# Patient Record
Sex: Female | Born: 1975
Health system: Southern US, Community
[De-identification: ages and names within clinical notes are randomized; demographics above are authoritative.]

## PROBLEM LIST (undated history)

## (undated) ENCOUNTER — Inpatient Hospital Stay (HOSPITAL_COMMUNITY): Payer: Self-pay

## (undated) DIAGNOSIS — I10 Essential (primary) hypertension: Secondary | ICD-10-CM

## (undated) DIAGNOSIS — R87619 Unspecified abnormal cytological findings in specimens from cervix uteri: Secondary | ICD-10-CM

## (undated) DIAGNOSIS — K589 Irritable bowel syndrome without diarrhea: Secondary | ICD-10-CM

## (undated) DIAGNOSIS — F419 Anxiety disorder, unspecified: Secondary | ICD-10-CM

## (undated) DIAGNOSIS — IMO0002 Reserved for concepts with insufficient information to code with codable children: Secondary | ICD-10-CM

## (undated) DIAGNOSIS — K5792 Diverticulitis of intestine, part unspecified, without perforation or abscess without bleeding: Secondary | ICD-10-CM

## (undated) DIAGNOSIS — Z8619 Personal history of other infectious and parasitic diseases: Secondary | ICD-10-CM

## (undated) DIAGNOSIS — E119 Type 2 diabetes mellitus without complications: Secondary | ICD-10-CM

## (undated) DIAGNOSIS — D219 Benign neoplasm of connective and other soft tissue, unspecified: Secondary | ICD-10-CM

## (undated) HISTORY — PX: COLONOSCOPY: SHX174

## (undated) HISTORY — PX: CYSTECTOMY: SUR359

## (undated) HISTORY — DX: Benign neoplasm of connective and other soft tissue, unspecified: D21.9

## (undated) HISTORY — PX: GYNECOLOGIC CRYOSURGERY: SHX857

## (undated) HISTORY — PX: CARPAL TUNNEL RELEASE: SHX101

## (undated) HISTORY — DX: Essential (primary) hypertension: I10

## (undated) HISTORY — DX: Reserved for concepts with insufficient information to code with codable children: IMO0002

## (undated) HISTORY — DX: Personal history of other infectious and parasitic diseases: Z86.19

## (undated) HISTORY — PX: WISDOM TOOTH EXTRACTION: SHX21

## (undated) HISTORY — PX: COLPOSCOPY: SHX161

## (undated) HISTORY — DX: Unspecified abnormal cytological findings in specimens from cervix uteri: R87.619

## (undated) HISTORY — DX: Irritable bowel syndrome, unspecified: K58.9

---

## 2000-04-28 ENCOUNTER — Other Ambulatory Visit: Admission: RE | Admit: 2000-04-28 | Discharge: 2000-04-28 | Payer: Self-pay | Admitting: Obstetrics and Gynecology

## 2003-08-13 ENCOUNTER — Encounter: Payer: Self-pay | Admitting: Emergency Medicine

## 2003-08-13 ENCOUNTER — Emergency Department (HOSPITAL_COMMUNITY): Admission: EM | Admit: 2003-08-13 | Discharge: 2003-08-13 | Payer: Self-pay | Admitting: Emergency Medicine

## 2003-11-24 DIAGNOSIS — Z8619 Personal history of other infectious and parasitic diseases: Secondary | ICD-10-CM

## 2003-11-24 HISTORY — DX: Personal history of other infectious and parasitic diseases: Z86.19

## 2004-02-06 ENCOUNTER — Other Ambulatory Visit: Admission: RE | Admit: 2004-02-06 | Discharge: 2004-02-06 | Payer: Self-pay | Admitting: Obstetrics and Gynecology

## 2004-08-29 ENCOUNTER — Ambulatory Visit (HOSPITAL_COMMUNITY): Admission: RE | Admit: 2004-08-29 | Discharge: 2004-08-29 | Payer: Self-pay | Admitting: Internal Medicine

## 2005-01-26 ENCOUNTER — Emergency Department (HOSPITAL_COMMUNITY): Admission: EM | Admit: 2005-01-26 | Discharge: 2005-01-26 | Payer: Self-pay | Admitting: *Deleted

## 2005-02-25 ENCOUNTER — Ambulatory Visit (HOSPITAL_COMMUNITY): Admission: RE | Admit: 2005-02-25 | Discharge: 2005-02-25 | Payer: Self-pay | Admitting: Pulmonary Disease

## 2005-03-09 ENCOUNTER — Other Ambulatory Visit: Admission: RE | Admit: 2005-03-09 | Discharge: 2005-03-09 | Payer: Self-pay | Admitting: Obstetrics and Gynecology

## 2005-06-19 ENCOUNTER — Emergency Department (HOSPITAL_COMMUNITY): Admission: EM | Admit: 2005-06-19 | Discharge: 2005-06-19 | Payer: Self-pay | Admitting: Emergency Medicine

## 2005-06-22 ENCOUNTER — Ambulatory Visit (HOSPITAL_COMMUNITY): Admission: RE | Admit: 2005-06-22 | Discharge: 2005-06-22 | Payer: Self-pay | Admitting: Orthopaedic Surgery

## 2005-06-25 ENCOUNTER — Encounter (HOSPITAL_COMMUNITY): Admission: RE | Admit: 2005-06-25 | Discharge: 2005-07-25 | Payer: Self-pay | Admitting: Orthopaedic Surgery

## 2005-08-24 ENCOUNTER — Ambulatory Visit (HOSPITAL_COMMUNITY): Admission: RE | Admit: 2005-08-24 | Discharge: 2005-08-24 | Payer: Self-pay | Admitting: Pulmonary Disease

## 2005-08-27 ENCOUNTER — Ambulatory Visit (HOSPITAL_COMMUNITY): Admission: RE | Admit: 2005-08-27 | Discharge: 2005-08-27 | Payer: Self-pay | Admitting: *Deleted

## 2005-09-04 ENCOUNTER — Ambulatory Visit (HOSPITAL_COMMUNITY): Admission: RE | Admit: 2005-09-04 | Discharge: 2005-09-04 | Payer: Self-pay | Admitting: Pulmonary Disease

## 2005-10-19 ENCOUNTER — Ambulatory Visit: Payer: Self-pay | Admitting: Family Medicine

## 2005-11-30 ENCOUNTER — Ambulatory Visit: Payer: Self-pay | Admitting: Family Medicine

## 2006-04-10 ENCOUNTER — Emergency Department (HOSPITAL_COMMUNITY): Admission: EM | Admit: 2006-04-10 | Discharge: 2006-04-10 | Payer: Self-pay | Admitting: Family Medicine

## 2006-05-17 ENCOUNTER — Ambulatory Visit: Payer: Self-pay | Admitting: Family Medicine

## 2006-05-19 ENCOUNTER — Ambulatory Visit (HOSPITAL_COMMUNITY): Admission: RE | Admit: 2006-05-19 | Discharge: 2006-05-19 | Payer: Self-pay | Admitting: Family Medicine

## 2006-09-28 ENCOUNTER — Ambulatory Visit: Payer: Self-pay | Admitting: Family Medicine

## 2006-10-26 ENCOUNTER — Ambulatory Visit: Payer: Self-pay | Admitting: Family Medicine

## 2007-04-06 ENCOUNTER — Ambulatory Visit (HOSPITAL_COMMUNITY): Admission: RE | Admit: 2007-04-06 | Discharge: 2007-04-06 | Payer: Self-pay | Admitting: Obstetrics and Gynecology

## 2007-04-06 ENCOUNTER — Encounter: Admission: RE | Admit: 2007-04-06 | Discharge: 2007-04-06 | Payer: Self-pay | Admitting: Obstetrics and Gynecology

## 2007-09-21 ENCOUNTER — Ambulatory Visit: Payer: Self-pay | Admitting: Family Medicine

## 2007-09-27 ENCOUNTER — Ambulatory Visit (HOSPITAL_COMMUNITY): Admission: RE | Admit: 2007-09-27 | Discharge: 2007-09-27 | Payer: Self-pay | Admitting: Family Medicine

## 2007-10-01 ENCOUNTER — Encounter: Payer: Self-pay | Admitting: Family Medicine

## 2007-10-01 LAB — CONVERTED CEMR LAB
BUN: 11 mg/dL (ref 6–23)
Basophils Absolute: 0 10*3/uL (ref 0.0–0.1)
Basophils Relative: 0 % (ref 0–1)
Blood Glucose, Fasting: 97 mg/dL
CO2: 23 meq/L (ref 19–32)
Calcium: 9.2 mg/dL (ref 8.4–10.5)
Chloride: 108 meq/L (ref 96–112)
Cholesterol: 140 mg/dL (ref 0–200)
Creatinine, Ser: 0.85 mg/dL (ref 0.40–1.20)
Eosinophils Absolute: 0.1 10*3/uL (ref 0.0–0.7)
Eosinophils Relative: 2 % (ref 0–5)
Glucose, Bld: 97 mg/dL (ref 70–99)
HCT: 43.5 % (ref 36.0–46.0)
HDL: 30 mg/dL — ABNORMAL LOW (ref >39)
Hemoglobin: 14.4 g/dL (ref 12.0–15.0)
LDL Cholesterol: 88 mg/dL (ref 0–99)
Lymphocytes Relative: 34 % (ref 12–46)
Lymphs Abs: 2.6 10*3/uL (ref 0.7–3.3)
MCHC: 33.1 g/dL (ref 30.0–36.0)
MCV: 87.2 fL (ref 78.0–100.0)
Monocytes Absolute: 0.6 10*3/uL (ref 0.2–0.7)
Monocytes Relative: 7 % (ref 3–11)
Neutro Abs: 4.2 10*3/uL (ref 1.7–7.7)
Neutrophils Relative %: 56 % (ref 43–77)
Platelets: 243 10*3/uL (ref 150–400)
Potassium: 4.4 meq/L (ref 3.5–5.3)
RBC count: 4.99 10*6/uL
RBC: 4.99 M/uL (ref 3.87–5.11)
RDW: 13.7 % (ref 11.5–14.0)
Sodium: 141 meq/L (ref 135–145)
TSH: 1.733 microintl units/mL
TSH: 1.733 microintl units/mL (ref 0.350–5.50)
Total CHOL/HDL Ratio: 4.7
Triglycerides: 108 mg/dL (ref <150)
VLDL: 22 mg/dL (ref 0–40)
WBC, blood: 7.5 10*3/uL
WBC: 7.5 10*3/uL (ref 4.0–10.5)

## 2007-11-03 ENCOUNTER — Ambulatory Visit: Payer: Self-pay | Admitting: Family Medicine

## 2007-12-19 ENCOUNTER — Encounter: Payer: Self-pay | Admitting: Family Medicine

## 2007-12-19 DIAGNOSIS — E669 Obesity, unspecified: Secondary | ICD-10-CM

## 2007-12-19 DIAGNOSIS — J019 Acute sinusitis, unspecified: Secondary | ICD-10-CM

## 2007-12-19 DIAGNOSIS — J209 Acute bronchitis, unspecified: Secondary | ICD-10-CM

## 2007-12-19 DIAGNOSIS — F411 Generalized anxiety disorder: Secondary | ICD-10-CM

## 2008-08-28 ENCOUNTER — Ambulatory Visit: Payer: Self-pay | Admitting: Gastroenterology

## 2008-08-28 ENCOUNTER — Encounter: Payer: Self-pay | Admitting: Family Medicine

## 2008-08-28 ENCOUNTER — Ambulatory Visit (HOSPITAL_COMMUNITY): Admission: RE | Admit: 2008-08-28 | Discharge: 2008-08-28 | Payer: Self-pay | Admitting: Gastroenterology

## 2008-08-30 ENCOUNTER — Ambulatory Visit (HOSPITAL_COMMUNITY): Admission: RE | Admit: 2008-08-30 | Discharge: 2008-08-30 | Payer: Self-pay | Admitting: Gastroenterology

## 2008-09-11 ENCOUNTER — Encounter: Payer: Self-pay | Admitting: Family Medicine

## 2009-02-20 ENCOUNTER — Ambulatory Visit: Payer: Self-pay | Admitting: Family Medicine

## 2009-02-20 DIAGNOSIS — H9209 Otalgia, unspecified ear: Secondary | ICD-10-CM | POA: Insufficient documentation

## 2009-02-21 ENCOUNTER — Encounter: Payer: Self-pay | Admitting: Family Medicine

## 2009-02-21 LAB — CONVERTED CEMR LAB
Basophils Absolute: 0 10*3/uL (ref 0.0–0.1)
Basophils Relative: 0 % (ref 0–1)
Eosinophils Absolute: 0.2 10*3/uL (ref 0.0–0.7)
Eosinophils Relative: 1 % (ref 0–5)
HCT: 43.3 % (ref 36.0–46.0)
Hemoglobin: 14.4 g/dL (ref 12.0–15.0)
Lymphocytes Relative: 28 % (ref 12–46)
Lymphs Abs: 3.3 10*3/uL (ref 0.7–4.0)
MCHC: 33.3 g/dL (ref 30.0–36.0)
MCV: 86.1 fL (ref 78.0–100.0)
Monocytes Absolute: 0.9 10*3/uL (ref 0.1–1.0)
Monocytes Relative: 7 % (ref 3–12)
Neutro Abs: 7.5 10*3/uL (ref 1.7–7.7)
Neutrophils Relative %: 63 % (ref 43–77)
Platelets: 267 10*3/uL (ref 150–400)
RBC: 5.03 M/uL (ref 3.87–5.11)
RDW: 13.5 % (ref 11.5–15.5)
TSH: 1.636 microintl units/mL (ref 0.350–4.500)
WBC: 11.8 10*3/uL — ABNORMAL HIGH (ref 4.0–10.5)

## 2009-03-20 ENCOUNTER — Ambulatory Visit (HOSPITAL_COMMUNITY): Admission: RE | Admit: 2009-03-20 | Discharge: 2009-03-20 | Payer: Self-pay | Admitting: General Surgery

## 2009-03-20 ENCOUNTER — Encounter (INDEPENDENT_AMBULATORY_CARE_PROVIDER_SITE_OTHER): Payer: Self-pay | Admitting: General Surgery

## 2009-12-04 ENCOUNTER — Ambulatory Visit: Payer: Self-pay | Admitting: Family Medicine

## 2009-12-04 DIAGNOSIS — R112 Nausea with vomiting, unspecified: Secondary | ICD-10-CM

## 2009-12-04 LAB — CONVERTED CEMR LAB
BUN: 8 mg/dL (ref 6–23)
Basophils Absolute: 0 10*3/uL (ref 0.0–0.1)
Basophils Relative: 1 % (ref 0–1)
CO2: 28 meq/L (ref 19–32)
Calcium: 8.9 mg/dL (ref 8.4–10.5)
Chloride: 105 meq/L (ref 96–112)
Creatinine, Ser: 0.78 mg/dL (ref 0.40–1.20)
Eosinophils Absolute: 0 10*3/uL (ref 0.0–0.7)
Eosinophils Relative: 0 % (ref 0–5)
Glucose, Bld: 105 mg/dL — ABNORMAL HIGH (ref 70–99)
HCT: 38.9 % (ref 36.0–46.0)
Hemoglobin: 13.2 g/dL (ref 12.0–15.0)
Lymphocytes Relative: 5 % — ABNORMAL LOW (ref 12–46)
Lymphs Abs: 0.4 10*3/uL — ABNORMAL LOW (ref 0.7–4.0)
MCHC: 34 g/dL (ref 30.0–36.0)
MCV: 87.3 fL (ref 78.0–100.0)
Monocytes Absolute: 0.4 10*3/uL (ref 0.1–1.0)
Monocytes Relative: 5 % (ref 3–12)
Neutro Abs: 6.2 10*3/uL (ref 1.7–7.7)
Neutrophils Relative %: 89 % — ABNORMAL HIGH (ref 43–77)
Platelets: 193 10*3/uL (ref 150–400)
Potassium: 4.2 meq/L (ref 3.5–5.3)
RBC: 4.46 M/uL (ref 3.87–5.11)
RDW: 13.3 % (ref 11.5–15.5)
Sodium: 140 meq/L (ref 135–145)
WBC: 7 10*3/uL (ref 4.0–10.5)

## 2010-03-03 ENCOUNTER — Ambulatory Visit: Payer: Self-pay | Admitting: Orthopedic Surgery

## 2010-03-03 DIAGNOSIS — M7580 Other shoulder lesions, unspecified shoulder: Secondary | ICD-10-CM

## 2010-03-03 DIAGNOSIS — M75120 Complete rotator cuff tear or rupture of unspecified shoulder, not specified as traumatic: Secondary | ICD-10-CM

## 2010-03-04 ENCOUNTER — Telehealth: Payer: Self-pay | Admitting: Orthopedic Surgery

## 2010-03-06 ENCOUNTER — Ambulatory Visit (HOSPITAL_COMMUNITY): Admission: RE | Admit: 2010-03-06 | Discharge: 2010-03-06 | Payer: Self-pay | Admitting: Orthopedic Surgery

## 2010-03-27 ENCOUNTER — Ambulatory Visit: Payer: Self-pay | Admitting: Orthopedic Surgery

## 2010-12-23 NOTE — Letter (Signed)
Summary: Out of Work  [facility]  [address]   [place], [place] [address]   Phone: [phone]  Fax: [phone]    December 04, 2009   Employee:  TACHE BOBST    To Whom It May Concern:   For Medical reasons, please excuse the above named employee from work for the following dates:  Start:   12/04/09  End:   12/09/09 to return with no restrictions  If you need additional information, please feel free to contact our office.         Sincerely,    Milus Mallick. Lodema Hong, MD

## 2010-12-23 NOTE — Assessment & Plan Note (Signed)
Summary: rt shoulder pain/NEEDS XRAY/UMR/frs   Vital Signs:  Patient profile:   35 year old female Menstrual status:  regular Height:      67 inches Weight:      180 pounds Pulse rate:   72 / minute Resp:     16 per minute  Vitals Entered By: Fuller Canada MD (March 03, 2010 9:33 AM)  Visit Type:  new patient Referring Provider:  self Primary Provider:  Kerri Perches, MD  CC:  right shoulder pain.  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  She states she can't lift overhead her pain is 5/10 comes and goes sharp at times dull at times.  Has progressed gradually over time.  She can't reach behind her she can't reach overhead.  She has catching before elevation and has numbness and tingling in the upper arm.  She also has some neck pain and pain in the trapezius area.  She can't sleep on her RIGHT arm.   Meds: Diethylpropion daily.      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 Cardiovascular:  Denies chest pain, palpitations, fainting, and murmurs. Respiratory:  Denies short of breath, wheezing, couch, tightness, pain on inspiration, and snoring . Gastrointestinal:  Denies heartburn, nausea, vomiting, diarrhea, constipation, and blood in your stools. Genitourinary:  Denies frequency, urgency, difficulty urinating, painful urination, flank  pain, and bleeding in urine. Neurologic:  Complains of numbness and tingling; denies unsteady gait, dizziness, tremors, and seizure; right hand. Musculoskeletal:  Complains of joint pain; denies swelling, instability, stiffness, redness, heat, and muscle pain. Endocrine:  Denies excessive thirst, exessive urination, and heat or cold intolerance. Psychiatric:  Denies nervousness, depression, anxiety, and hallucinations. Skin:  Denies changes in the skin, poor healing, rash, itching, and redness. HEENT:  Denies blurred or double vision, eye pain, redness, and watering. Immunology:  Denies seasonal allergies, sinus problems, and allergic to bee stings. Hemoatologic:  Denies easy bleeding and brusing.  Physical Exam  Additional Exam:  GEN: well developed, well nourished, normal grooming and hygiene, no deformity and normal body habitus.   CDV: pulses are normal, no edema, no erythema. no tenderness  Lymph: normal lymph nodes   Skin: no rashes, skin lesions or open sores   NEURO: normal coordination, reflexes, sensation.   Psyche: awake, alert and oriented. Mood normal   Gait: normal  LEFT shoulder inspection normal, range of motion full, strength normal, shoulder stable  RIGHT shoulder tenderness in the RIGHT trapezius muscle, full passive range of motion.  Active range of motion 150 of flexion.  Mild weakness in external rotation and in the supraspinatus tendon.the shoulder was stable  A positive Neer sign for impingement.  Hawkins sign was also positive.  cervical spine was nontender.  Full flexion extension no pain.  Had good external rotation to the LEFT and decreased to the RIGHT.  Compression test test was negative for radicular disease.  Spurling sign negative.   Impression &  Recommendations:  Problem # 1:  IMPINGEMENT SYNDROME (ICD-726.2) Assessment New  2 views of the glenohumeral joint   The glenohumeral joint space is normal. The bony anatomy is without bone  lesion. The acromion is a Type II  normal shoulder   Orders: New Patient Level IV (62952)  Problem # 2:  RUPTURE ROTATOR CUFF (ICD-727.61) Assessment: New  She either has a torn cuff or impingement syndrome  Would recommend some anti-inflammatories to start MRI to rule out cuff tear and then inject if no cuff tear along with physical therapy  Orders: New Patient Level IV (84132)  Patient Instructions: 1)  MRI right shoulder  2)  return in 2 weeks  3)  try some aleve or advil OTC

## 2010-12-23 NOTE — Assessment & Plan Note (Signed)
Summary: REVIEW MRI RESULTS/RT SHOULDER/[facility]/UMR/CAF   Visit Type:  MRI followup Referring Provider:  self Primary Provider:  Kerri Perches, MD  CC:  right shoulder pain.  History of Present Illness: The patient is here to repeat check her shoulder did an injection and followup on her MRI  Painful range of motion in the RIGHT shoulder MRI shows no tear  We did a subacromial injection RIGHT shoulder she is advised to anticipate an exercise program she would like to do at home  Allergies: No Known Drug Allergies   Impression & Recommendations:  Problem # 1:  IMPINGEMENT SYNDROME (ICD-726.2) Assessment Unchanged  Verbal consent obtained/The shoulder was injected with depomedrol 40mg /cc and sensorcaine .25% . There were no complications  Orders: Est. Patient Level III (43154) Joint Aspirate / Injection, Large (20610) Depo- Medrol 40mg  (J1030)  Patient Instructions: 1)  You have received an injection of cortisone today. You may experience increased pain at the injection site. Apply ice pack to the area for 20 minutes every 2 hours and take 2 xtra strength tylenol every 8 hours. This increased pain will usually resolve in 24 hours. The injection will take effect in 3-10 days.  2)  6 weeks  3)  re check

## 2010-12-23 NOTE — Miscellaneous (Signed)
Summary: PT order  PT order   Imported By: Cammie Sickle 03/27/2010 14:25:25  _____________________________________________________________________  External Attachment:    Type:   Image     Comment:   External Document

## 2010-12-23 NOTE — Assessment & Plan Note (Signed)
Summary: office visit   Vital Signs:  Patient profile:   35 year old female Menstrual status:  regular Height:      66.5 inches Weight:      201.2 pounds BMI:     32.10 Temp:     99.2 degrees F oral Pulse rate:   106 / minute Pulse rhythm:   regular Resp:     16 per minute BP sitting:   130 / 90  (right arm)  Vitals Entered By: Worthy Keeler LPN (December 04, 2009 2:09 PM)  Nutrition Counseling: Patient's BMI is greater than 25 and therefore counseled on weight management options.  Primary Care Provider:  Kerri Perches, MD   History of Present Illness: 3 day h/uo sinus pressure, headache sore throat and cough, sometimes producitive of yellow sputum, chills and low grade fever.Nasal drainagee is clear.  weak and achy.  front of chest hurts when she breathes  this morning at 3am pt std having nausea, she has vomitted 3 times, no loose stool,actually took dulcolax yesterday  because of constoipation. the pt reports a 50 pound weight loss since being on amed prescribed by her psyhiatrist, states it works as an excellent apetite suppressant, and she has been exercising regularly also. She no longer takes meds for anxiety.   Allergies: No Known Drug Allergies  Review of Systems      See HPI General:  Complains of chills, fatigue, fever, loss of appetite, malaise, weakness, and weight loss. Eyes:  Denies blurring and discharge. ENT:  Complains of postnasal drainage and sinus pressure. CV:  Denies chest pain or discomfort, palpitations, and swelling of feet. Resp:  Complains of cough and sputum productive. GI:  Complains of abdominal pain, nausea, and vomiting; denies constipation and diarrhea. GU:  Denies dysuria and urinary frequency. MS:  Denies joint pain and stiffness. Derm:  Denies itching and rash. Neuro:  Denies headaches and seizures. Psych:  Complains of anxiety; denies depression; chronic, on no meds. Endo:  Denies cold intolerance, excessive hunger, excessive  thirst, excessive urination, heat intolerance, polyuria, and weight change. Heme:  Denies abnormal bruising and bleeding. Allergy:  Denies hives or rash and itching eyes.  Physical Exam  General:  Well-developed,well-nourished,ill appearing, but in no cardiopulmonary distress. Alert, uncomfortable due to excessive nausea. HEENT: No facial asymmetry,  EOMI, frontal and maxillary sinus tenderness, TM's Clear, oropharynx  pink and moist.   Chest:decreased air entry , bilateral crackles , no wheezes CVS: S1, S2, No murmurs, No S3.   Abd: Soft, diffuse superficial tenderness, hyperactive bowel sounds  MS: Adequate ROM spine, hips, shoulders and knees.  Ext: No edema.   CNS: CN 2-12 intact, power tone and sensation normal throughout.   Skin: Intact, no visible lesions or rashes.  Psych: Good eye contact, normal affect.  Memory intact, anxious appearing..     Impression & Recommendations:  Problem # 1:  NAUSEA WITH VOMITING (ICD-787.01) Assessment Comment Only  Orders: Zofran 1mg . injection (Z6109) Admin of Therapeutic Inj  intramuscular or subcutaneous (60454) T-CBC w/Diff (09811-91478) Admin of Therapeutic Inj  intramuscular or subcutaneous (29562)  Problem # 2:  OBESITY, UNSPECIFIED (ICD-278.00) Assessment: Improved  Ht: 66.5 (12/04/2009)   Wt: 201.2 (12/04/2009)   BMI: 32.10 (12/04/2009)  Problem # 3:  BRONCHITIS, ACUTE (ICD-466.0) Assessment: Comment Only  Her updated medication list for this problem includes:    Zithromax Z-pak 250 Mg Tabs (Azithromycin) ..... Use as directed    Tessalon Perles 100 Mg Caps (Benzonatate) .Marland Kitchen... Take 1 capsule  by mouth three times a day  Problem # 4:  SINUSITIS, ACUTE (ICD-461.9) Assessment: Comment Only  Her updated medication list for this problem includes:    Zithromax Z-pak 250 Mg Tabs (Azithromycin) ..... Use as directed    Tessalon Perles 100 Mg Caps (Benzonatate) .Marland Kitchen... Take 1 capsule by mouth three times a day  Orders: T-Basic  Metabolic Panel [phone]) Rocephin  250mg  (J4782)  Complete Medication List: 1)  Citalopram Hydrobromide 10 Mg Tabs (Citalopram hydrobromide) .... Take 1 tablet by mouth once a day 2)  Diflucan 100 Mg Tabs (Fluconazole) .... Take 1 tablet by mouth once a day 3)  Zithromax Z-pak 250 Mg Tabs (Azithromycin) .... Use as directed 4)  Tessalon Perles 100 Mg Caps (Benzonatate) .... Take 1 capsule by mouth three times a day 5)  Fluconazole 150 Mg Tabs (Fluconazole) .... Take 1 tablet by mouth once a day as needed 6)  Promethazine Hcl 25 Mg Tabs (Promethazine hcl) .... Take 1 tablet by mouth three times a day as needed  Patient Instructions: 1)  F/u as needed. 2)  You are being treated for acute sinusitis and bronchitis. 3)  you also have symptoms of early stomach virus. 4)  You have received rocephin and Zofran. 5)  meds are being sent to your pharmacy. 6)     7)  the main problem with gastroenteritis is dehydration. Drink plenty of fluids and take solids as you feel better. If you are unable to keep anything down and/or you show signs of dehydration(dry/cracked lips, lack of tears, not urinating, very sleepy), call our office. Prescriptions: PROMETHAZINE HCL 25 MG TABS (PROMETHAZINE HCL) Take 1 tablet by mouth three times a day as needed  #30 x 0   Entered and Authorized by:   Syliva Overman MD   Signed by:   Syliva Overman MD on 12/04/2009   Method used:   Electronically to        [facility] (retail)       [address]       [place], [place]  [address]       Ph: [phone] or [phone]       Fax: [phone]   RxID:   [id number] FLUCONAZOLE 150 MG TABS (FLUCONAZOLE) Take 1 tablet by mouth once a day as needed  #3 x 0   Entered and Authorized by:   Syliva Overman MD   Signed by:   Syliva Overman MD on 12/04/2009   Method used:   Electronically to        [facility] (retail)       [address]       [place], [place]  [address]       Ph: [phone] or [phone]       Fax: [phone]   RxID:   [id number] TESSALON PERLES 100 MG CAPS (BENZONATATE) Take 1 capsule by mouth three times a day  #30 x 0   Entered and Authorized by:   Syliva Overman MD   Signed by:   Syliva Overman MD on 12/04/2009   Method used:   Electronically to        [facility] (retail)       [address]       [place], [place]  [address]       Ph: [phone] or [phone]  Fax: [phone]   RxID:   [id number] ZITHROMAX Z-PAK 250 MG TABS (AZITHROMYCIN) Use as directed  #6 x 0   Entered and Authorized by:   Syliva Overman MD   Signed by:   Syliva Overman MD on 12/04/2009   Method used:   Electronically to        [facility] (retail)       [address]       [place], [place]  [address]       Ph: [phone] or [phone]       Fax: [phone]   RxID:   [id number]    Medication Administration  Injection # 1:    Medication: Zofran 1mg . injection    Diagnosis: NAUSEA WITH VOMITING (ICD-787.01)    Route: IM    Site: RUOQ gluteus    Exp Date: 6/11    Lot #: [id number]    Mfr: [email or web address]    Comments: zofran 4mg  given    Patient tolerated injection without complications    Given by: Worthy Keeler LPN (December 04, 2009 1:24 PM)  Injection # 2:    Medication: Rocephin  250mg     Diagnosis: SINUSITIS, ACUTE (ICD-461.9)    Route: IM    Site: L deltoid    Exp Date: 9/12    Lot #: [id number]    Mfr: [email or web address]    Comments: rocephin 500mg  given    Patient tolerated injection without complications    Given by: Worthy Keeler LPN (December 04, 2009 2:08 PM)  Orders Added: 1)  Zofran 1mg . injection [J2405] 2)  Admin of Therapeutic Inj  intramuscular or subcutaneous [96372] 3)  Est. Patient Level IV [63016] 4)  T-Basic Metabolic Panel [[id number]] 5)  T-CBC w/Diff [[id number]] 6)  Rocephin  250mg  [J0696] 7)  Admin  of Therapeutic Inj  intramuscular or subcutaneous [32202]

## 2010-12-23 NOTE — Progress Notes (Signed)
Summary: MRI appointment.  Phone Note Outgoing Call   Call placed by: Waldon Reining,  March 04, 2010 11:39 AM Call placed to: Patient Action Taken: Appt scheduled Summary of Call: I called to give the patient her MRI appointment at [facility] on 03-06-10 at 3:30. Patient has [facility] UMR, no precert is needed. She will follow up back here to go over her results.

## 2010-12-23 NOTE — Letter (Signed)
Summary: History form  History form   Imported By: Jacklynn Ganong 03/04/2010 13:43:03  _____________________________________________________________________  External Attachment:    Type:   Image     Comment:   External Document

## 2010-12-27 ENCOUNTER — Inpatient Hospital Stay (INDEPENDENT_AMBULATORY_CARE_PROVIDER_SITE_OTHER)
Admission: RE | Admit: 2010-12-27 | Discharge: 2010-12-27 | Disposition: A | Discharge status: Home / Self Care | Payer: Commercial Managed Care - PPO | Source: Ambulatory Visit | Attending: Family Medicine | Admitting: Family Medicine

## 2010-12-27 DIAGNOSIS — N390 Urinary tract infection, site not specified: Secondary | ICD-10-CM

## 2010-12-27 LAB — POCT PREGNANCY, URINE: Preg Test, Ur: NEGATIVE

## 2010-12-30 LAB — URINE CULTURE
Colony Count: >=100000
Culture  Setup Time: 201202050109

## 2011-01-01 LAB — POCT URINALYSIS DIPSTICK
Hgb urine dipstick: NEGATIVE
Nitrite: POSITIVE — AB
Protein, ur: 30 mg/dL — AB
Specific Gravity, Urine: 1.015 (ref 1.005–1.030)
Urine Glucose, Fasting: 100 mg/dL — AB
Urobilinogen, UA: 4 mg/dL — ABNORMAL HIGH (ref 0.0–1.0)
pH: 7 (ref 5.0–8.0)

## 2011-03-04 LAB — BASIC METABOLIC PANEL
BUN: 9 mg/dL (ref 6–23)
CO2: 26 mEq/L (ref 19–32)
Calcium: 9.2 mg/dL (ref 8.4–10.5)
Chloride: 108 mEq/L (ref 96–112)
Creatinine, Ser: 0.69 mg/dL (ref 0.4–1.2)
GFR calc Af Amer: >60 mL/min (ref >60)
GFR calc non Af Amer: >60 mL/min (ref >60)
Glucose, Bld: 108 mg/dL — ABNORMAL HIGH (ref 70–99)
Potassium: 4.1 mEq/L (ref 3.5–5.1)
Sodium: 139 mEq/L (ref 135–145)

## 2011-03-04 LAB — CBC
HCT: 41.8 % (ref 36.0–46.0)
Hemoglobin: 14.7 g/dL (ref 12.0–15.0)
MCHC: 35.2 g/dL (ref 30.0–36.0)
MCV: 87.3 fL (ref 78.0–100.0)
Platelets: 264 10*3/uL (ref 150–400)
RBC: 4.79 MIL/uL (ref 3.87–5.11)
RDW: 13.2 % (ref 11.5–15.5)
WBC: 8.7 10*3/uL (ref 4.0–10.5)

## 2011-03-04 LAB — HCG, QUANTITATIVE, PREGNANCY: hCG, Beta Chain, Quant, S: <2 m[IU]/mL (ref <5)

## 2011-04-07 NOTE — H&P (Signed)
NAMESEMA, STANGLER            ACCOUNT NO.:  [id number]   MEDICAL RECORD NO.:  [id number]          PATIENT TYPE:  AMB   LOCATION:  DAY                           FACILITY:  [facility]   PHYSICIAN:  Kassie Mends, M.D.      DATE OF BIRTH:  29-Aug-1976   DATE OF ADMISSION:  08/28/2008  DATE OF DISCHARGE:  LH                              HISTORY & PHYSICAL   PRIMARY CARE PHYSICIAN:  Dr. Lodema Hong.   REASON FOR CONSULTATION:  Proctalgia and hematochezia.   HISTORY OF PRESENT ILLNESS:  Ms. [name] is a 35 year old Caucasian  female who has had a 2-month history of constipation and straining with  stool.  She notes hard stools and dry stools.  She has been noticing  intermittent small-volume hematochezia on the toilet paper with wiping.  She has noted problems with hygiene.  She denies any anorexia and has  intentionally lost about 60 pounds in the last 8 months however, she has  gained about 10 pounds back.  She denies any heartburn or indigestion.  Denies any nausea or vomiting.  Denies any dysphagia or odynophagia.  She has tried fiber bars for her constipation.  She has tried Imodium  previously when she would have diarrhea.  She has a past history of IBS  and has had very irregular bowel movements, especially when she goes out  to eat.  She has had a history of postprandial diarrhea with anywhere  from three to four loose stools per day at times.  She has tried senna  and MiraLax and stool softeners, but admits to not taking any of these  medications on a regular basis.  She has had some low abdominal cramps  which are usually made better with defecation.   PAST MEDICAL AND SURGICAL HISTORY:  1. 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  2. She has history of IBS.  3. She has history of a possible PCOS.  4. She had Salmonella in 2001 and Campylobacter in 2004.  5. She has had  wisdom teeth extraction.   CURRENT MEDICATIONS:  Hydrocortisone suppositories   ALLERGIES:  No known drug allergies.   FAMILY HISTORY:  The mother is being worked up for possible Crohn  disease by R. Roetta Sessions, M.D.  She had a colonoscopy last week and  had have ulcers at her ICV and has history of ischemic colitis.  Father  at age 65 has history of AFib.  She has one healthy sister.  One brother  with Prader-Willi syndrome and diabetes mellitus.   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.  She  lives alone.  She denies any tobacco, alcohol or drug use.   REVIEW OF SYSTEMS:  See HPI.  GYN:  Last menstrual period was August 04, 2008.  More recently she has been regular, but has history of  irregular cycles.  Otherwise negative review of systems.   PHYSICAL EXAMINATION:  VITAL SIGNS:  Weight 225 pounds,  height 67  inches, temperature 98 degrees, blood pressure 130/80 and pulse 88.  GENERAL:  She is an obese Caucasian female who is alert, normal,  oriented and quite anxious in no acute distress.  She is accompanied by  her mother today.  HEENT.  Sclerae clear, nonicteric.  Conjunctivae are pink.  Oropharynx  is pink and moist without any lesions.  NECK:  Supple without mass or thyromegaly.  CHEST:  Heart regular rate and rhythm.  Normal S1-S2 without murmurs,  clicks, rubs or gallops.  LUNGS:  Clear to auscultation bilaterally.  ABDOMEN:  Protuberant with positive bowel sounds x4.  No bruits  auscultated.  She does have several freely mobile 1-2 cm palpable  round/oval non-fixed lesions in the lower pelvis felt to be possible  lipomas.  RECTAL:  She does have a large external fissure.  Internal exam reveals  a palpable fullness approximately 3-5 cm from sphincter.  It is  nontender.  A small amount of stool was obtained from the vault which is  Hemoccult positive  EXTREMITIES:  Without clubbing or edema.   IMPRESSION:  Ms. [name] is a 35 year old  Caucasian female with history  of irritable bowel syndrome.  She has history of alternating  constipation and diarrhea.  More recently approximately 2 months ago she  developed significant constipation and the sensation of straining and  obstipation with defecation.  She has had significant intentional weight  loss.  She is also noticed intermittent hematochezia.  She does have  external fissure which could be the culprit of her hematochezia,  however, I do feel that she is going to require a flexible sigmoidoscopy  to further evaluate her hematochezia and possible rectal lesion.  I have  discussed this case with Kassie Mends, M.D. and our plan of care is  outlined below.   PLAN:  1. She will have a flexible sigmoidoscopy with Dr. Cira Servant.  I have      discussed the procedure including risks, benefits to include, but      are limited to bleeding, infection, perforation and drug reaction      if she decides to have sedation.  2. She is going to [facility] endoscopy now for procedure.  3. She should continue Anusol suppositories.  4. Further recommendations pending procedure.      Lorenza Burton, N.P.      Kassie Mends, M.D.  Electronically Signed    KJ/MEDQ  D:  08/28/2008  T:  08/28/2008  Job:  [id number]   cc:   Milus Mallick. Lodema Hong, M.D.  Fax: [phone]

## 2011-04-07 NOTE — Op Note (Signed)
NAMEHERMELA, Cynthia Ochoa            ACCOUNT NO.:  [id number]   MEDICAL RECORD NO.:  [id number]          PATIENT TYPE:  AMB   LOCATION:  DAY                           FACILITY:  [facility]   PHYSICIAN:  Kassie Mends, M.D.      DATE OF BIRTH:  04-10-1976   DATE OF PROCEDURE:  08/28/2008  DATE OF DISCHARGE:                               OPERATIVE REPORT   PROCEDURE:  Sigmoidoscopy.   INDICATION FOR EXAMINATION:  [name] is a 35 year old female who  presents with history of anal fissures, hemorrhoid, and irritable bowel  syndrome.  She presented with rectal bleeding.  On physical exam, bulge  in her rectum and anterior bulge was noted on palpation.   FINDINGS:  1. Two small anal fissures seen in the anal canal.  No internal or      external hemorrhoids appreciated.  2. Normal retroflexed view of the rectum.  Normal sigmoidoscopy      without evidence of polyps, mass, inflammatory changes, or      diverticular AVMs.   DIAGNOSES:  1. Rectal bleeding secondary to anal fissures.  2. Rectal bulge likely secondary to enlarged uterus.   RECOMMENDATIONS:  1. The patient should avoid aspirin for 14 days.  2. She should drink 6-8 cups of water daily.  3. Anusol-HC 1 per rectum twice daily for 10 days.  4. Milk of magnesia 2 tablets twice daily.  5. Fiber supplement once daily.  In 3-4 weeks, she should begin a high-      fiber diet and she will be given a handout on high-fiber diet.  6. Follow up appointment in 4 weeks with Dr. Cira Servant RE: anal fissure.      She may need to consider a fissurectomy.   MEDICATIONS:  1. Demerol 100 mg IV.  2. Versed 4 mg IV.   PROCEDURE TECHNIQUE:  Physical exam was performed.  Informed consent was  obtained from the patient after explaining the benefits, risks, and  alternatives to the procedure.  The patient was connected to the monitor  and placed in left lateral position.  Continuous oxygen was provided by  nasal cannula and IV medicine administered  through an indwelling  cannula.  After administration of sedation and rectal exam, the  patient's rectum was intubated  and the scope was advanced under direct visualization to 40-50 cm beyond  the anal verge.  The scope was removed slowly by carefully examining the  color, texture, anatomy, and integrity of the mucosa on the way out.  The patient recovered in endoscopy and discharged home in satisfactory  condition.      Kassie Mends, M.D.  Electronically Signed     SM/MEDQ  D:  08/28/2008  T:  08/29/2008  Job:  [id number]   cc:   Milus Mallick. Lodema Hong, M.D.  Fax: [phone]

## 2011-04-07 NOTE — Op Note (Signed)
NAMEGERMANI, Cynthia Ochoa            ACCOUNT NO.:  [id number]   MEDICAL RECORD NO.:  [id number]          PATIENT TYPE:  AMB   LOCATION:  DAY                           FACILITY:  [facility]   PHYSICIAN:  Dalia Heading, M.D.  DATE OF BIRTH:  Nov 28, 1975   DATE OF PROCEDURE:  03/20/2009  DATE OF DISCHARGE:                               OPERATIVE REPORT   PREOPERATIVE DIAGNOSIS:  Sebaceous cyst, abdominal wall.   POSTOPERATIVE DIAGNOSES:  Sebaceous cyst, abdominal wall; granuloma.   PROCEDURE:  Excision of cyst, abdominal wall, 2.5 cm.   SURGEON:  Dalia Heading, MD   ANESTHESIA:  General.   INDICATIONS:  The patient is a 35 year old white female, who presents  with a draining sebaceous cyst on the abdominal wall.  It is in the  suprapubic region.  It has been present for many months and continues to  drain serosanguineous fluid.  The risks and benefits of the procedure  were fully explained to the patient, gave informed consent.   PROCEDURE NOTE:  The patient was placed in supine position.  After  general anesthesia was administered, the suprapubic region was prepped  and draped using the usual sterile technique with Betadine.  Surgical  site confirmation was performed.   Just the left of the suprapubic region along a skin fold, an indurated  area was present.  Elliptical incision was made around this region and  skin along with subcutaneous tissue was excised.  Care was taken not to  get into the granuloma.  The specimen sent to Pathology for further  examination.  Bleeding was controlled using Bovie electrocautery.  The  skin was instilled with 0.5 mL Sensorcaine.  The subcutaneous layer was  reapproximated using 4-0 Vicryl interrupted sutures.  The skin was  closed using a 4-0 Vicryl subcuticular suture.  Dermabond was then  applied.   All tape and needle counts correct at the end of procedure.  The patient  was awakened and transferred to PACU in stable condition.   COMPLICATIONS:  None.   SPECIMEN:  Granuloma, abdominal wall.   ESTIMATED BLOOD LOSS:  Minimal.      Dalia Heading, M.D.  Electronically Signed     MAJ/MEDQ  D:  03/20/2009  T:  03/20/2009  Job:  [id number]

## 2011-04-07 NOTE — H&P (Signed)
NAME:  Cynthia Ochoa, Cynthia Ochoa            ACCOUNT NO.:  [id number]   MEDICAL RECORD NO.:  [id number]          PATIENT TYPE:  AMB   LOCATION:  DAY                           FACILITY:  [facility]   PHYSICIAN:  Dalia Heading, M.D.  DATE OF BIRTH:  Apr 28, 1976   DATE OF ADMISSION:  DATE OF DISCHARGE:  LH                              HISTORY & PHYSICAL   CHIEF COMPLAINT:  Recurrent sebaceous cyst, trunk.   HISTORY OF PRESENT ILLNESS:  The patient is a 35 year old white female  who presents with recurrent sebaceous cyst on the lower aspect of her  abdomen.  It has been present for many months and continues to drain  serosanguineous fluid.  The patient now comes to the operating room for  excision of the cyst.   PAST MEDICAL HISTORY:  Unremarkable.   PAST SURGICAL HISTORY:  Unremarkable.   CURRENT MEDICATIONS:  None.   ALLERGIES:  No known drug allergies.   REVIEW OF SYSTEMS:  Noncontributory.   PHYSICAL EXAMINATION:  GENERAL:  The patient is a well-developed, well-  nourished, white female in no acute distress.  LUNGS:  Clear to auscultation with equal breath sounds bilaterally.  HEART:  Reveals regular rate and rhythm without S3, S4, or murmurs.  ABDOMEN:  Soft, nontender, nondistended.  A draining sinus with  induration is noted in the left lower portion of the anterior abdominal  wall.   IMPRESSION:  Sebaceous cyst, recurrent, abdominal wall.   PLAN:  The patient is scheduled for excision of the sebaceous cyst,  abdominal wall, on March 20, 2009.  The risks and benefits of the  procedure including bleeding, infection, and recurrence of the cyst were  fully explained to the patient, who gave informed consent.      Dalia Heading, M.D.  Electronically Signed     MAJ/MEDQ  D:  03/12/2009  T:  03/12/2009  Job:  [id number]   cc:   [facility]  Fax: [phone]

## 2011-04-10 NOTE — Procedures (Signed)
NAMEFAYETTA, Cynthia Ochoa            ACCOUNT NO.:  [id number]   MEDICAL RECORD NO.:  [id number]          PATIENT TYPE:  OUT   LOCATION:  RESP                          FACILITY:  [facility]   PHYSICIAN:  Edward L. Juanetta Gosling, M.D.DATE OF BIRTH:  1976-03-05   DATE OF PROCEDURE:  08/27/2005  DATE OF DISCHARGE:                              PULMONARY FUNCTION TEST   RESULTS:  1.  Spirometry shows no ventilator defect.  2.  Lung volumes are normal.  3.  Diffusion capacity of carbon monoxide (DLCO) is normal.  4.  Arterial blood gasses are normal.      Edward L. Juanetta Gosling, M.D.  Electronically Signed     ELH/MEDQ  D:  08/27/2005  T:  08/28/2005  Job:  [id number]

## 2011-04-10 NOTE — Procedures (Signed)
NAMEANNORA, Cynthia Ochoa            ACCOUNT NO.:  [id number]   MEDICAL RECORD NO.:  [id number]          PATIENT TYPE:  OUT   LOCATION:  RAD                           FACILITY:  [facility]   PHYSICIAN:  Richard A. Alanda Amass, M.D.DATE OF BIRTH:  1975-12-12   DATE OF PROCEDURE:  09/04/2005  DATE OF DISCHARGE:                                  ECHOCARDIOGRAM   INDICATIONS:  A 35 year old woman who has a history of shortness of breath,  dyspnea on exertion and 2D echocardiogram was ordered to assess LV function.   The echocardiogram  was technically adequate.   M-MODE TRACINGS:  The aorta order is normal at 2.7 cm.  The aortic valve  opens normally.  Three leaflets were visualized.  Structure and function  appears normal.  There is no aortic regurgitation or aortic stenosis.  Tricuspid aortic valve.   Left atrium is slightly enlarged at 4.2 cm.  There are no clots seen.  The  patient was in sinus rhythm during the procedure.   Intraventricular septum is mildly thickened to 1.3 cm.  LVPW is normal and  1.1 cm compatible with very minimal asymmetric hypertrophy.  There is normal  septal and posterior wall contraction pattern.   Mitral valve opens normally.  There is no valve prolapse and leaflets appear  normal.  There is trace mitral regurgitation.   The tricuspid valve appears normal and there is trace tricuspid  regurgitation.   Left ventricular internal dimensions are within normal limits.  LVIDD equal  4.4 cm, LVISD equal 3.2 cm.  There is normal thickening of all visualized  myocardial segments.  There is no segmental wall motion abnormality and  normal estimated EF of greater than 55%. LV inflow signal was normal.   Right ventricle is normal.   There is no pericardial effusion.   Essentially normal 2D echocardiogram with normal systolic and diastolic LV  parameters.  No valvular abnormality.     Richard A. Alanda Amass, M.D.  Electronically Signed    RAW/MEDQ  D:  09/07/2005   T:  09/07/2005  Job:  [id number]

## 2011-04-10 NOTE — Op Note (Signed)
NAMELUCITA, Cynthia Ochoa            ACCOUNT NO.:  [id number]   MEDICAL RECORD NO.:  [id number]          PATIENT TYPE:  AMB   LOCATION:  DAY                           FACILITY:  [facility]   PHYSICIAN:  Lionel December, M.D.    DATE OF BIRTH:  08-May-1976   DATE OF PROCEDURE:  08/29/2004  DATE OF DISCHARGE:                                 OPERATIVE REPORT   PROCEDURE:  Total colonoscopy with terminal ileoscopy.   Cynthia Ochoa is a 35 year old Caucasian female R.N. who has had irregular bowel  movements for about two years.  She nearly has diarrhea, although at times  she has to strain in order to have a BM.  She has hematochezia usually a  couple of times a month.  She does give a history of having a Salmonella  infection back in 2001 and Campylobacter infection last year.  It is felt  that she has IBS.  She is undergoing colonoscopy to make sure she does not  have colitis or IBD.  The procedure and risks were reviewed with the  patient, and informed consent was obtained.   PREMEDICATION:  Demerol 50 mg IV, Versed 10 mg IV.   FINDINGS:  Procedure performed in endoscopy suite.  The patient's vital  signs and O2 saturation were monitored during the procedure and remained  stable.  The patient was placed in the left lateral recumbent position and  rectal examination performed.  No abnormality noted on external or digital  exam.  The Olympus video scope was placed in the rectum and advanced under  vision into sigmoid colon and beyond.  Her preparation was excellent.  She  had some liquid stool in the right colon, which was easily suctioned out.  Cecal landmarks, i.e., appendiceal orifice and ileocecal valve, were well-  seen and picture taken for the record.  The TI was also examined for at  least 15-20 cm and was normal.  A picture was taken for the record.  As the  scope was withdrawn, colonic mucosa was once again carefully examined and  mucosa was normal throughout.  Rectal mucosa similarly was  normal.  The  scope was retroflexed to examine the anorectal junction, and small  hemorrhoids were noted below the dentate line.  The endoscope was  straightened and withdrawn.  The patient tolerated the procedure well.   FINAL DIAGNOSES:  1.  Normal terminal ileum.  2.  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  3.  Suspect she has irritable bowel syndrome and the rectal bleeding is      secondary to hemorrhoids.   RECOMMENDATIONS:  1.  A high-fiber diet.  2.  Citrucel one tablespoonful daily.  3.  Levbid one tablet p.o. q.a.m., a prescription given for 30 with five      refills.  4.  Will review response to therapy in a few weeks and make changes as      appropriate.     Cynthia Ochoa   NR/MEDQ  D:  08/29/2004  T:  08/29/2004  Job:  [id number]   cc:  Edward L. Juanetta Gosling, M.D.  [address]  [place]  [place] [address]  Fax: [phone]

## 2011-06-10 ENCOUNTER — Inpatient Hospital Stay: Admit: 2011-06-10 | Payer: Self-pay | Admitting: Obstetrics and Gynecology

## 2011-09-17 ENCOUNTER — Other Ambulatory Visit: Payer: Self-pay | Admitting: Obstetrics and Gynecology

## 2012-01-19 ENCOUNTER — Telehealth: Payer: Self-pay | Admitting: Gastroenterology

## 2012-01-19 MED ORDER — NITROFURANTOIN MACROCRYSTAL 100 MG PO CAPS: ORAL_CAPSULE | ORAL | Status: DC

## 2012-01-19 NOTE — Telephone Encounter (Signed)
PT C/O DYSURIA. MACRODNATIN 100 MG BID FOR 5 DAYS.

## 2012-04-06 ENCOUNTER — Other Ambulatory Visit: Payer: Self-pay | Admitting: Obstetrics and Gynecology

## 2012-07-04 ENCOUNTER — Ambulatory Visit (INDEPENDENT_AMBULATORY_CARE_PROVIDER_SITE_OTHER): Payer: Commercial Managed Care - PPO | Admitting: Orthopedic Surgery

## 2012-07-04 ENCOUNTER — Ambulatory Visit (INDEPENDENT_AMBULATORY_CARE_PROVIDER_SITE_OTHER): Payer: Commercial Managed Care - PPO

## 2012-07-04 ENCOUNTER — Encounter: Payer: Self-pay | Admitting: Orthopedic Surgery

## 2012-07-04 VITALS — BP 126/78 | Ht 66.5 in | Wt 234.0 lb

## 2012-07-04 DIAGNOSIS — M25519 Pain in unspecified shoulder: Secondary | ICD-10-CM

## 2012-07-04 DIAGNOSIS — M75102 Unspecified rotator cuff tear or rupture of left shoulder, not specified as traumatic: Secondary | ICD-10-CM

## 2012-07-04 DIAGNOSIS — M67919 Unspecified disorder of synovium and tendon, unspecified shoulder: Secondary | ICD-10-CM

## 2012-07-04 DIAGNOSIS — M25512 Pain in left shoulder: Secondary | ICD-10-CM

## 2012-07-04 NOTE — Patient Instructions (Signed)
You have received a steroid shot. 15% of patients experience increased pain at the injection site with in the next 24 hours. This is best treated with ice and tylenol extra strength 2 tabs every 8 hours. If you are still having pain please call the office.   Home exercises  

## 2012-07-05 ENCOUNTER — Encounter: Payer: Self-pay | Admitting: Orthopedic Surgery

## 2012-07-05 DIAGNOSIS — M75102 Unspecified rotator cuff tear or rupture of left shoulder, not specified as traumatic: Secondary | ICD-10-CM | POA: Insufficient documentation

## 2012-07-05 NOTE — Progress Notes (Signed)
  Subjective:    Patient ID: Cynthia Ochoa, female    DOB: 05/31/1976, 36 y.o.   MRN: [id number]  Shoulder Injury  The incident occurred at home. The left shoulder is affected. The incident occurred more than 1 week ago. There was no injury mechanism. The quality of the pain is described as aching. The pain does not radiate. The pain is moderate. Pertinent negatives include no chest pain, muscle weakness, numbness or tingling. The symptoms are aggravated by movement and overhead lifting. She has tried nothing for the symptoms.      Review of Systems  Cardiovascular: Negative for chest pain.  Neurological: Negative for tingling and numbness.  All other systems reviewed and are negative.       Objective:   Physical Exam  Constitutional: She is oriented to person, place, and time. She appears well-developed and well-nourished.  Neck: Normal range of motion. No JVD present.  Cardiovascular: Intact distal pulses.   Lymphadenopathy:    She has no cervical adenopathy.  Neurological: She is alert and oriented to person, place, and time. She has normal reflexes. She displays normal reflexes. She exhibits normal muscle tone.  Skin: Skin is warm and dry.  Psychiatric: She has a normal mood and affect. Her behavior is normal. Judgment and thought content normal.  Right Shoulder Exam  Right shoulder exam is normal.  Tenderness  The patient is experiencing no tenderness.    Range of Motion  The patient has normal right shoulder ROM.  Muscle Strength  The patient has normal right shoulder strength.  Tests  Apprehension: negative Impingement: negative  Other  Erythema: absent Scars: absent Sensation: normal Pulse: present   Left Shoulder Exam   Tenderness  Left shoulder tenderness location: periacromial with crepitation   Range of Motion  Left shoulder active abduction: 90.  Passive Abduction: 110  Extension: 30  Left shoulder forward flexion: A=120 p=160.  External  Rotation: 40  Left shoulder internal rotation 0 degrees: T10 vs T7.   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 The shoulder was prepped withalcohol and the skin was anesthetized. A 20 gauge needle was advanced into the subacromial space through posterior approach without difficulty  The space was then injected with 3 ml 1% lidocaine and 1 ml of depomedrol. The injection site was cleansed with isopropyl alcohol and a dressing was applied.  Complications:  None; patient tolerated the procedure well.

## 2012-09-01 LAB — OB RESULTS CONSOLE GC/CHLAMYDIA
Chlamydia: NEGATIVE
Gonorrhea: NEGATIVE

## 2012-09-01 LAB — OB RESULTS CONSOLE RPR: RPR: NONREACTIVE

## 2012-09-01 LAB — OB RESULTS CONSOLE ANTIBODY SCREEN: Antibody Screen: NEGATIVE

## 2012-09-01 LAB — OB RESULTS CONSOLE RUBELLA ANTIBODY, IGM: Rubella: IMMUNE

## 2012-09-26 ENCOUNTER — Other Ambulatory Visit: Payer: Self-pay | Admitting: Obstetrics and Gynecology

## 2012-10-05 ENCOUNTER — Other Ambulatory Visit: Payer: Self-pay | Admitting: Obstetrics and Gynecology

## 2012-11-23 NOTE — L&D Delivery Note (Signed)
Operative Delivery Note At 7:50 PM a viable female was delivered via .  Presentation: vertex; Position: Occiput,, Posterior; Station: +2.  Verbal consent: obtained from patient.  Risks and benefits discussed in detail.  Risks include, but are not limited to the risks of anesthesia, bleeding, infection, damage to maternal tissues, fetal cephalhematoma.  There is also the risk of inability to effect vaginal delivery of the head, or shoulder dystocia that cannot be resolved by established maneuvers, leading to the need for emergency cesarean section.  No popoff  APGAR:9 9   Placenta status: delivered spontaneously with 3 vessel cord  Cord:  with the following complications: .  Cord pH: not done  Anesthesia: Epidural  Instruments: mushroom Episiotomy: midline Lacerations: none Suture Repair: 3.0 chromic Est. Blood Loss (mL): 300  Mom to postpartum.  Baby to nursery-stable.  Rocky Rishel L 04/07/2013, 8:05 PM

## 2012-12-27 ENCOUNTER — Other Ambulatory Visit: Payer: Self-pay | Admitting: Dermatology

## 2013-01-11 ENCOUNTER — Other Ambulatory Visit (HOSPITAL_COMMUNITY)
Admission: RE | Admit: 2013-01-11 | Discharge: 2013-01-11 | Disposition: A | Discharge status: Home / Self Care | Payer: 59 | Source: Ambulatory Visit | Attending: Gynecologic Oncology | Admitting: Gynecologic Oncology

## 2013-01-11 ENCOUNTER — Ambulatory Visit: Payer: 59 | Attending: Gynecologic Oncology | Admitting: Gynecologic Oncology

## 2013-01-11 ENCOUNTER — Encounter: Payer: Self-pay | Admitting: Gynecologic Oncology

## 2013-01-11 VITALS — BP 142/78 | HR 80 | Temp 98.1°F | Resp 16 | Ht 67.0 in | Wt 252.8 lb

## 2013-01-11 DIAGNOSIS — O99891 Other specified diseases and conditions complicating pregnancy: Secondary | ICD-10-CM | POA: Insufficient documentation

## 2013-01-11 DIAGNOSIS — Z01419 Encounter for gynecological examination (general) (routine) without abnormal findings: Secondary | ICD-10-CM | POA: Insufficient documentation

## 2013-01-11 DIAGNOSIS — R87613 High grade squamous intraepithelial lesion on cytologic smear of cervix (HGSIL): Secondary | ICD-10-CM | POA: Insufficient documentation

## 2013-01-11 NOTE — Progress Notes (Signed)
Consult Note: Gyn-Onc  Cynthia Ochoa 37 y.o. female  CC:  Chief Complaint  Patient presents with  . HGSIL    New patient    HPI: HISTORY OF PRESENT ILLNESS: Cynthia Ochoa is a 37 year old gravida 1, para 0, with an EDC of 04/11/13 who presents with an abnormal Pap smear during pregnancy. The patient's Pap smear history is as follows: She had her first abnormal Pap smear in 07/2011 that was a low-grade lesion. This was followed up by a normal colposcopy with a negative biopsy in 08/2011. She had a repeat Pap smear in 03/2012 that was low grade. She then underwent a cryotherapy in 04/2012. A followup Pap smear during her first trimester of pregnancy in 09/2012 showed a high-grade lesion. Followup colposcopy was negative per patient report. HPV testing on that pap smear was also negative for high risk HPV. Today, she denies any abnormal bleeding, denies any postcoital bleeding. She has only had 1 sexual partner and these are her first abnormal Pap smears. This is a desired pregnancy.   Interval History:  Colposcopy at [facility] was negative on November 18. The pregnancy is going well she has good fetal movement. She denies any contractions. She's had no bleeding. She also has skin biopsy the right posterior arm that showed atypical intra-abdominal melanotic neoplasm. 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 She is going to have reexcision. 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  Current Meds:  Outpatient Encounter Prescriptions as of 01/11/2013  Medication Sig Dispense Refill  . folic acid (FOLVITE) 800 MCG tablet Take 800 mcg by mouth daily.      . Prenatal Vit-Fe Sulfate-FA (PRENATAL VITAMIN PO) Take by mouth.       No facility-administered encounter medications on file as of 01/11/2013.    Allergy: No Known Allergies  Social Hx:   History   Social History  . Marital Status: Married    Spouse Name: N/A    Number of  Children: N/A  . Years of Education: 16   Occupational History  . nurse    Social History Main Topics  . Smoking status: Never Smoker   . Smokeless tobacco: Not on file  . Alcohol Use: No  . Drug Use: No  . Sexually Active: Not on file   Other Topics Concern  . Not on file   Social History Narrative  . No narrative on file    Past Surgical Hx:  Past Surgical History  Procedure Laterality Date  . Cystectomy      abdomen  . Wisdom tooth extraction      Past Medical Hx: History reviewed. No pertinent past medical history.  Family Hx:  Family History  Problem Relation Age of Onset  . Hypertension Mother   . Hypertension Father   . Melanoma Paternal Uncle     Vitals:  Blood pressure 142/78, pulse 80, temperature 98.1 F (36.7 C), resp. rate 16, height 5\' 7"  (1.702 m), weight 252 lb 12.8 oz (114.669 kg), last menstrual period 07/05/2012.  Physical Exam: Well-nourished well-developed female in no acute distress.  Pelvic: Normal external female genitalia. Vagina is well epithelialized. The cervix is nulliparous. Pap smear was performed without difficulty using the endocervix and ectocervical brush. Colposcopic evaluations after the application acetic acid was performed after obtaining the patient's verbal consent. Colposcopy was adequate with the use of Q-tips completely open the endocervical os. There are no acetowhite epithelial changes noted. There is  no the mosaicism. There is no cobblestone appearance. Bimanual examination the cervix is long and closed. The fetal head is vertex.  Assessment/Plan: 37 year old gravida 1 para 0 who is due on May 20 he had a Pap smear showing high-grade dysplasia with negative high risk HPV. Should negative colposcopy in November as well as a negative exam today. We will followup in results for Pap smear notify her of the results. Should reveal no more than dysplastic process she can deliver as indicated for her obstetrical indications. She'll  need to be seen in 6 weeks postpartum for Pap smear colposcopy and pending those results may need to undergo an excisional procedure at that time.  Cynthia Ochoa A., MD 01/11/2013, 4:04 PM

## 2013-01-11 NOTE — Patient Instructions (Signed)
Follow up with [place] 6 weeks post-partum.

## 2013-01-16 ENCOUNTER — Telehealth: Payer: Self-pay | Admitting: *Deleted

## 2013-01-16 NOTE — Telephone Encounter (Signed)
Patient informed of PAP results  

## 2013-01-23 ENCOUNTER — Other Ambulatory Visit: Payer: Self-pay | Admitting: Dermatology

## 2013-03-17 LAB — OB RESULTS CONSOLE GBS: GBS: NEGATIVE

## 2013-03-24 ENCOUNTER — Encounter (HOSPITAL_COMMUNITY): Payer: Self-pay | Admitting: *Deleted

## 2013-03-24 ENCOUNTER — Inpatient Hospital Stay (HOSPITAL_COMMUNITY)
Admission: AD | Admit: 2013-03-24 | Discharge: 2013-03-25 | Disposition: A | Discharge status: Home / Self Care | Payer: 59 | Source: Ambulatory Visit | Attending: Obstetrics and Gynecology | Admitting: Obstetrics and Gynecology

## 2013-03-24 DIAGNOSIS — M545 Low back pain, unspecified: Secondary | ICD-10-CM | POA: Insufficient documentation

## 2013-03-24 DIAGNOSIS — O4700 False labor before 37 completed weeks of gestation, unspecified trimester: Secondary | ICD-10-CM | POA: Insufficient documentation

## 2013-03-24 NOTE — MAU Note (Signed)
Regular contractions since 1845. Denies leaking or bleeding

## 2013-03-25 MED ORDER — HYDROCODONE-ACETAMINOPHEN 5-325 MG PO TABS
2.0000 | ORAL_TABLET | Freq: Once | ORAL | Status: AC
  Administered 2013-03-25: 2 via ORAL
  Filled 2013-03-25: qty 2

## 2013-03-25 NOTE — Progress Notes (Signed)
Dr  Rana Snare notified of patient, c/o lower back constant pain, intermittent contractions. Patient request for pain medication. Aware of tracing and ctx pattern. Order received for vicodin 2 tablets and discharge home.

## 2013-04-04 ENCOUNTER — Telehealth (HOSPITAL_COMMUNITY): Payer: Self-pay | Admitting: *Deleted

## 2013-04-04 ENCOUNTER — Encounter (HOSPITAL_COMMUNITY): Payer: Self-pay | Admitting: *Deleted

## 2013-04-04 NOTE — Telephone Encounter (Signed)
Preadmission screen  

## 2013-04-07 ENCOUNTER — Encounter (HOSPITAL_COMMUNITY): Payer: Self-pay | Admitting: Anesthesiology

## 2013-04-07 ENCOUNTER — Inpatient Hospital Stay (HOSPITAL_COMMUNITY): Payer: 59 | Admitting: Anesthesiology

## 2013-04-07 ENCOUNTER — Inpatient Hospital Stay (HOSPITAL_COMMUNITY)
Admission: RE | Admit: 2013-04-07 | Discharge: 2013-04-09 | DRG: 775 | Disposition: A | Discharge status: Home / Self Care | Payer: 59 | Source: Ambulatory Visit | Attending: Obstetrics and Gynecology | Admitting: Obstetrics and Gynecology

## 2013-04-07 ENCOUNTER — Encounter (HOSPITAL_COMMUNITY): Payer: Self-pay

## 2013-04-07 DIAGNOSIS — O139 Gestational [pregnancy-induced] hypertension without significant proteinuria, unspecified trimester: Principal | ICD-10-CM | POA: Diagnosis present

## 2013-04-07 DIAGNOSIS — F411 Generalized anxiety disorder: Secondary | ICD-10-CM

## 2013-04-07 DIAGNOSIS — E669 Obesity, unspecified: Secondary | ICD-10-CM

## 2013-04-07 DIAGNOSIS — M75102 Unspecified rotator cuff tear or rupture of left shoulder, not specified as traumatic: Secondary | ICD-10-CM

## 2013-04-07 DIAGNOSIS — H9209 Otalgia, unspecified ear: Secondary | ICD-10-CM

## 2013-04-07 DIAGNOSIS — J209 Acute bronchitis, unspecified: Secondary | ICD-10-CM

## 2013-04-07 DIAGNOSIS — R112 Nausea with vomiting, unspecified: Secondary | ICD-10-CM

## 2013-04-07 DIAGNOSIS — J019 Acute sinusitis, unspecified: Secondary | ICD-10-CM

## 2013-04-07 LAB — RPR: RPR Ser Ql: NONREACTIVE

## 2013-04-07 LAB — TYPE AND SCREEN
ABO/RH(D): A POS
Antibody Screen: NEGATIVE

## 2013-04-07 LAB — CBC
HCT: 37.7 % (ref 36.0–46.0)
Hemoglobin: 13.2 g/dL (ref 12.0–15.0)
RBC: 4.38 MIL/uL (ref 3.87–5.11)
RDW: 13.4 % (ref 11.5–15.5)
WBC: 12.3 10*3/uL — ABNORMAL HIGH (ref 4.0–10.5)

## 2013-04-07 MED ORDER — FLEET ENEMA 7-19 GM/118ML RE ENEM: 1.0000 | ENEMA | Freq: Every day | RECTAL | Status: DC | PRN

## 2013-04-07 MED ORDER — FENTANYL 2.5 MCG/ML BUPIVACAINE 1/10 % EPIDURAL INFUSION (WH - ANES)
INTRAMUSCULAR | Status: DC | PRN
  Administered 2013-04-07: 14 mL/h via EPIDURAL

## 2013-04-07 MED ORDER — LIDOCAINE HCL (PF) 1 % IJ SOLN
30.0000 mL | INTRAMUSCULAR | Status: DC | PRN
  Filled 2013-04-07 (×2): qty 30

## 2013-04-07 MED ORDER — TETANUS-DIPHTH-ACELL PERTUSSIS 5-2.5-18.5 LF-MCG/0.5 IM SUSP: 0.5000 mL | Freq: Once | INTRAMUSCULAR | Status: DC

## 2013-04-07 MED ORDER — OXYCODONE-ACETAMINOPHEN 5-325 MG PO TABS
1.0000 | ORAL_TABLET | ORAL | Status: DC | PRN
  Administered 2013-04-07 – 2013-04-09 (×5): 1 via ORAL
  Filled 2013-04-07 (×5): qty 1

## 2013-04-07 MED ORDER — SIMETHICONE 80 MG PO CHEW: 80.0000 mg | CHEWABLE_TABLET | ORAL | Status: DC | PRN

## 2013-04-07 MED ORDER — BISACODYL 10 MG RE SUPP: 10.0000 mg | Freq: Every day | RECTAL | Status: DC | PRN

## 2013-04-07 MED ORDER — MEDROXYPROGESTERONE ACETATE 150 MG/ML IM SUSP: 150.0000 mg | INTRAMUSCULAR | Status: DC | PRN

## 2013-04-07 MED ORDER — NALBUPHINE SYRINGE 5 MG/0.5 ML
5.0000 mg | INJECTION | Freq: Once | INTRAMUSCULAR | Status: DC
  Filled 2013-04-07: qty 0.5

## 2013-04-07 MED ORDER — EPHEDRINE 5 MG/ML INJ
10.0000 mg | INTRAVENOUS | Status: DC | PRN
  Filled 2013-04-07: qty 2

## 2013-04-07 MED ORDER — OXYCODONE-ACETAMINOPHEN 5-325 MG PO TABS: 1.0000 | ORAL_TABLET | ORAL | Status: DC | PRN

## 2013-04-07 MED ORDER — CITRIC ACID-SODIUM CITRATE 334-500 MG/5ML PO SOLN: 30.0000 mL | ORAL | Status: DC | PRN

## 2013-04-07 MED ORDER — OXYTOCIN 40 UNITS IN LACTATED RINGERS INFUSION - SIMPLE MED: 62.5000 mL/h | INTRAVENOUS | Status: DC

## 2013-04-07 MED ORDER — ONDANSETRON HCL 4 MG PO TABS: 4.0000 mg | ORAL_TABLET | ORAL | Status: DC | PRN

## 2013-04-07 MED ORDER — PHENYLEPHRINE 40 MCG/ML (10ML) SYRINGE FOR IV PUSH (FOR BLOOD PRESSURE SUPPORT)
80.0000 ug | PREFILLED_SYRINGE | INTRAVENOUS | Status: DC | PRN
  Filled 2013-04-07: qty 5
  Filled 2013-04-07: qty 2

## 2013-04-07 MED ORDER — ACETAMINOPHEN 325 MG PO TABS: 650.0000 mg | ORAL_TABLET | ORAL | Status: DC | PRN

## 2013-04-07 MED ORDER — DIBUCAINE 1 % RE OINT: 1.0000 "application " | TOPICAL_OINTMENT | RECTAL | Status: DC | PRN

## 2013-04-07 MED ORDER — IBUPROFEN 600 MG PO TABS
600.0000 mg | ORAL_TABLET | Freq: Four times a day (QID) | ORAL | Status: DC
  Administered 2013-04-08 – 2013-04-09 (×5): 600 mg via ORAL
  Filled 2013-04-07 (×5): qty 1

## 2013-04-07 MED ORDER — EPHEDRINE 5 MG/ML INJ
10.0000 mg | INTRAVENOUS | Status: DC | PRN
  Filled 2013-04-07: qty 4
  Filled 2013-04-07: qty 2

## 2013-04-07 MED ORDER — PRENATAL MULTIVITAMIN CH
1.0000 | ORAL_TABLET | Freq: Every day | ORAL | Status: DC
  Administered 2013-04-08: 1 via ORAL
  Filled 2013-04-07: qty 1

## 2013-04-07 MED ORDER — BENZOCAINE-MENTHOL 20-0.5 % EX AERO
1.0000 "application " | INHALATION_SPRAY | CUTANEOUS | Status: DC | PRN
  Administered 2013-04-08 – 2013-04-09 (×2): 1 via TOPICAL
  Filled 2013-04-07 (×2): qty 56

## 2013-04-07 MED ORDER — FENTANYL 2.5 MCG/ML BUPIVACAINE 1/10 % EPIDURAL INFUSION (WH - ANES)
14.0000 mL/h | INTRAMUSCULAR | Status: DC | PRN
  Filled 2013-04-07: qty 125

## 2013-04-07 MED ORDER — DIPHENHYDRAMINE HCL 50 MG/ML IJ SOLN
12.5000 mg | INTRAMUSCULAR | Status: DC | PRN
  Administered 2013-04-07 (×2): 12.5 mg via INTRAVENOUS
  Filled 2013-04-07 (×2): qty 1

## 2013-04-07 MED ORDER — LANOLIN HYDROUS EX OINT: TOPICAL_OINTMENT | CUTANEOUS | Status: DC | PRN

## 2013-04-07 MED ORDER — DIPHENHYDRAMINE HCL 25 MG PO CAPS: 25.0000 mg | ORAL_CAPSULE | Freq: Four times a day (QID) | ORAL | Status: DC | PRN

## 2013-04-07 MED ORDER — OXYTOCIN 40 UNITS IN LACTATED RINGERS INFUSION - SIMPLE MED
1.0000 m[IU]/min | INTRAVENOUS | Status: DC
  Administered 2013-04-07: 4 m[IU]/min via INTRAVENOUS
  Administered 2013-04-07: 2 m[IU]/min via INTRAVENOUS
  Filled 2013-04-07: qty 1000

## 2013-04-07 MED ORDER — ONDANSETRON HCL 4 MG/2ML IJ SOLN: 4.0000 mg | Freq: Four times a day (QID) | INTRAMUSCULAR | Status: DC | PRN

## 2013-04-07 MED ORDER — MEASLES, MUMPS & RUBELLA VAC ~~LOC~~ INJ
0.5000 mL | INJECTION | Freq: Once | SUBCUTANEOUS | Status: DC
  Filled 2013-04-07: qty 0.5

## 2013-04-07 MED ORDER — ONDANSETRON HCL 4 MG/2ML IJ SOLN: 4.0000 mg | INTRAMUSCULAR | Status: DC | PRN

## 2013-04-07 MED ORDER — SODIUM CHLORIDE 0.9 % IV SOLN
14.0000 mL/h | INTRAVENOUS | Status: DC
  Administered 2013-04-07: 14 mL/h via EPIDURAL
  Filled 2013-04-07 (×2): qty 17

## 2013-04-07 MED ORDER — SENNOSIDES-DOCUSATE SODIUM 8.6-50 MG PO TABS
2.0000 | ORAL_TABLET | Freq: Every day | ORAL | Status: DC
  Administered 2013-04-07 – 2013-04-08 (×2): 2 via ORAL

## 2013-04-07 MED ORDER — ZOLPIDEM TARTRATE 5 MG PO TABS: 5.0000 mg | ORAL_TABLET | Freq: Every evening | ORAL | Status: DC | PRN

## 2013-04-07 MED ORDER — LACTATED RINGERS IV SOLN: INTRAVENOUS | Status: DC

## 2013-04-07 MED ORDER — TERBUTALINE SULFATE 1 MG/ML IJ SOLN: 0.2500 mg | Freq: Once | INTRAMUSCULAR | Status: DC | PRN

## 2013-04-07 MED ORDER — WITCH HAZEL-GLYCERIN EX PADS: 1.0000 "application " | MEDICATED_PAD | CUTANEOUS | Status: DC | PRN

## 2013-04-07 MED ORDER — OXYTOCIN BOLUS FROM INFUSION
500.0000 mL | INTRAVENOUS | Status: DC
  Administered 2013-04-07: 500 mL via INTRAVENOUS

## 2013-04-07 MED ORDER — LACTATED RINGERS IV SOLN
500.0000 mL | INTRAVENOUS | Status: DC | PRN
Start: 2013-04-07 — End: 2013-04-07
  Administered 2013-04-07: 500 mL via INTRAVENOUS

## 2013-04-07 MED ORDER — PHENYLEPHRINE 40 MCG/ML (10ML) SYRINGE FOR IV PUSH (FOR BLOOD PRESSURE SUPPORT)
80.0000 ug | PREFILLED_SYRINGE | INTRAVENOUS | Status: DC | PRN
  Filled 2013-04-07: qty 2

## 2013-04-07 MED ORDER — LIDOCAINE HCL (PF) 1 % IJ SOLN
INTRAMUSCULAR | Status: DC | PRN
  Administered 2013-04-07 (×2): 4 mL

## 2013-04-07 MED ORDER — IBUPROFEN 600 MG PO TABS
600.0000 mg | ORAL_TABLET | Freq: Four times a day (QID) | ORAL | Status: DC | PRN
  Administered 2013-04-07: 600 mg via ORAL
  Filled 2013-04-07: qty 1

## 2013-04-07 MED ORDER — LACTATED RINGERS IV SOLN: 500.0000 mL | Freq: Once | INTRAVENOUS | Status: DC

## 2013-04-07 NOTE — H&P (Signed)
Cynthia Ochoa is a 37 y.o. female presenting for induction of labor secondary to [facility]. BPs at home and office significant and several occasions over 140/90.  Maternal Medical History:  Prenatal complications: PIH.   Prenatal Complications - Diabetes: none.    OB History   Grav Para Term Preterm Abortions TAB SAB Ect Mult Living   1              Past Medical History  Diagnosis Date  . Abnormal Pap smear   . Fibroid   . IBS (irritable bowel syndrome)   . Hx of Salmonella gastroenteritis   . Hx of campylobacteriosis 2005   Past Surgical History  Procedure Laterality Date  . Cystectomy      abdomen  . Wisdom tooth extraction    . Colposcopy    . Gynecologic cryosurgery     Family History: family history includes COPD in her maternal grandfather and paternal grandmother; Hypertension in her father and mother; Melanoma in her paternal uncle; and Prader-Willi syndrome in her brother. Social History:  reports that she has never smoked. She does not have any smokeless tobacco history on file. She reports that she does not drink alcohol or use illicit drugs.   Prenatal Transfer Tool  Maternal Diabetes: No Genetic Screening: Normal Maternal Ultrasounds/Referrals: Normal Fetal Ultrasounds or other Referrals:  None Maternal Substance Abuse:  No Significant Maternal Medications:  None Significant Maternal Lab Results:  None Other Comments:  None  Review of Systems  All other systems reviewed and are negative.    Dilation: 3.5 Effacement (%): 80 Station: -2 Exam by:: dr. Vincente Poli Blood pressure 129/77, pulse 85, temperature 97.9 F (36.6 C), temperature source Oral, resp. rate 18, height 5\' 7"  (1.702 m), weight 122.471 kg (270 lb), last menstrual period 07/05/2012. Exam Physical Exam  Constitutional: She appears well-developed.  HENT:  Head: Normocephalic.  Eyes: Pupils are equal, round, and reactive to light.  Neck: Normal range of motion.  Cardiovascular: Normal rate.    Respiratory: Effort normal.    Prenatal labs: ABO, Rh: A/Positive/-- (10/10 0000) Antibody: Negative (10/10 0000) Rubella: Immune (10/10 0000) RPR: Nonreactive (10/10 0000)  HBsAg: Negative (10/10 0000)  HIV: Non-reactive (10/10 0000)  GBS: Negative (04/25 0000)   Assessment/Plan: IUP at 39 w 3 days  PIH Epidural  Pitocin augmentation Follow BPS Closely   Jasiyah Poland L 04/07/2013, 7:33 AM

## 2013-04-07 NOTE — Anesthesia Preprocedure Evaluation (Signed)
Anesthesia Evaluation  Patient identified by MRN, date of birth, ID band Patient awake    Reviewed: Allergy & Precautions, H&P , Patient's Chart, lab work & pertinent test results  Airway Mallampati: III TM Distance: >3 FB Neck ROM: full    Dental no notable dental hx. (+) Teeth Intact   Pulmonary neg pulmonary ROS,  breath sounds clear to auscultation  Pulmonary exam normal       Cardiovascular negative cardio ROS  Rhythm:regular Rate:Normal     Neuro/Psych PSYCHIATRIC DISORDERS Anxiety  Neuromuscular disease    GI/Hepatic Neg liver ROS, IBS   Endo/Other  Morbid obesity  Renal/GU negative Renal ROS  negative genitourinary   Musculoskeletal   Abdominal Normal abdominal exam  (+)   Peds  Hematology negative hematology ROS (+)   Anesthesia Other Findings   Reproductive/Obstetrics (+) Pregnancy Fibroid uterus                           Anesthesia Physical Anesthesia Plan  ASA: II  Anesthesia Plan: Epidural   Post-op Pain Management:    Induction:   Airway Management Planned:   Additional Equipment:   Intra-op Plan:   Post-operative Plan:   Informed Consent: I have reviewed the patients History and Physical, chart, labs and discussed the procedure including the risks, benefits and alternatives for the proposed anesthesia with the patient or authorized representative who has indicated his/her understanding and acceptance.     Plan Discussed with: Anesthesiologist  Anesthesia Plan Comments:         Anesthesia Quick Evaluation

## 2013-04-07 NOTE — Anesthesia Procedure Notes (Signed)
Epidural Patient location during procedure: OB Start time: 04/07/2013 9:00 AM  Staffing Anesthesiologist: Jozlin Bently A. Performed by: anesthesiologist   Preanesthetic Checklist Completed: patient identified, site marked, surgical consent, pre-op evaluation, timeout performed, IV checked, risks and benefits discussed and monitors and equipment checked  Epidural Patient position: sitting Prep: site prepped and draped and DuraPrep Patient monitoring: continuous pulse ox and blood pressure Approach: midline Injection technique: LOR air  Needle:  Needle type: Tuohy  Needle gauge: 17 G Needle length: 9 cm and 9 Needle insertion depth: 7 cm Catheter type: closed end flexible Catheter size: 19 Gauge Catheter at skin depth: 12 cm Test dose: negative and Other  Assessment Events: blood not aspirated, injection not painful, no injection resistance, negative IV test and no paresthesia  Additional Notes Patient identified. Risks and benefits discussed including failed block, incomplete  Pain control, post dural puncture headache, nerve damage, paralysis, blood pressure Changes, nausea, vomiting, reactions to medications-both toxic and allergic and post Partum back pain. All questions were answered. Patient expressed understanding and wished to proceed. Sterile technique was used throughout procedure. Epidural site was Dressed with sterile barrier dressing. No paresthesias, signs of intravascular injection Or signs of intrathecal spread were encountered.  Patient was more comfortable after the epidural was dosed. Please see RN's note for documentation of vital signs and FHR which are stable.

## 2013-04-08 LAB — CBC
MCV: 87.3 fL (ref 78.0–100.0)
Platelets: 208 10*3/uL (ref 150–400)
RDW: 13.7 % (ref 11.5–15.5)
WBC: 18.6 10*3/uL — ABNORMAL HIGH (ref 4.0–10.5)

## 2013-04-08 NOTE — Anesthesia Postprocedure Evaluation (Signed)
  Anesthesia Post-op Note  Patient: Cynthia Ochoa  Procedure(s) Performed: * No procedures listed *  Patient Location: PACU and Mother/Baby  Anesthesia Type:Epidural  Level of Consciousness: awake, alert  and oriented  Airway and Oxygen Therapy: Patient Spontanous Breathing  Post-op Pain: none  Post-op Assessment: Post-op Vital signs reviewed, Patient's Cardiovascular Status Stable, No headache, No backache, No residual numbness and No residual motor weakness  Post-op Vital Signs: Reviewed and stable  Complications: No apparent anesthesia complications

## 2013-04-08 NOTE — Progress Notes (Signed)
Post Partum Day 1 Subjective: no complaints, up ad lib, voiding, tolerating PO and + flatus  Objective: Blood pressure 121/76, pulse 79, temperature 98.4 F (36.9 C), temperature source Oral, resp. rate 18, height 5\' 7"  (1.702 m), weight 122.471 kg (270 lb), last menstrual period 07/05/2012, SpO2 97.00%, unknown if currently breastfeeding.  Physical Exam:  General: alert, cooperative and appears stated age 37: appropriate Uterine Fundus: firm Incision: healing well, no significant drainage, no dehiscence, no significant erythema DVT Evaluation: No evidence of DVT seen on physical exam.   Recent Labs  04/07/13 0650  HGB 13.2  HCT 37.7    Assessment/Plan: Plan for discharge tomorrow, Breastfeeding and Circumcision prior to discharge   LOS: 1 day   Tiffany Talarico L 04/08/2013, 7:39 AM

## 2013-04-09 MED ORDER — OXYCODONE-ACETAMINOPHEN 5-325 MG PO TABS: 1.0000 | ORAL_TABLET | ORAL | Status: DC | PRN

## 2013-04-09 MED ORDER — IBUPROFEN 600 MG PO TABS: 600.0000 mg | ORAL_TABLET | Freq: Four times a day (QID) | ORAL | Status: DC

## 2013-04-09 NOTE — Discharge Summary (Signed)
Obstetric Discharge Summary Reason for Admission: induction of labor Prenatal Procedures: none Intrapartum Procedures: vacuum Postpartum Procedures: none Complications-Operative and Postpartum: 2nd degree perineal laceration Hemoglobin  Date Value Range Status  04/08/2013 11.3* 12.0 - 15.0 g/dL Final     HCT  Date Value Range Status  04/08/2013 33.0* 36.0 - 46.0 % Final    Physical Exam:  General: alert, cooperative and appears stated age 37: appropriate Uterine Fundus: firm Incision: healing well, no significant drainage, no dehiscence, no significant erythema DVT Evaluation: No evidence of DVT seen on physical exam.  Discharge Diagnoses: Term Pregnancy-delivered  Discharge Information: Date: 04/09/2013 Activity: pelvic rest Diet: routine Medications: Ibuprofen and Percocet Condition: stable Instructions: refer to practice specific booklet Discharge to: home   Newborn Data: Live born female  Birth Weight: 7 lb 4.1 oz (3290 g) APGAR: 8, 9  Home with mother.  Daziya Redmond L 04/09/2013, 8:01 AM

## 2013-04-09 NOTE — Progress Notes (Signed)
Post Partum Day 2 Subjective: no complaints, up ad lib, voiding, tolerating PO and + flatus  Objective: Blood pressure 124/77, pulse 82, temperature 97.9 F (36.6 C), temperature source Oral, resp. rate 18, height 5\' 7"  (1.702 m), weight 122.471 kg (270 lb), last menstrual period 07/05/2012, SpO2 99.00%, unknown if currently breastfeeding.  Physical Exam:  General: alert Lochia: appropriate Uterine Fundus: firm Incision: healing well, no significant drainage, no dehiscence, no significant erythema DVT Evaluation: No evidence of DVT seen on physical exam.   Recent Labs  04/07/13 0650 04/08/13 0646  HGB 13.2 11.3*  HCT 37.7 33.0*    Assessment/Plan: Discharge home and Breastfeeding   LOS: 2 days   Reghan Thul L 04/09/2013, 8:00 AM

## 2013-04-11 ENCOUNTER — Ambulatory Visit (HOSPITAL_COMMUNITY)
Admission: RE | Admit: 2013-04-11 | Discharge: 2013-04-11 | Disposition: A | Discharge status: Home / Self Care | Payer: 59 | Source: Ambulatory Visit | Attending: Obstetrics and Gynecology | Admitting: Obstetrics and Gynecology

## 2013-04-11 NOTE — Progress Notes (Signed)
Post discharge chart review completed.  

## 2013-04-11 NOTE — Lactation Note (Signed)
Adult Lactation Consultation Outpatient Visit Note  Patient Name: Cynthia Ochoa(MOTHER)    BABY:  Cynthia Ochoa Date of Birth: 08-Dec-1975                                        DOB:  [birth year] Gestational Age at Delivery: [redacted]w[redacted]d;P1                     BIRTH WEIGHT: 7-4 Type of Delivery: NVD                                            DISCHARGE WEIGHT: 6-15.1                                                                                 WEIGHT TODAY: 6-4 at pediatricians today Breastfeeding History: Frequency of Breastfeeding: every 1-3 hours  Length of Feeding: 10-40 minutes Voids: 4 Stools: 1  Supplementing / Method:NONE Pumping:  Type of Pump:MEDELA HANDS FREE   Frequency:TWICE  Volume: 15 MLS   Comments:    Consultation Evaluation:Mother and 4 day old son here for feeding assessment.  Mom developed sore, cracked nipples in the hospital and was started using a 20 mm nipple shield.  Mom was able to express colostrum easily during hospital stay.  Parent's just left the pediatrician and baby has lost more than 10 % of weight and bili level over 15.  Baby will begin home phototherapy.  Mom is teary eyed about weight loss and jaundice.  Parents report that Cynthia is needing to be awakened for feeds and sometimes lethargic.  Mom has been using nipple shield but nipples are increasingly more painful.  Discussed with parent's that due to high bili level and over 10 % weight loss pumping and bottle feeding the next few days will establish supply better and ensure baby receives adequate calories.  Pumping plan and feeding volumes given to parents.  Instructed to add formula as needed to obtain milk volume required.  Reassurance given and follow up phone call planned for 04/13/13 and OP appointment 04/19/13.  Initial Feeding Assessment:N/A Pre-feed Weight: Post-feed Weight: Amount Transferred: Comments:  Additional Feeding Assessment: Pre-feed Weight: Post-feed Weight: Amount  Transferred: Comments:  Additional Feeding Assessment: Pre-feed Weight: Post-feed Weight: Amount Transferred: Comments:  Total Breast milk Transferred this Visit:  Total Supplement Given:   Additional Interventions:   Follow-Up PHONE CALL 04/13/13 AND OP APPOINTMENT 04/19/13      Cynthia Ochoa 04/11/2013, 10:04 AM

## 2013-04-14 ENCOUNTER — Ambulatory Visit (HOSPITAL_COMMUNITY): Payer: 59

## 2013-04-19 ENCOUNTER — Ambulatory Visit (HOSPITAL_COMMUNITY)
Admission: RE | Admit: 2013-04-19 | Discharge: 2013-04-19 | Disposition: A | Discharge status: Home / Self Care | Payer: 59 | Source: Ambulatory Visit | Attending: Obstetrics and Gynecology | Admitting: Obstetrics and Gynecology

## 2013-04-19 NOTE — Lactation Note (Signed)
Adult Lactation Consultation Outpatient Visit Note  Patient Name: Cynthia Ochoa(mother)   BABY: Cynthia Ochoa Date of Birth: Jun 14, 1976                                   DOB: [birth year] Gestational Age at Delivery: [redacted]w[redacted]d                 BIRTH WEIGHT: 7-4 Type of Delivery: NVD                                      DISCHARGE WEIGHT: 6-15.1                                                                           WEIGHT AT PEDI. ON 04/11/12: 6-4   WEIGHT TODAY: 7-2.7 Breastfeeding History: Frequency of Breastfeeding:none  Length of Feeding:  Voids: qs Stools: qs  Supplementing / Method:EBM/FORMULA 2-3 OZ EVERY 2-3 HOURS(4 OZ PER DAY EBM) Pumping:  Type of Pump:PUMP IN STYLE   Frequency:EVERY 2 HOURS  Volume:  7 MLS RIGHT/5 MLS LEFT  Comments:    Consultation Evaluation: Mom and 9 day old infant here for evaluation of milk supply.  Baby had initially a greater than 10% weight loss and home phototherapy.  Mom and baby were seen in the office here on day four after the pediatrician check and the plan was to pump and bottle feed until milk supply increases and jaundice resolved.  Baby has been gaining adequately although mother discouraged her milk supply is low.  She has been taking fenugreek and reglan x 4 days but has not seen any improvement.  Assisted with latching Cynthia to breast and he was able to latch well after a few attempts and nursed actively with breast massage.  Mom will continue attempting putting baby to breast more and when not successful pump in place of feeding.  She will continue to supplement feedings with formula.  Encouraged to call office for further follow up or concerns prn.  Initial Feeding Assessment:15 MINUTES RIGHT/5 MINUTES LEFT Pre-feed AVWUJW:1191 Post-feed Weight:3260 Amount Transferred:8 MLS Comments:  Additional Feeding Assessment: Pre-feed Weight: Post-feed Weight: Amount Transferred: Comments:  Additional Feeding Assessment: Pre-feed  Weight: Post-feed Weight: Amount Transferred: Comments:  Total Breast milk Transferred this Visit: 8 MLS Total Supplement Given: 60 MLS FORMULA  Additional Interventions:   Follow-Up  WILL CALL LC OFFICE PRN      Hansel Feinstein 04/19/2013, 9:45 AM

## 2013-05-15 ENCOUNTER — Other Ambulatory Visit: Payer: Self-pay | Admitting: Obstetrics and Gynecology

## 2013-08-16 ENCOUNTER — Other Ambulatory Visit: Payer: Self-pay | Admitting: Obstetrics and Gynecology

## 2013-08-31 ENCOUNTER — Encounter (HOSPITAL_COMMUNITY): Payer: Self-pay | Admitting: Pharmacist

## 2013-09-07 ENCOUNTER — Encounter (HOSPITAL_COMMUNITY): Payer: 59 | Admitting: Anesthesiology

## 2013-09-07 ENCOUNTER — Ambulatory Visit (HOSPITAL_COMMUNITY)
Admission: RE | Admit: 2013-09-07 | Discharge: 2013-09-07 | Disposition: A | Discharge status: Home / Self Care | Payer: 59 | Source: Ambulatory Visit | Attending: Obstetrics and Gynecology | Admitting: Obstetrics and Gynecology

## 2013-09-07 ENCOUNTER — Encounter (HOSPITAL_COMMUNITY)
Admission: RE | Disposition: A | Discharge status: Home / Self Care | Payer: Self-pay | Source: Ambulatory Visit | Attending: Obstetrics and Gynecology

## 2013-09-07 ENCOUNTER — Encounter (HOSPITAL_COMMUNITY): Payer: Self-pay | Admitting: *Deleted

## 2013-09-07 ENCOUNTER — Ambulatory Visit (HOSPITAL_COMMUNITY): Payer: 59 | Admitting: Anesthesiology

## 2013-09-07 DIAGNOSIS — H9209 Otalgia, unspecified ear: Secondary | ICD-10-CM

## 2013-09-07 DIAGNOSIS — M75102 Unspecified rotator cuff tear or rupture of left shoulder, not specified as traumatic: Secondary | ICD-10-CM

## 2013-09-07 DIAGNOSIS — J209 Acute bronchitis, unspecified: Secondary | ICD-10-CM

## 2013-09-07 DIAGNOSIS — N870 Mild cervical dysplasia: Secondary | ICD-10-CM | POA: Insufficient documentation

## 2013-09-07 DIAGNOSIS — Z30433 Encounter for removal and reinsertion of intrauterine contraceptive device: Secondary | ICD-10-CM | POA: Insufficient documentation

## 2013-09-07 DIAGNOSIS — R112 Nausea with vomiting, unspecified: Secondary | ICD-10-CM

## 2013-09-07 DIAGNOSIS — E669 Obesity, unspecified: Secondary | ICD-10-CM

## 2013-09-07 DIAGNOSIS — J019 Acute sinusitis, unspecified: Secondary | ICD-10-CM

## 2013-09-07 DIAGNOSIS — F411 Generalized anxiety disorder: Secondary | ICD-10-CM

## 2013-09-07 HISTORY — PX: CERVICAL CONIZATION W/BX: SHX1330

## 2013-09-07 LAB — CBC
Hemoglobin: 15.1 g/dL — ABNORMAL HIGH (ref 12.0–15.0)
MCHC: 35 g/dL (ref 30.0–36.0)
MCV: 82.9 fL (ref 78.0–100.0)

## 2013-09-07 LAB — PREGNANCY, URINE: Preg Test, Ur: NEGATIVE

## 2013-09-07 SURGERY — CONE BIOPSY, CERVIX
Anesthesia: General

## 2013-09-07 MED ORDER — FENTANYL CITRATE 0.05 MG/ML IJ SOLN
INTRAMUSCULAR | Status: AC
  Filled 2013-09-07: qty 2

## 2013-09-07 MED ORDER — METOCLOPRAMIDE HCL 5 MG/ML IJ SOLN: 10.0000 mg | Freq: Once | INTRAMUSCULAR | Status: DC | PRN

## 2013-09-07 MED ORDER — FENTANYL CITRATE 0.05 MG/ML IJ SOLN
INTRAMUSCULAR | Status: AC
  Administered 2013-09-07: 50 ug via INTRAVENOUS
  Filled 2013-09-07: qty 2

## 2013-09-07 MED ORDER — LACTATED RINGERS IV SOLN
INTRAVENOUS | Status: DC
  Administered 2013-09-07: 15:00:00 via INTRAVENOUS

## 2013-09-07 MED ORDER — LIDOCAINE HCL (CARDIAC) 20 MG/ML IV SOLN
INTRAVENOUS | Status: DC | PRN
  Administered 2013-09-07: 70 mg via INTRAVENOUS
  Administered 2013-09-07: 30 mg via INTRAVENOUS

## 2013-09-07 MED ORDER — KETOROLAC TROMETHAMINE 30 MG/ML IJ SOLN
INTRAMUSCULAR | Status: AC
  Filled 2013-09-07: qty 1

## 2013-09-07 MED ORDER — ACETIC ACID 4% SOLUTION
Status: DC | PRN
  Administered 2013-09-07: 1 via TOPICAL

## 2013-09-07 MED ORDER — FERRIC SUBSULFATE 259 MG/GM EX SOLN
CUTANEOUS | Status: AC
  Filled 2013-09-07: qty 8

## 2013-09-07 MED ORDER — ONDANSETRON HCL 4 MG/2ML IJ SOLN
INTRAMUSCULAR | Status: DC | PRN
  Administered 2013-09-07: 4 mg via INTRAMUSCULAR

## 2013-09-07 MED ORDER — PROPOFOL 10 MG/ML IV BOLUS
INTRAVENOUS | Status: DC | PRN
  Administered 2013-09-07: 170 mg via INTRAVENOUS

## 2013-09-07 MED ORDER — ONDANSETRON HCL 4 MG/2ML IJ SOLN
INTRAMUSCULAR | Status: AC
  Filled 2013-09-07: qty 2

## 2013-09-07 MED ORDER — CEFAZOLIN SODIUM-DEXTROSE 2-3 GM-% IV SOLR
INTRAVENOUS | Status: AC
  Filled 2013-09-07: qty 50

## 2013-09-07 MED ORDER — MIDAZOLAM HCL 2 MG/2ML IJ SOLN
INTRAMUSCULAR | Status: DC | PRN
  Administered 2013-09-07: 2 mg via INTRAVENOUS

## 2013-09-07 MED ORDER — FLUMAZENIL 0.5 MG/5ML IV SOLN
INTRAVENOUS | Status: DC | PRN
  Administered 2013-09-07: .15 mg via INTRAVENOUS

## 2013-09-07 MED ORDER — ACETIC ACID 5 % SOLN
Status: AC
  Filled 2013-09-07: qty 500

## 2013-09-07 MED ORDER — FENTANYL CITRATE 0.05 MG/ML IJ SOLN
INTRAMUSCULAR | Status: DC | PRN
  Administered 2013-09-07 (×4): 50 ug via INTRAVENOUS

## 2013-09-07 MED ORDER — FLUMAZENIL 1 MG/10ML IV SOLN
INTRAVENOUS | Status: AC
  Filled 2013-09-07: qty 10

## 2013-09-07 MED ORDER — CEFAZOLIN SODIUM-DEXTROSE 2-3 GM-% IV SOLR
2.0000 g | INTRAVENOUS | Status: AC
  Administered 2013-09-07: 2 g via INTRAVENOUS

## 2013-09-07 MED ORDER — LIDOCAINE-EPINEPHRINE 1 %-1:100000 IJ SOLN
INTRAMUSCULAR | Status: AC
  Filled 2013-09-07: qty 1

## 2013-09-07 MED ORDER — FERRIC SUBSULFATE SOLN
Status: DC | PRN
  Administered 2013-09-07: 1

## 2013-09-07 MED ORDER — MEPERIDINE HCL 25 MG/ML IJ SOLN: 6.2500 mg | INTRAMUSCULAR | Status: DC | PRN

## 2013-09-07 MED ORDER — KETOROLAC TROMETHAMINE 30 MG/ML IJ SOLN
INTRAMUSCULAR | Status: DC | PRN
  Administered 2013-09-07: 30 mg via INTRAVENOUS

## 2013-09-07 MED ORDER — LACTATED RINGERS IV SOLN
INTRAVENOUS | Status: DC
  Administered 2013-09-07: 13:00:00 via INTRAVENOUS

## 2013-09-07 MED ORDER — PROPOFOL 10 MG/ML IV EMUL
INTRAVENOUS | Status: AC
  Filled 2013-09-07: qty 20

## 2013-09-07 MED ORDER — IODINE STRONG (LUGOLS) 5 % PO SOLN
ORAL | Status: AC
  Filled 2013-09-07: qty 1

## 2013-09-07 MED ORDER — MIDAZOLAM HCL 2 MG/2ML IJ SOLN
INTRAMUSCULAR | Status: AC
  Filled 2013-09-07: qty 2

## 2013-09-07 MED ORDER — LIDOCAINE-EPINEPHRINE 1 %-1:100000 IJ SOLN
INTRAMUSCULAR | Status: DC | PRN
  Administered 2013-09-07: 10 mL

## 2013-09-07 MED ORDER — FENTANYL CITRATE 0.05 MG/ML IJ SOLN
25.0000 ug | INTRAMUSCULAR | Status: DC | PRN
  Administered 2013-09-07 (×4): 50 ug via INTRAVENOUS

## 2013-09-07 MED ORDER — KETOROLAC TROMETHAMINE 30 MG/ML IJ SOLN: 15.0000 mg | Freq: Once | INTRAMUSCULAR | Status: DC | PRN

## 2013-09-07 MED ORDER — LIDOCAINE HCL (CARDIAC) 20 MG/ML IV SOLN
INTRAVENOUS | Status: AC
  Filled 2013-09-07: qty 5

## 2013-09-07 MED ORDER — IODINE STRONG (LUGOLS) 5 % PO SOLN
ORAL | Status: DC | PRN
  Administered 2013-09-07: 0.1 mL via ORAL

## 2013-09-07 SURGICAL SUPPLY — 31 items
APPLICATOR COTTON TIP 6IN STRL (MISCELLANEOUS) IMPLANT
BLADE SURG 11 STRL SS (BLADE) ×2 IMPLANT
CANISTER SUCT 3000ML (MISCELLANEOUS) ×2 IMPLANT
CATH ROBINSON RED A/P 16FR (CATHETERS) ×1 IMPLANT
CLOTH BEACON ORANGE TIMEOUT ST (SAFETY) ×2 IMPLANT
CONTAINER PREFILL 10% NBF 60ML (FORM) ×2 IMPLANT
COUNTER NEEDLE 1200 MAGNETIC (NEEDLE) IMPLANT
DRESSING TELFA 8X3 (GAUZE/BANDAGES/DRESSINGS) ×2 IMPLANT
ELECT LLETZ BALL 5MM DISP (ELECTRODE) ×1 IMPLANT
ELECT REM PT RETURN 9FT ADLT (ELECTROSURGICAL) ×2
ELECTRODE REM PT RTRN 9FT ADLT (ELECTROSURGICAL) ×1 IMPLANT
GAUZE SPONGE 4X4 16PLY XRAY LF (GAUZE/BANDAGES/DRESSINGS) IMPLANT
GLOVE BIO SURGEON STRL SZ 6.5 (GLOVE) ×2 IMPLANT
GOWN STRL REIN XL XLG (GOWN DISPOSABLE) ×4 IMPLANT
NDL SPNL 22GX3.5 QUINCKE BK (NEEDLE) ×1 IMPLANT
NEEDLE SPNL 22GX3.5 QUINCKE BK (NEEDLE) ×2 IMPLANT
NS IRRIG 1000ML POUR BTL (IV SOLUTION) ×2 IMPLANT
PACK VAGINAL MINOR WOMEN LF (CUSTOM PROCEDURE TRAY) ×2 IMPLANT
PAD OB MATERNITY 4.3X12.25 (PERSONAL CARE ITEMS) ×2 IMPLANT
PENCIL BUTTON HOLSTER BLD 10FT (ELECTRODE) IMPLANT
SCOPETTES 8  STERILE (MISCELLANEOUS) ×2
SCOPETTES 8 STERILE (MISCELLANEOUS) ×2 IMPLANT
SPONGE SURGIFOAM ABS GEL 12-7 (HEMOSTASIS) ×1 IMPLANT
SUT VIC AB 0 CT1 27 (SUTURE) ×4
SUT VIC AB 0 CT1 27XBRD ANBCTR (SUTURE) ×3 IMPLANT
SUT VIC AB 2-0 CT1 (SUTURE) IMPLANT
SYR CONTROL 10ML LL (SYRINGE) ×2 IMPLANT
TOWEL OR 17X24 6PK STRL BLUE (TOWEL DISPOSABLE) ×4 IMPLANT
TUBING NON-CON 1/4 X 20 CONN (TUBING) IMPLANT
WATER STERILE IRR 1000ML POUR (IV SOLUTION) ×2 IMPLANT
YANKAUER SUCT BULB TIP NO VENT (SUCTIONS) IMPLANT

## 2013-09-07 NOTE — Progress Notes (Signed)
H and P on the chart No significant changes Consent signed 

## 2013-09-07 NOTE — Anesthesia Postprocedure Evaluation (Signed)
  Anesthesia Post-op Note  Patient: Cynthia Ochoa  Procedure(s) Performed: Procedure(s): CONIZATION CERVIX WITH BIOPSY/REMOVAL OF IUD AND INSERTION (N/A) Patient is awake and responsive. Pain and nausea are reasonably well controlled. Vital signs are stable and clinically acceptable. Oxygen saturation is clinically acceptable. There are no apparent anesthetic complications at this time. Patient is ready for discharge.

## 2013-09-07 NOTE — Anesthesia Preprocedure Evaluation (Signed)
Anesthesia Evaluation  Patient identified by MRN, date of birth, ID band Patient awake    Reviewed: Allergy & Precautions, H&P , NPO status , Patient's Chart, lab work & pertinent test results, reviewed documented beta blocker date and time   History of Anesthesia Complications Negative for: history of anesthetic complications  Airway Mallampati: II TM Distance: >3 FB Neck ROM: full    Dental  (+) Teeth Intact   Pulmonary neg pulmonary ROS,  breath sounds clear to auscultation  Pulmonary exam normal       Cardiovascular Exercise Tolerance: Good negative cardio ROS  Rhythm:regular Rate:Normal     Neuro/Psych negative neurological ROS  negative psych ROS   GI/Hepatic negative GI ROS, Neg liver ROS,   Endo/Other  Obese BMI 38.7  Renal/GU negative Renal ROS  Female GU complaint     Musculoskeletal   Abdominal   Peds  Hematology negative hematology ROS (+)   Anesthesia Other Findings   Reproductive/Obstetrics negative OB ROS                           Anesthesia Physical Anesthesia Plan  ASA: II  Anesthesia Plan: General LMA   Post-op Pain Management:    Induction:   Airway Management Planned:   Additional Equipment:   Intra-op Plan:   Post-operative Plan:   Informed Consent: I have reviewed the patients History and Physical, chart, labs and discussed the procedure including the risks, benefits and alternatives for the proposed anesthesia with the patient or authorized representative who has indicated his/her understanding and acceptance.   Dental Advisory Given  Plan Discussed with: CRNA and Surgeon  Anesthesia Plan Comments:         Anesthesia Quick Evaluation

## 2013-09-07 NOTE — Transfer of Care (Signed)
Immediate Anesthesia Transfer of Care Note  Patient: Cynthia Ochoa  Procedure(s) Performed: Procedure(s): CONIZATION CERVIX WITH BIOPSY/REMOVAL OF IUD AND INSERTION (N/A)  Patient Location: PACU  Anesthesia Type:General  Level of Consciousness: awake, oriented and patient cooperative  Airway & Oxygen Therapy: Patient Spontanous Breathing and Patient connected to nasal cannula oxygen  Post-op Assessment: Report given to PACU RN and Post -op Vital signs reviewed and stable  Post vital signs: Reviewed and stable  Complications: No apparent anesthesia complications

## 2013-09-07 NOTE — H&P (Signed)
Cynthia Ochoa is an 37 y.o. female presenting for Cold Knife Conization of Cervix.  She has the Mirena IUD  Pertinent Gynecological History: Menses: flow is light Bleeding: none Contraception: IUD DES exposure: denies Blood transfusions: none Sexually transmitted diseases: no past history Previous GYN Procedures: cryo  Last mammogram: not applicable Date: none Last pap: normal Date: 07/2013 OB History: G1, 1   Menstrual History: Menarche age: unknown No LMP recorded. LMP 08/30/2013    Past Medical History  Diagnosis Date  . Abnormal Pap smear   . Fibroid   . IBS (irritable bowel syndrome)   . Hx of Salmonella gastroenteritis   . Hx of campylobacteriosis 2005    Past Surgical History  Procedure Laterality Date  . Cystectomy      abdomen  . Wisdom tooth extraction    . Colposcopy    . Gynecologic cryosurgery      Family History  Problem Relation Age of Onset  . Hypertension Mother   . Hypertension Father   . Melanoma Paternal Uncle   . Prader-Willi syndrome Brother   . COPD Maternal Grandfather   . COPD Paternal Grandmother     Social History:  reports that she has never smoked. She does not have any smokeless tobacco history on file. She reports that she does not drink alcohol or use illicit drugs.  Allergies:  Allergies  Allergen Reactions  . Adhesive [Tape]     rash    No prescriptions prior to admission    Review of Systems  All other systems reviewed and are negative.    Height 5' 7.5" (1.715 m), weight 113.399 kg (250 lb), unknown if currently breastfeeding. Physical Exam  Nursing note and vitals reviewed. Constitutional: She appears well-developed.  HENT:  Head: Normocephalic.  Cardiovascular: Normal rate and normal heart sounds.   Respiratory: Effort normal.  GI: Soft.  Genitourinary: Vagina normal and uterus normal.  Musculoskeletal: Normal range of motion.    No results found for this or any previous visit (from the past 24  hour(s)).  No results found.  Assessment/Plan: Cervical dysplasia Cold Knife Conization Remove IUD and Reinsert after [facility] Risks reviewed  Consent signed  Tashina Credit L 09/07/2013, 11:35 AM

## 2013-09-07 NOTE — Brief Op Note (Signed)
09/07/2013  2:03 PM  PATIENT:  Cynthia Ochoa  37 y.o. female  PRE-OPERATIVE DIAGNOSIS:  CIN I  POST-OPERATIVE DIAGNOSIS:  CIN I  PROCEDURE:  Procedure(s): CONIZATION CERVIX WITH BIOPSY/REMOVAL OF IUD AND INSERTION (N/A)  SURGEON:  Surgeon(s) and Role:    * Jeani Hawking, MD - Primary  PHYSICIAN ASSISTANT:   ASSISTANTS: none   ANESTHESIA:   paracervical block and MAC  EBL:  Total I/O In: -  Out: 150 [Urine:150]  BLOOD ADMINISTERED:none  DRAINS: none   LOCAL MEDICATIONS USED:  MARCAINE     SPECIMEN:  Source of Specimen:  cone bed  DISPOSITION OF SPECIMEN:  PATHOLOGY  COUNTS:  YES  TOURNIQUET:  * No tourniquets in log *  DICTATION: .Other Dictation: Dictation Number [id number]  PLAN OF CARE: Discharge to home after PACU  PATIENT DISPOSITION:  PACU - hemodynamically stable.   Delay start of Pharmacological VTE agent (>24hrs) due to surgical blood loss or risk of bleeding: not applicable

## 2013-09-08 ENCOUNTER — Encounter (HOSPITAL_COMMUNITY): Payer: Self-pay | Admitting: Obstetrics and Gynecology

## 2013-09-08 NOTE — Op Note (Signed)
NAMESHAMYA, MACFADDEN             ACCOUNT NO.:  [id number]  MEDICAL RECORD NO.:  [id number]  LOCATION:  [facility]                          FACILITY:  [facility]  PHYSICIAN:  Ragnar Waas L. 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  PROCEDURE:  Cold-knife conization of cervix, removal of IUD and re- insertion of same IUD.  SURGEON:  Journe Hallmark L. Vincente Poli, M.D.  ANESTHESIA:  Paracervical with MAC.  EBL:  50 mL.  COMPLICATIONS:  None.  PATHOLOGY:  Cone bed, sent to Pathology.  DESCRIPTION OF PROCEDURE:  The patient was taken to the operating room. Her anesthesia was administered.  She was then prepped and draped in usual sterile fashion.  In-and-out catheter was used to empty the bladder.  Paracervical block was performed.  The IUD was then removed and set aside.  At 3 and 9 o'clock on the cervix, I then placed a stitch and tied that down for the count.  Then using the scalpel, a cold-knife cone of the cervix was performed with good length and good depth. Rollerball was used for hemostasis.  The cervical internal os was identified, dilated slightly and the IUD was reinserted without any difficulty.  I could tell that it had reached the fundus and appropriate length of strings was noted from the IUD extruding from the external os. Monsel's was applied.  Hemostasis was very good.  Two small pieces of Gelfoam were placed in the cone bed.  This was tied down with the suture.  There was no bleeding noted.  All sponge, lap, and instrument counts were correct x2.  The patient went to recovery room in  stable condition.     Marik Sedore L. Vincente Poli, M.D.    Florestine Avers  D:  09/07/2013  T:  09/08/2013  Job:  [id number]

## 2013-09-28 ENCOUNTER — Other Ambulatory Visit: Payer: Self-pay

## 2013-10-31 ENCOUNTER — Encounter: Payer: Self-pay | Admitting: Gastroenterology

## 2013-11-06 ENCOUNTER — Encounter: Payer: 59 | Admitting: Gastroenterology

## 2013-11-27 MED ORDER — AZITHROMYCIN 250 MG PO TABS: ORAL_TABLET | ORAL | Status: DC

## 2013-11-27 NOTE — Telephone Encounter (Signed)
PT C/O PERSISTENT GREEN NASAL DRAINAGE, AND TEETH AND FACE PAIN.SX FOR 2 WEEKS. RX FOR Z PAK SENT.

## 2013-12-20 ENCOUNTER — Other Ambulatory Visit: Payer: Self-pay | Admitting: Obstetrics and Gynecology

## 2014-04-16 ENCOUNTER — Ambulatory Visit (INDEPENDENT_AMBULATORY_CARE_PROVIDER_SITE_OTHER): Payer: 59 | Admitting: Physician Assistant

## 2014-04-16 VITALS — BP 132/84 | HR 91 | Temp 97.5°F | Resp 18 | Ht 67.0 in | Wt 261.0 lb

## 2014-04-16 DIAGNOSIS — R062 Wheezing: Secondary | ICD-10-CM

## 2014-04-16 DIAGNOSIS — R059 Cough, unspecified: Secondary | ICD-10-CM

## 2014-04-16 DIAGNOSIS — R05 Cough: Secondary | ICD-10-CM

## 2014-04-16 DIAGNOSIS — I319 Disease of pericardium, unspecified: Secondary | ICD-10-CM

## 2014-04-16 DIAGNOSIS — J329 Chronic sinusitis, unspecified: Secondary | ICD-10-CM

## 2014-04-16 DIAGNOSIS — H6690 Otitis media, unspecified, unspecified ear: Secondary | ICD-10-CM

## 2014-04-16 DIAGNOSIS — H6692 Otitis media, unspecified, left ear: Secondary | ICD-10-CM

## 2014-04-16 MED ORDER — METHYLPREDNISOLONE ACETATE 80 MG/ML IJ SUSP
80.0000 mg | Freq: Once | INTRAMUSCULAR | Status: AC
  Administered 2014-04-16: 80 mg via INTRAMUSCULAR

## 2014-04-16 MED ORDER — AMOXICILLIN 875 MG PO TABS
875.0000 mg | ORAL_TABLET | Freq: Two times a day (BID) | ORAL | Status: DC
Start: 2014-04-16 — End: 2015-10-15

## 2014-04-16 MED ORDER — ALBUTEROL SULFATE (2.5 MG/3ML) 0.083% IN NEBU
2.5000 mg | INHALATION_SOLUTION | Freq: Once | RESPIRATORY_TRACT | Status: AC
  Administered 2014-04-16: 2.5 mg via RESPIRATORY_TRACT

## 2014-04-16 MED ORDER — HYDROCODONE-HOMATROPINE 5-1.5 MG/5ML PO SYRP: 5.0000 mL | ORAL_SOLUTION | Freq: Three times a day (TID) | ORAL | Status: DC | PRN

## 2014-04-16 NOTE — Patient Instructions (Signed)
Restart the Flonase and mucinex and take them for the same time as the abx.

## 2014-04-16 NOTE — Progress Notes (Signed)
   Subjective:    Patient ID: Cynthia Ochoa, female    DOB: November 01, 1976, 38 y.o.   MRN: [id number]  HPI Pt presents to clinic with left ear pain that started about 3am that woke her from her sleep.  She has had congestion with green rhinorrhea and PND with a cough with green sputum for about 10 days.  5 days ago she was put on a Zpack but she has gotten worse while on the medication. She is having some slight SOB and wheezing she noticed this am.  She has no h/o asthma.  She did move a family member yesterday and cut the grass but she has no h/o seasonal allergies.  She has taken a few doses of Mucinex and Flonase but did not feel like they are helping so she stopped them.  OTC meds - mucinex and flonase  Review of Systems  HENT: Positive for congestion, ear pain (left side), hearing loss, postnasal drip and rhinorrhea (green). Negative for ear discharge.   Respiratory: Positive for cough (green).   Allergic/Immunologic: Negative for environmental allergies.  Psychiatric/Behavioral: Positive for sleep disturbance (2nd to cough).       Objective:   Physical Exam  Vitals reviewed. Constitutional: She is oriented to person, place, and time. She appears well-developed and well-nourished.  HENT:  Head: Normocephalic and atraumatic.  Right Ear: Hearing, tympanic membrane, external ear and ear canal normal.  Left Ear: Hearing, external ear and ear canal normal. Tympanic membrane is erythematous and bulging.  Nose: Mucosal edema (turbinates touching the septum,R>L, pale in color) present.  Mouth/Throat: Uvula is midline, oropharynx is clear and moist and mucous membranes are normal.  Eyes: Conjunctivae are normal.  Neck: Normal range of motion.  Cardiovascular: Normal rate, regular rhythm and normal heart sounds.   Pulmonary/Chest: Effort normal. She has wheezes (expiratory).  Lymphadenopathy:    She has no cervical adenopathy.  Neurological: She is alert and oriented to person, place, and  time.  Skin: Skin is warm and dry.  Psychiatric: She has a normal mood and affect. Her behavior is normal. Judgment and thought content normal.      Assessment & Plan:  Wheezing - Plan: albuterol (PROVENTIL) (2.5 MG/3ML) 0.083% nebulizer solution 2.5 mg, methylPREDNISolone acetate (DEPO-MEDROL) injection 80 mg  Sinus infection - Plan: amoxicillin (AMOXIL) 875 MG tablet  Otitis media of left ear - Plan: amoxicillin (AMOXIL) 875 MG tablet  Cough - Plan: HYDROcodone-homatropine (HYCODAN) 5-1.5 MG/5ML syrup  Pt should continue her mucinex and Flonase because that will help decrease her ear pain the fastest.  She should stop the Zpack and we will start her on Amoxil to cover a sinus infection and AOM.  Sh can add motrin for the pain.  Windell Hummingbird PA-C  [facility] 04/16/2014 11:07 AM

## 2014-05-16 ENCOUNTER — Other Ambulatory Visit: Payer: Self-pay | Admitting: Obstetrics and Gynecology

## 2014-05-17 LAB — CYTOLOGY - PAP

## 2014-09-07 ENCOUNTER — Other Ambulatory Visit: Payer: Self-pay

## 2014-11-05 ENCOUNTER — Telehealth: Payer: Self-pay | Admitting: Gastroenterology

## 2014-11-05 NOTE — Telephone Encounter (Signed)
PATIENT SCHEDULED AND AWARE OF APPOINTMENT

## 2014-11-05 NOTE — Telephone Encounter (Signed)
SCHEDULE PT FOR [facility] BANDING THUR DEC 17 AT 12N. E30. PLACE HER IN TEH 1130 SLOT PT WILL BE IN OFC BY 12N.

## 2014-11-08 ENCOUNTER — Ambulatory Visit (INDEPENDENT_AMBULATORY_CARE_PROVIDER_SITE_OTHER): Payer: 59 | Admitting: Gastroenterology

## 2014-11-08 ENCOUNTER — Encounter: Payer: Self-pay | Admitting: Gastroenterology

## 2014-11-08 VITALS — BP 133/91 | HR 75 | Temp 98.1°F | Ht 67.0 in | Wt 242.6 lb

## 2014-11-08 DIAGNOSIS — K642 Third degree hemorrhoids: Secondary | ICD-10-CM | POA: Insufficient documentation

## 2014-11-08 DIAGNOSIS — K648 Other hemorrhoids: Secondary | ICD-10-CM

## 2014-11-08 NOTE — Progress Notes (Signed)
SYMPTOMS: RECTAL PAIN, ITCHING, BURNING, OR SOILING.  CONSTIPATION: YES DIARRHEA: YES  STRAINS WITH BMs: YES  TIME SPENT ON TOILET: 5 MINS TISSUE POKES OUT OF RECTUM: YES- PUSHES BACK IN FIBER SUPPLEMENTS: NO  GLASSES OF WATER/DAY: 6-8-NO  ADDITIONAL QUESTIONS:  LATEX ALLERGY: NO PREGNANT: NO ERECTILE DYSFUNCTION MEDS OR NITRATES: NO ANTICOAGULATION/ANTIPLATELET MEDS: NO DIAGNOSED WITH CROHN'S DISEASE, PROCTITIS, PORTAL HTN, OR ANAL/RECTAL CA: NO TAKING IMMUNOSUPPRESSANTS/XRT: NO   PLAN: 1. [facility] BANDING TODAY   PROCEDURE TECHNIQUE: BENEFITS RISK EXPLAINED TO PT. ANOSCOPY PERFORMED. BULGING INTERNAL HEMORRHOID COLUMN IN THE R POSTERIOR, LEFT LATERAL, AND R ANTERIOR COLUMNS. ONE [facility] BAND PLACED IN RIGHT POSTERIOR  AND ANTERIOR POSITION. POST-BANDING RECTAL EXAM REVEALED GOOD PLACEMENT. EXAM NON-TENDER.

## 2014-11-08 NOTE — Patient Instructions (Addendum)
FOLLOW A HIGH FIBER DIET OR USE FIBER GUMMIES/TABLETS.  DRINK WATER TO KEEP YOUR URINE LIGHT YELLOW.  USE COLACE MON/WED/FRI.Marland Kitchen  FOLLOW UP IN JAN 6 AT 330P for repeat banding. PLAN FOR 6 BANDS TO BE PLACED

## 2014-11-08 NOTE — Progress Notes (Signed)
   Subjective:    Patient ID: Cynthia Ochoa, female    DOB: Feb 15, 1976, 38 y.o.   MRN: [id number] Delphina Cahill, MD  HPI C/OINTERMITTENT  CONSTIPATION & DIFFICULTY PUSHING STOOL OUT. MAY HAVE INTERMITTENT DIARRHEA. HAD A THROMBOSED HEMORRHOID BUT IT GOT BETTER WITH SITZ BATHES. SAW JENKINS BUT DECLINED TO LANCE HEMORRHOID. RECOMMENDED SURGERY. SHE WANTS LESS INVASIVE MANAGEMENT OF HEMORRHOIDS.  Past Medical History  Diagnosis Date  . Abnormal Pap smear   . Fibroid   . IBS (irritable bowel syndrome)   . Hx of Salmonella gastroenteritis   . Hx of campylobacteriosis 2005   Past Surgical History  Procedure Laterality Date  . Cystectomy      abdomen  . Wisdom tooth extraction    . Colposcopy    . Gynecologic cryosurgery    . Vaginal delivery  04/07/13  . Cervical conization w/bx N/A 09/07/2013    Procedure: CONIZATION CERVIX WITH BIOPSY/REMOVAL OF IUD AND INSERTION;  Surgeon: Cyril Mourning, MD;  Location: [facility];  Service: Gynecology;  Laterality: N/A;   Allergies  Allergen Reactions  . Adhesive [Tape]     rash   Current Outpatient Prescriptions  Medication Sig Dispense Refill  . buPROPion (WELLBUTRIN SR) 150 MG 12 hr tablet Take 150 mg by mouth daily.    . Diethylpropion HCl (TENUATE PO) Take 75 mg by mouth daily.    .      .         Review of Systems     Objective:   Physical Exam  Constitutional: She is oriented to person, place, and time. She appears well-developed and well-nourished. No distress.  HENT:  Head: Normocephalic and atraumatic.  Mouth/Throat: No oropharyngeal exudate.  Eyes: Pupils are equal, round, and reactive to light. No scleral icterus.  Cardiovascular: Normal rate, regular rhythm and normal heart sounds.   Pulmonary/Chest: Effort normal and breath sounds normal. No respiratory distress.  Abdominal: Soft. Bowel sounds are normal. She exhibits no distension. There is no tenderness.  Musculoskeletal: She exhibits no edema.  Lymphadenopathy:   She has no cervical adenopathy.  Neurological: She is alert and oriented to person, place, and time.  Psychiatric: She has a normal mood and affect.  Vitals reviewed.         Assessment & Plan:

## 2014-11-08 NOTE — Assessment & Plan Note (Signed)
S/p thrombosis, now pain free. MODERATE INTERNAL HEMORRHOIDS ON ANOSCOPY.  Eat fiber Drink water COLACE daily FOLLOW UP IN 1-2 weeks for repeat banding. PLAN FOR 6 BANDS TO PLACED

## 2014-11-12 ENCOUNTER — Other Ambulatory Visit: Payer: Self-pay | Admitting: Obstetrics and Gynecology

## 2014-11-13 LAB — CYTOLOGY - PAP

## 2014-11-20 ENCOUNTER — Encounter: Payer: Self-pay | Admitting: Gastroenterology

## 2014-11-22 NOTE — Progress Notes (Signed)
cc'ed to pcp °

## 2014-11-28 ENCOUNTER — Ambulatory Visit (INDEPENDENT_AMBULATORY_CARE_PROVIDER_SITE_OTHER): Payer: 59 | Admitting: Gastroenterology

## 2014-11-28 ENCOUNTER — Encounter: Payer: Self-pay | Admitting: Gastroenterology

## 2014-11-28 VITALS — BP 145/94 | HR 94 | Temp 98.1°F | Ht 67.0 in | Wt 242.6 lb

## 2014-11-28 DIAGNOSIS — K648 Other hemorrhoids: Secondary | ICD-10-CM

## 2014-11-28 DIAGNOSIS — K642 Third degree hemorrhoids: Secondary | ICD-10-CM

## 2014-11-28 NOTE — Progress Notes (Signed)
Patient ID: Cynthia Ochoa, female   DOB: 1976-05-05, 39 y.o.   MRN: [id number]  SYMPTOMS: SX BETTER. FIBER BULKS UP STOOL TOO MUCH. NOW TAKING QOD. LESS SOILING. ONLY BLED AFTER LARGER STOOL. BURNING IS BETTER. A LITTLE ITCHING. NO RECTAL BLEEDING,PRESSURE, OR, BURNING.  CONSTIPATION: YES DIARRHEA: YES  STRAINS WITH BMs: YES  TIME SPENT ON TOILET: 5 MINS TISSUE POKES OUT OF RECTUM: NO FIBER SUPPLEMENTS: YES GLASSES OF WATER/DAY: 6-8-YES   ADDITIONAL QUESTIONS:  LATEX ALLERGY: NO PREGNANT: NO ERECTILE DYSFUNCTION MEDS OR NITRATES: NO ANTICOAGULATION/ANTIPLATELET MEDS: NO DIAGNOSED WITH CROHN'S DISEASE, PROCTITIS, PORTAL HTN, OR ANAL/RECTAL CA: NO TAKING IMMUNOSUPPRESSANTS/XRT: NO  PLAN: 1. [facility] BANDING TODAY  PROCEDURE TECHNIQUE: BENEFITS RISK EXPLAINED TO PT. ONE [facility] BAND PLACED IN    R POSTERIOR AND ANTERIOR POSITION. POST-BANDING RECTAL EXAM REVEALED GOOD PLACEMENT. EXAM NON-TENDER.

## 2014-11-28 NOTE — Patient Instructions (Signed)
DRINK WATER.  EAT FIBER.  FOLLOW UP IN 3 WEEKS.

## 2014-11-28 NOTE — Assessment & Plan Note (Signed)
HIGH FIBER DIET FOLLOW UP IN 3 WEEKS.

## 2014-12-03 ENCOUNTER — Telehealth: Payer: 59 | Admitting: Family

## 2014-12-03 DIAGNOSIS — J0120 Acute ethmoidal sinusitis, unspecified: Secondary | ICD-10-CM

## 2014-12-03 MED ORDER — AMOXICILLIN-POT CLAVULANATE 875-125 MG PO TABS: 1.0000 | ORAL_TABLET | Freq: Two times a day (BID) | ORAL | Status: DC

## 2014-12-03 NOTE — Progress Notes (Signed)

## 2014-12-19 ENCOUNTER — Encounter: Payer: Self-pay | Admitting: Gastroenterology

## 2014-12-19 ENCOUNTER — Ambulatory Visit (INDEPENDENT_AMBULATORY_CARE_PROVIDER_SITE_OTHER): Payer: 59 | Admitting: Gastroenterology

## 2014-12-19 VITALS — BP 147/93 | HR 98 | Temp 98.4°F | Ht 66.0 in | Wt 246.4 lb

## 2014-12-19 DIAGNOSIS — K642 Third degree hemorrhoids: Secondary | ICD-10-CM

## 2014-12-19 DIAGNOSIS — K648 Other hemorrhoids: Secondary | ICD-10-CM

## 2014-12-19 NOTE — Progress Notes (Signed)
   Subjective:    Patient ID: Cynthia Ochoa, female    DOB: 07-27-1976, 39 y.o.   MRN: [id number]  HPI SEE NOTE ABOVE.  Past Medical History  Diagnosis Date  . Abnormal Pap smear   . Fibroid   . IBS (irritable bowel syndrome)   . Hx of Salmonella gastroenteritis   . Hx of campylobacteriosis 2005   Past Surgical History  Procedure Laterality Date  . Cystectomy      abdomen  . Wisdom tooth extraction    . Colposcopy    . Gynecologic cryosurgery    . Vaginal delivery  04/07/13  . Cervical conization w/bx N/A 09/07/2013    Procedure: CONIZATION CERVIX WITH BIOPSY/REMOVAL OF IUD AND INSERTION;  Surgeon: Cyril Mourning, MD;  Location: [facility];  Service: Gynecology;  Laterality: N/A;  . Colonoscopy  2005 NUR    I/E HEMORRHOIDS, TIC AT HF, NL TI    Allergies  Allergen Reactions  . Adhesive [Tape]     rash   Current Outpatient Prescriptions  Medication Sig Dispense Refill  . buPROPion (WELLBUTRIN SR) 150 MG 12 hr tablet Take 150 mg by mouth daily.    . [facility] FIBER GUMMIES 2 G CHEW Chew 1 tablet by mouth.    . Diethylpropion HCl (TENUATE PO) Take 75 mg by mouth daily.    .      .      .       Review of Systems    Objective:   Physical Exam  Constitutional: She is oriented to person, place, and time. She appears well-developed and well-nourished. No distress.  HENT:  Head: Normocephalic and atraumatic.  Mouth/Throat: No oropharyngeal exudate.  Eyes: No scleral icterus.  Cardiovascular: Normal rate, regular rhythm and normal heart sounds.   Pulmonary/Chest: Effort normal and breath sounds normal. No respiratory distress.  Abdominal: Soft. Bowel sounds are normal. She exhibits no distension. There is no tenderness.  Musculoskeletal: She exhibits no edema.  Lymphadenopathy:    She has no cervical adenopathy.  Neurological: She is alert and oriented to person, place, and time.  Psychiatric: She has a normal mood and affect.  Vitals reviewed.         Assessment &  Plan:

## 2014-12-19 NOTE — Assessment & Plan Note (Signed)
HAD RECTAL BLEEDING AFTER PLACING 2 BANDS.  DRINK WATER EAT FIBER REPEAT [facility] BANDING FOR LEFT LATERAL BAND IN 2-3 WEEKS.

## 2014-12-19 NOTE — Progress Notes (Signed)
SYMPTOMS: RECTAL BLEEDING: WORSE SINCE WE STARTED, PAIN: BETTER, ITCHING: BETTER, SOILING: BETTER.  CONSTIPATION: YES DIARRHEA: YES  STRAINS WITH BMs: YES  TIME SPENT ON TOILET: 5 MINS TISSUE POKES OUT OF RECTUM: YES- GOES BACK BY ITSELF FIBER SUPPLEMENTS: SOMETIMES-FIBER GUMMIES GLASSES OF WATER/DAY: 6-8-YES   ADDITIONAL QUESTIONS:  LATEX ALLERGY: NO PREGNANT: NO ERECTILE DYSFUNCTION MEDS OR NITRATES: NO ANTICOAGULATION/ANTIPLATELET MEDS: NO DIAGNOSED WITH CROHN'S DISEASE, PROCTITIS, PORTAL HTN, OR ANAL/RECTAL CA: NO TAKING IMMUNOSUPPRESSANTS/XRT: NO  PLAN: 1. [facility] BANDING TODAY  PROCEDURE TECHNIQUE: BENEFITS RISK EXPLAINED TO PT.  ANOSCOPE PLACED. REDUNDANT TISSUE IN R ANTERIOR WITH SML CHERRY RED SPOT, R POSTERIO, AND LEFT LATERAL HEMORRHOID BUNDLE. ONE [facility] BAND PLACED IN  RIGHT ANTERIOR  POSITION. POST-BANDING RECTAL EXAM REVEALED GOOD PLACEMENT. EXAM NON-TENDER.

## 2014-12-19 NOTE — Patient Instructions (Signed)
DRINK WATER TO KEEP YOUR URINE LIGHT YELLOW.  EAT FIBER.  FOLLOW UP IN 3 WEEKS.

## 2014-12-21 MED ORDER — CYCLOBENZAPRINE HCL 5 MG PO TABS: ORAL_TABLET | ORAL | Status: DC

## 2014-12-21 NOTE — Addendum Note (Signed)
Addended by: Danie Binder on: 12/21/2014 12:22 PM   Modules accepted: Orders

## 2014-12-21 NOTE — Progress Notes (Signed)
PT FELL ON BLACK ICE AT WORK AND HURT HER BACK. RX FLEXERIL SENT.

## 2014-12-27 ENCOUNTER — Ambulatory Visit: Payer: 59 | Admitting: Gastroenterology

## 2015-05-27 ENCOUNTER — Telehealth: Payer: Self-pay | Admitting: Gastroenterology

## 2015-05-27 MED ORDER — PROMETHAZINE HCL 12.5 MG RE SUPP: RECTAL | Status: DC

## 2015-05-27 NOTE — Telephone Encounter (Signed)
Pt called. Needs Phenergan. Rx sent.

## 2015-05-30 ENCOUNTER — Other Ambulatory Visit: Payer: Self-pay | Admitting: Obstetrics and Gynecology

## 2015-05-31 LAB — CYTOLOGY - PAP

## 2015-10-12 ENCOUNTER — Emergency Department (HOSPITAL_COMMUNITY)
Admission: EM | Admit: 2015-10-12 | Discharge: 2015-10-13 | Disposition: A | Discharge status: Home / Self Care | Payer: 59 | Attending: Emergency Medicine | Admitting: Emergency Medicine

## 2015-10-12 ENCOUNTER — Encounter (HOSPITAL_COMMUNITY): Payer: Self-pay

## 2015-10-12 ENCOUNTER — Emergency Department (HOSPITAL_COMMUNITY): Payer: 59

## 2015-10-12 DIAGNOSIS — Z86018 Personal history of other benign neoplasm: Secondary | ICD-10-CM | POA: Diagnosis not present

## 2015-10-12 DIAGNOSIS — Z8619 Personal history of other infectious and parasitic diseases: Secondary | ICD-10-CM | POA: Insufficient documentation

## 2015-10-12 DIAGNOSIS — K5792 Diverticulitis of intestine, part unspecified, without perforation or abscess without bleeding: Secondary | ICD-10-CM | POA: Insufficient documentation

## 2015-10-12 DIAGNOSIS — R1032 Left lower quadrant pain: Secondary | ICD-10-CM | POA: Diagnosis present

## 2015-10-12 DIAGNOSIS — Z3202 Encounter for pregnancy test, result negative: Secondary | ICD-10-CM | POA: Insufficient documentation

## 2015-10-12 LAB — CBC WITH DIFFERENTIAL/PLATELET
BASOS ABS: 0 10*3/uL (ref 0.0–0.1)
Basophils Relative: 0 %
EOS PCT: 1 %
Eosinophils Absolute: 0.1 10*3/uL (ref 0.0–0.7)
HEMATOCRIT: 43.2 % (ref 36.0–46.0)
Hemoglobin: 14.9 g/dL (ref 12.0–15.0)
LYMPHS ABS: 2.8 10*3/uL (ref 0.7–4.0)
Lymphocytes Relative: 20 %
MCH: 29.4 pg (ref 26.0–34.0)
MCHC: 34.5 g/dL (ref 30.0–36.0)
MCV: 85.4 fL (ref 78.0–100.0)
Monocytes Absolute: 0.9 10*3/uL (ref 0.1–1.0)
Monocytes Relative: 6 %
Neutro Abs: 10.4 10*3/uL — ABNORMAL HIGH (ref 1.7–7.7)
Neutrophils Relative %: 73 %
Platelets: 241 10*3/uL (ref 150–400)
RBC: 5.06 MIL/uL (ref 3.87–5.11)
RDW: 12.9 % (ref 11.5–15.5)
WBC: 14.3 10*3/uL — AB (ref 4.0–10.5)

## 2015-10-12 LAB — URINE MICROSCOPIC-ADD ON

## 2015-10-12 LAB — COMPREHENSIVE METABOLIC PANEL
ALK PHOS: 62 U/L (ref 38–126)
ALT: 17 U/L (ref 14–54)
AST: 19 U/L (ref 15–41)
Albumin: 4.4 g/dL (ref 3.5–5.0)
Anion gap: 7 (ref 5–15)
BILIRUBIN TOTAL: 0.5 mg/dL (ref 0.3–1.2)
BUN: 12 mg/dL (ref 6–20)
CALCIUM: 9.3 mg/dL (ref 8.9–10.3)
CO2: 27 mmol/L (ref 22–32)
CREATININE: 0.88 mg/dL (ref 0.44–1.00)
Chloride: 106 mmol/L (ref 101–111)
Glucose, Bld: 99 mg/dL (ref 65–99)
Potassium: 3.5 mmol/L (ref 3.5–5.1)
Sodium: 140 mmol/L (ref 135–145)
TOTAL PROTEIN: 7.6 g/dL (ref 6.5–8.1)

## 2015-10-12 LAB — PREGNANCY, URINE: Preg Test, Ur: NEGATIVE

## 2015-10-12 LAB — LIPASE, BLOOD: LIPASE: 26 U/L (ref 11–51)

## 2015-10-12 LAB — URINALYSIS, ROUTINE W REFLEX MICROSCOPIC
BILIRUBIN URINE: NEGATIVE
Glucose, UA: NEGATIVE mg/dL
KETONES UR: NEGATIVE mg/dL
Leukocytes, UA: NEGATIVE
NITRITE: NEGATIVE
Protein, ur: NEGATIVE mg/dL
SPECIFIC GRAVITY, URINE: 1.02 (ref 1.005–1.030)
pH: 6 (ref 5.0–8.0)

## 2015-10-12 MED ORDER — METRONIDAZOLE 500 MG PO TABS: 500.0000 mg | ORAL_TABLET | Freq: Four times a day (QID) | ORAL | Status: DC

## 2015-10-12 MED ORDER — IOHEXOL 300 MG/ML  SOLN
100.0000 mL | Freq: Once | INTRAMUSCULAR | Status: AC | PRN
  Administered 2015-10-12: 100 mL via INTRAVENOUS

## 2015-10-12 MED ORDER — CIPROFLOXACIN IN D5W 400 MG/200ML IV SOLN
400.0000 mg | Freq: Once | INTRAVENOUS | Status: AC
  Administered 2015-10-12: 400 mg via INTRAVENOUS
  Filled 2015-10-12: qty 200

## 2015-10-12 MED ORDER — HYDROCODONE-ACETAMINOPHEN 5-325 MG PO TABS: 1.0000 | ORAL_TABLET | Freq: Four times a day (QID) | ORAL | Status: DC | PRN

## 2015-10-12 MED ORDER — HYDROMORPHONE HCL 1 MG/ML IJ SOLN
1.0000 mg | Freq: Once | INTRAMUSCULAR | Status: AC
  Administered 2015-10-12: 1 mg via INTRAVENOUS
  Filled 2015-10-12: qty 1

## 2015-10-12 MED ORDER — METRONIDAZOLE IN NACL 5-0.79 MG/ML-% IV SOLN
500.0000 mg | Freq: Once | INTRAVENOUS | Status: AC
Start: 2015-10-12 — End: 2015-10-13
  Administered 2015-10-12: 500 mg via INTRAVENOUS
  Filled 2015-10-12: qty 100

## 2015-10-12 MED ORDER — BARIUM SULFATE 2 % PO SUSP
450.0000 mL | Freq: Once | ORAL | Status: DC
  Filled 2015-10-12: qty 450

## 2015-10-12 MED ORDER — ONDANSETRON 4 MG PO TBDP: ORAL_TABLET | ORAL | Status: DC

## 2015-10-12 MED ORDER — CIPROFLOXACIN HCL 500 MG PO TABS: 500.0000 mg | ORAL_TABLET | Freq: Two times a day (BID) | ORAL | Status: DC

## 2015-10-12 NOTE — ED Provider Notes (Signed)
CSN: [id number]     Arrival date & time 10/12/15  1903 History   First MD Initiated Contact with Patient 10/12/15 1929     Chief Complaint  Patient presents with  . Abdominal Pain     (Consider location/radiation/quality/duration/timing/severity/associated sxs/prior Treatment) Patient is a 39 y.o. female presenting with abdominal pain. The history is provided by the patient (The patient complains of lower abdominal pain worse on left lower quadrant).  Abdominal Pain Pain location:  LLQ Pain quality: aching   Pain radiates to:  Does not radiate Pain severity:  Moderate Onset quality:  Sudden Timing:  Constant Progression:  Waxing and waning Chronicity:  New Associated symptoms: no chest pain, no cough, no diarrhea, no fatigue and no hematuria     Past Medical History  Diagnosis Date  . Abnormal Pap smear   . Fibroid   . IBS (irritable bowel syndrome)   . Hx of Salmonella gastroenteritis   . Hx of campylobacteriosis 2005   Past Surgical History  Procedure Laterality Date  . Cystectomy      abdomen  . Wisdom tooth extraction    . Colposcopy    . Gynecologic cryosurgery    . Vaginal delivery  04/07/13  . Cervical conization w/bx N/A 09/07/2013    Procedure: CONIZATION CERVIX WITH BIOPSY/REMOVAL OF IUD AND INSERTION;  Surgeon: Cyril Mourning, MD;  Location: [facility];  Service: Gynecology;  Laterality: N/A;  . Colonoscopy  2005 NUR    I/E HEMORRHOIDS, TIC AT HF, NL TI   Family History  Problem Relation Age of Onset  . Hypertension Mother   . Hypertension Father   . Melanoma Paternal Uncle   . Prader-Willi syndrome Brother   . Diabetes Brother   . Hypertension Brother   . COPD Maternal Grandfather   . COPD Paternal Grandmother    Social History  Substance Use Topics  . Smoking status: Never Smoker   . Smokeless tobacco: None  . Alcohol Use: No   OB History    Gravida Para Term Preterm AB TAB SAB Ectopic Multiple Living   1 1 1       1      Review of Systems   Constitutional: Negative for appetite change and fatigue.  HENT: Negative for congestion, ear discharge and sinus pressure.   Eyes: Negative for discharge.  Respiratory: Negative for cough.   Cardiovascular: Negative for chest pain.  Gastrointestinal: Positive for abdominal pain. Negative for diarrhea.  Genitourinary: Negative for frequency and hematuria.  Musculoskeletal: Negative for back pain.  Skin: Negative for rash.  Neurological: Negative for seizures and headaches.  Psychiatric/Behavioral: Negative for hallucinations.      Allergies  Adhesive  Home Medications   Prior to Admission medications   Medication Sig Start Date End Date Taking? Authorizing Provider  phentermine 37.5 MG capsule Take 37.5 mg by mouth every morning.   Yes Historical Provider, MD  amoxicillin (AMOXIL) 875 MG tablet Take 1 tablet (875 mg total) by mouth 2 (two) times daily. Patient not taking: Reported on 11/08/2014 04/16/14   Mancel Bale, PA-C  amoxicillin-clavulanate (AUGMENTIN) 875-125 MG per tablet Take 1 tablet by mouth 2 (two) times daily. Patient not taking: Reported on 12/19/2014 12/03/14   Benjamine Mola, FNP  ciprofloxacin (CIPRO) 500 MG tablet Take 1 tablet (500 mg total) by mouth 2 (two) times daily. 10/12/15   Milton Ferguson, MD  [facility] FIBER GUMMIES 2 G CHEW Chew 1 tablet by mouth.    Historical Provider, MD  cyclobenzaprine (FLEXERIL) 5 MG tablet 1-2 PO Q6H PRN BACK SPASM 12/21/14   Danie Binder, MD  HYDROcodone-acetaminophen (NORCO/VICODIN) 5-325 MG tablet Take 1 tablet by mouth every 6 (six) hours as needed for moderate pain. 10/12/15   Milton Ferguson, MD  HYDROcodone-homatropine [facility]) 5-1.5 MG/5ML syrup Take 5 mLs by mouth every 8 (eight) hours as needed for cough. Patient not taking: Reported on 12/19/2014 04/16/14   Mancel Bale, PA-C  metroNIDAZOLE (FLAGYL) 500 MG tablet Take 1 tablet (500 mg total) by mouth 4 (four) times daily. One po bid x 7 days 10/12/15   Milton Ferguson, MD   ondansetron [facility] ODT) 4 MG disintegrating tablet 4mg  ODT q4 hours prn nausea/vomit 10/12/15   Milton Ferguson, MD  promethazine (PHENERGAN) 12.5 MG suppository 1-2 pr q4-6h prn nausea or vomiting 05/27/15   Danie Binder, MD   BP 148/106 mmHg  Pulse 106  Temp(Src) 98.1 F (36.7 C)  Resp 20  Ht 5\' 7"  (1.702 m)  Wt 246 lb (111.585 kg)  BMI 38.52 kg/m2  SpO2 100% Physical Exam  Constitutional: She is oriented to person, place, and time. She appears well-developed.  HENT:  Head: Normocephalic.  Eyes: Conjunctivae and EOM are normal. No scleral icterus.  Neck: Neck supple. No thyromegaly present.  Cardiovascular: Normal rate and regular rhythm.  Exam reveals no gallop and no friction rub.   No murmur heard. Pulmonary/Chest: No stridor. She has no wheezes. She has no rales. She exhibits no tenderness.  Abdominal: She exhibits no distension. There is tenderness. There is no rebound.  Tender left lower quadrant moderate  Musculoskeletal: Normal range of motion. She exhibits no edema.  Lymphadenopathy:    She has no cervical adenopathy.  Neurological: She is oriented to person, place, and time. She exhibits normal muscle tone. Coordination normal.  Skin: No rash noted. No erythema.  Psychiatric: She has a normal mood and affect. Her behavior is normal.    ED Course  Procedures (including critical care time) Labs Review Labs Reviewed  CBC WITH DIFFERENTIAL/PLATELET - Abnormal; Notable for the following:    WBC 14.3 (*)    Neutro Abs 10.4 (*)    All other components within normal limits  URINALYSIS, ROUTINE W REFLEX MICROSCOPIC (NOT AT [facility]) - Abnormal; Notable for the following:    Hgb urine dipstick TRACE (*)    All other components within normal limits  URINE MICROSCOPIC-ADD ON - Abnormal; Notable for the following:    Squamous Epithelial / LPF 0-5 (*)    Bacteria, UA RARE (*)    All other components within normal limits  COMPREHENSIVE METABOLIC PANEL  LIPASE, BLOOD   PREGNANCY, URINE    Imaging Review Ct Abdomen Pelvis W Contrast  10/12/2015  CLINICAL DATA:  Left lower abdominal pain, onset 3 weeks prior but progressed today. EXAM: CT ABDOMEN AND PELVIS WITH CONTRAST TECHNIQUE: Multidetector CT imaging of the abdomen and pelvis was performed using the standard protocol following bolus administration of intravenous contrast. CONTRAST:  142mL OMNIPAQUE IOHEXOL 300 MG/ML  SOLN COMPARISON:  CT pelvis 08/30/2008 FINDINGS: Lower chest:  The included lung bases are clear. Liver: No focal lesion.  Suspect mild steatosis. Hepatobiliary: Gallbladder physiologically distended. No calcified stone. No biliary dilatation. Pancreas: No ductal dilatation or inflammation. Spleen: Normal. Adrenal glands: No nodule. Kidneys: Symmetric renal enhancement. No hydronephrosis. No perinephric stranding or focal renal abnormality. Stomach/Bowel: Acute diverticulitis in the mid transverse colon with an inflamed diverticulum, mild associated colonic wall thickening and adjacent fat stranding.  No perforation. Multiple noninflamed diverticula throughout the entire colon. Stomach physiologically distended. There are no dilated or thickened small bowel loops. The appendix is normal. Vascular/Lymphatic: No retroperitoneal adenopathy. Abdominal aorta is normal in caliber. Reproductive: Intrauterine device appropriately positioned in the uterus. There is an exophytic calcified fibroid posteriorly. Smaller fundal fibroid also seen. Ovaries are symmetric in size. Small amount of free fluid in the pelvis. Bladder: Minimally distended without stone or wall thickening. Other: No free air, ascites, or intra-abdominal fluid collection. Musculoskeletal: There are no acute or suspicious osseous abnormalities. IMPRESSION: 1. Acute diverticulitis in the mid transverse colon. No perforation or abscess. Multiple additional noninflamed diverticula throughout the remainder the colon. 2. Uterine fibroid. Electronically  Signed   By: Jeb Levering M.D.   On: 10/12/2015 22:45   I have personally reviewed and evaluated these images and lab results as part of my medical decision-making.   EKG Interpretation None      MDM   Final diagnoses:  Acute diverticulitis    CT scan shows diverticulitis patient states that she rather be treated as an outpatient. She is nontoxic not thrown up patient given prescription for Cipro and Flagyl Vicodin and Zofran and will be seen in 2-3 days with her doctor    Milton Ferguson, MD 10/12/15 [phone]

## 2015-10-12 NOTE — ED Notes (Signed)
I am having lower left back pain and lower left abdominal pain that started around 3 weeks ago and increased around 3 pm today. I went to the doctor this week to get my lab results and I was not having any problems. Nothing makes it better or worse. No changes in urination. Have been to the bathroom for bowel movements about 5 times today.

## 2015-10-12 NOTE — Discharge Instructions (Signed)
Follow up with your md Monday of Tuesday.  Return sooner if problems

## 2015-10-13 ENCOUNTER — Telehealth: Payer: Self-pay | Admitting: Internal Medicine

## 2015-10-13 MED ORDER — OXYCODONE-ACETAMINOPHEN 5-325 MG PO TABS: 2.0000 | ORAL_TABLET | ORAL | Status: DC | PRN

## 2015-10-13 MED ORDER — ONDANSETRON 8 MG PO TBDP
8.0000 mg | ORAL_TABLET | Freq: Once | ORAL | Status: AC
  Administered 2015-10-13: 8 mg via ORAL

## 2015-10-13 MED ORDER — ONDANSETRON 8 MG PO TBDP
ORAL_TABLET | ORAL | Status: AC
  Filled 2015-10-13: qty 1

## 2015-10-13 NOTE — Telephone Encounter (Signed)
Patient called back. Some confusion on the Flagyl prescription given at the ED. Patient given 14 - 500 mg Flagyl tablets. I reviewed the CT. All things considered, I recommended that she take the Cipro 500 mg twice a day for a full 14 days and take Flagyl at a dose of 500 mg 3 times a day 10 days. I have written a prescription to make up the difference in Flagyl tablets. Otherwise, previous telephone recommendations apply.

## 2015-10-13 NOTE — Telephone Encounter (Signed)
Patient called me stating she was in the ED last night. Diagnosed with diverticulitis. She asked me to review the CT scan - I reviewed- uncomplicated transverse colon diverticulitis. She is concerned about confusing instructions on her prescription. She was given 56 Flagyl 500 mg tablets and was told to take one twice daily. She was also given 28  - 500 mg Cipro tablets.  I recommended she take the Cipro twice a day for 2 weeks and Flagyl 4 times a day for 2 weeks. Drop back to a clear liquid diet for the next couple of days. Follow-up to be arranged next week depending on clinical course.

## 2015-10-15 ENCOUNTER — Emergency Department (HOSPITAL_COMMUNITY): Payer: 59

## 2015-10-15 ENCOUNTER — Encounter (HOSPITAL_COMMUNITY): Payer: Self-pay | Admitting: Emergency Medicine

## 2015-10-15 ENCOUNTER — Emergency Department (HOSPITAL_COMMUNITY)
Admission: EM | Admit: 2015-10-15 | Discharge: 2015-10-15 | Disposition: A | Discharge status: Home / Self Care | Payer: 59 | Attending: Emergency Medicine | Admitting: Emergency Medicine

## 2015-10-15 DIAGNOSIS — R1030 Lower abdominal pain, unspecified: Secondary | ICD-10-CM | POA: Diagnosis not present

## 2015-10-15 DIAGNOSIS — Z86018 Personal history of other benign neoplasm: Secondary | ICD-10-CM | POA: Insufficient documentation

## 2015-10-15 DIAGNOSIS — M79605 Pain in left leg: Secondary | ICD-10-CM

## 2015-10-15 DIAGNOSIS — M79662 Pain in left lower leg: Secondary | ICD-10-CM | POA: Insufficient documentation

## 2015-10-15 DIAGNOSIS — M79652 Pain in left thigh: Secondary | ICD-10-CM | POA: Diagnosis not present

## 2015-10-15 DIAGNOSIS — Z792 Long term (current) use of antibiotics: Secondary | ICD-10-CM | POA: Diagnosis not present

## 2015-10-15 DIAGNOSIS — Z8619 Personal history of other infectious and parasitic diseases: Secondary | ICD-10-CM | POA: Diagnosis not present

## 2015-10-15 DIAGNOSIS — Z8719 Personal history of other diseases of the digestive system: Secondary | ICD-10-CM | POA: Insufficient documentation

## 2015-10-15 DIAGNOSIS — M62838 Other muscle spasm: Secondary | ICD-10-CM | POA: Diagnosis not present

## 2015-10-15 HISTORY — DX: Diverticulitis of intestine, part unspecified, without perforation or abscess without bleeding: K57.92

## 2015-10-15 LAB — URINALYSIS, ROUTINE W REFLEX MICROSCOPIC
BILIRUBIN URINE: NEGATIVE
Glucose, UA: NEGATIVE mg/dL
Nitrite: NEGATIVE
Specific Gravity, Urine: 1.01 (ref 1.005–1.030)
pH: 5.5 (ref 5.0–8.0)

## 2015-10-15 LAB — CBC WITH DIFFERENTIAL/PLATELET
Basophils Absolute: 0 10*3/uL (ref 0.0–0.1)
Basophils Relative: 0 %
EOS PCT: 1 %
Eosinophils Absolute: 0.1 10*3/uL (ref 0.0–0.7)
HCT: 44.9 % (ref 36.0–46.0)
Hemoglobin: 16.1 g/dL — ABNORMAL HIGH (ref 12.0–15.0)
LYMPHS ABS: 2 10*3/uL (ref 0.7–4.0)
LYMPHS PCT: 20 %
MCH: 30.2 pg (ref 26.0–34.0)
MCHC: 35.9 g/dL (ref 30.0–36.0)
MCV: 84.2 fL (ref 78.0–100.0)
MONO ABS: 0.6 10*3/uL (ref 0.1–1.0)
Monocytes Relative: 6 %
Neutro Abs: 7.1 10*3/uL (ref 1.7–7.7)
Neutrophils Relative %: 73 %
PLATELETS: 272 10*3/uL (ref 150–400)
RBC: 5.33 MIL/uL — AB (ref 3.87–5.11)
RDW: 12.5 % (ref 11.5–15.5)
WBC: 9.8 10*3/uL (ref 4.0–10.5)

## 2015-10-15 LAB — COMPREHENSIVE METABOLIC PANEL
ALT: 25 U/L (ref 14–54)
AST: 23 U/L (ref 15–41)
Albumin: 4.3 g/dL (ref 3.5–5.0)
Alkaline Phosphatase: 70 U/L (ref 38–126)
Anion gap: 8 (ref 5–15)
BUN: 7 mg/dL (ref 6–20)
CALCIUM: 9.1 mg/dL (ref 8.9–10.3)
CHLORIDE: 103 mmol/L (ref 101–111)
CO2: 26 mmol/L (ref 22–32)
CREATININE: 1.04 mg/dL — AB (ref 0.44–1.00)
Glucose, Bld: 104 mg/dL — ABNORMAL HIGH (ref 65–99)
Potassium: 3.4 mmol/L — ABNORMAL LOW (ref 3.5–5.1)
Sodium: 137 mmol/L (ref 135–145)
TOTAL PROTEIN: 7.5 g/dL (ref 6.5–8.1)
Total Bilirubin: 0.4 mg/dL (ref 0.3–1.2)

## 2015-10-15 LAB — URINE MICROSCOPIC-ADD ON
Bacteria, UA: NONE SEEN
Squamous Epithelial / LPF: NONE SEEN

## 2015-10-15 LAB — CK: CK TOTAL: 71 U/L (ref 38–234)

## 2015-10-15 LAB — LIPASE, BLOOD: LIPASE: 24 U/L (ref 11–51)

## 2015-10-15 MED ORDER — POTASSIUM CHLORIDE CRYS ER 20 MEQ PO TBCR
40.0000 meq | EXTENDED_RELEASE_TABLET | Freq: Once | ORAL | Status: AC
  Administered 2015-10-15: 40 meq via ORAL
  Filled 2015-10-15: qty 2

## 2015-10-15 MED ORDER — AMOXICILLIN-POT CLAVULANATE 875-125 MG PO TABS: 1.0000 | ORAL_TABLET | Freq: Two times a day (BID) | ORAL | Status: DC

## 2015-10-15 MED ORDER — SODIUM CHLORIDE 0.9 % IV BOLUS (SEPSIS)
1000.0000 mL | Freq: Once | INTRAVENOUS | Status: AC
  Administered 2015-10-15: 1000 mL via INTRAVENOUS

## 2015-10-15 MED ORDER — DIAZEPAM 2 MG PO TABS: 2.0000 mg | ORAL_TABLET | Freq: Three times a day (TID) | ORAL | Status: DC | PRN

## 2015-10-15 NOTE — Discharge Instructions (Signed)
Muscle Cramps and Spasms Stop taking Cipro and Flagyl. Take Augmentin instead. Keep yourself hydrated. Follow up with your doctor. Return to the ED if you develop new or worsening symptoms. Muscle cramps and spasms occur when a muscle or muscles tighten and you have no control over this tightening (involuntary muscle contraction). They are a common problem and can develop in any muscle. The most common place is in the calf muscles of the leg. Both muscle cramps and muscle spasms are involuntary muscle contractions, but they also have differences:   Muscle cramps are sporadic and painful. They may last a few seconds to a quarter of an hour. Muscle cramps are often more forceful and last longer than muscle spasms.  Muscle spasms may or may not be painful. They may also last just a few seconds or much longer. CAUSES  It is uncommon for cramps or spasms to be due to a serious underlying problem. In many cases, the cause of cramps or spasms is unknown. Some common causes are:   Overexertion.   Overuse from repetitive motions (doing the same thing over and over).   Remaining in a certain position for a long period of time.   Improper preparation, form, or technique while performing a sport or activity.   Dehydration.   Injury.   Side effects of some medicines.   Abnormally low levels of the salts and ions in your blood (electrolytes), especially potassium and calcium. This could happen if you are taking water pills (diuretics) or you are pregnant.  Some underlying medical problems can make it more likely to develop cramps or spasms. These include, but are not limited to:   Diabetes.   Parkinson disease.   Hormone disorders, such as thyroid problems.   Alcohol abuse.   Diseases specific to muscles, joints, and bones.   Blood vessel disease where not enough blood is getting to the muscles.  HOME CARE INSTRUCTIONS   Stay well hydrated. Drink enough water and fluids to  keep your urine clear or pale yellow.  It may be helpful to massage, stretch, and relax the affected muscle.  For tight or tense muscles, use a warm towel, heating pad, or hot shower water directed to the affected area.  If you are sore or have pain after a cramp or spasm, applying ice to the affected area may relieve discomfort.  Put ice in a plastic bag.  Place a towel between your skin and the bag.  Leave the ice on for 15-20 minutes, 03-04 times a day.  Medicines used to treat a known cause of cramps or spasms may help reduce their frequency or severity. Only take over-the-counter or prescription medicines as directed by your caregiver. SEEK MEDICAL CARE IF:  Your cramps or spasms get more severe, more frequent, or do not improve over time.  MAKE SURE YOU:   Understand these instructions.  Will watch your condition.  Will get help right away if you are not doing well or get worse.   This information is not intended to replace advice given to you by your health care provider. Make sure you discuss any questions you have with your health care provider.   Document Released: 05/01/2002 Document Revised: 03/06/2013 Document Reviewed: 10/26/2012 Elsevier Interactive Patient Education [employer].

## 2015-10-15 NOTE — ED Notes (Addendum)
Pt reports left thigh pain for last several days. Pt denies any known injury. Pt reports was seen on Saturday and diagnosed with diverticulitis. Pt reports has not been able to eat or drink anything since Saturday. nad noted. Pt reports last known fever x2 days ago.

## 2015-10-15 NOTE — ED Provider Notes (Signed)
CSN: [id number]     Arrival date & time 10/15/15  1019 History  By signing my name below, I, Terressa Koyanagi, attest that this documentation has been prepared under the direction and in the presence of No att. providers found. Electronically Signed: Terressa Koyanagi, ED Scribe. 10/15/2015. 11:35 AM.  Chief Complaint  Patient presents with  . Leg Pain   The history is provided by the patient. No language interpreter was used.   PCP: Wende Neighbors, MD HPI Comments: Cynthia Ochoa is a 39 y.o. female, with PMHx noted below including diverticulitis, who presents to the Emergency Department complaining of atraumatic, severe, left thigh pain radiating to the knee only onset 2 days ago. Associated Sx include mild back pain. Pt also complains of loss of appetite for the past three days. Pt denies diarrhea, vaginal bleeding, vaginal discharge, abd pain, dysuria, chest pain, SOB, radiating pain down the legs, numbness/tingling of BLE, bladder or urinary continence, fever. Pt denies any hx of blood clots.  Past Medical History  Diagnosis Date  . Abnormal Pap smear   . Fibroid   . IBS (irritable bowel syndrome)   . Hx of Salmonella gastroenteritis   . Hx of campylobacteriosis 2005  . Diverticulitis    Past Surgical History  Procedure Laterality Date  . Cystectomy      abdomen  . Wisdom tooth extraction    . Colposcopy    . Gynecologic cryosurgery    . Vaginal delivery  04/07/13  . Cervical conization w/bx N/A 09/07/2013    Procedure: CONIZATION CERVIX WITH BIOPSY/REMOVAL OF IUD AND INSERTION;  Surgeon: Cyril Mourning, MD;  Location: [facility];  Service: Gynecology;  Laterality: N/A;  . Colonoscopy  2005 NUR    I/E HEMORRHOIDS, TIC AT HF, NL TI   Family History  Problem Relation Age of Onset  . Hypertension Mother   . Hypertension Father   . Melanoma Paternal Uncle   . Prader-Willi syndrome Brother   . Diabetes Brother   . Hypertension Brother   . COPD Maternal Grandfather   . COPD Paternal  Grandmother    Social History  Substance Use Topics  . Smoking status: Never Smoker   . Smokeless tobacco: None  . Alcohol Use: No   OB History    Gravida Para Term Preterm AB TAB SAB Ectopic Multiple Living   1 1 1       1      Review of Systems 10 Systems reviewed and all are negative for acute change except as noted in the HPI. Allergies  Adhesive  Home Medications   Prior to Admission medications   Medication Sig Start Date End Date Taking? Authorizing Provider  ciprofloxacin (CIPRO) 500 MG tablet Take 1 tablet (500 mg total) by mouth 2 (two) times daily. 10/12/15  Yes Milton Ferguson, MD  HYDROcodone-acetaminophen (NORCO/VICODIN) 5-325 MG tablet Take 1 tablet by mouth every 6 (six) hours as needed for moderate pain. 10/12/15  Yes Milton Ferguson, MD  metroNIDAZOLE (FLAGYL) 500 MG tablet Take 1 tablet (500 mg total) by mouth 4 (four) times daily. One po bid x 7 days 10/12/15  Yes Milton Ferguson, MD  oxyCODONE-acetaminophen (PERCOCET/ROXICET) 5-325 MG tablet Take 2 tablets by mouth every 4 (four) hours as needed for severe pain. 10/13/15  Yes Merryl Hacker, MD  phentermine 37.5 MG capsule Take 37.5 mg by mouth every morning.   Yes Historical Provider, MD  amoxicillin-clavulanate (AUGMENTIN) 875-125 MG tablet Take 1 tablet by mouth every 12 (twelve) hours.  10/15/15   Ezequiel Essex, MD  cyclobenzaprine (FLEXERIL) 5 MG tablet 1-2 PO Q6H PRN BACK SPASM Patient not taking: Reported on 10/15/2015 12/21/14   Danie Binder, MD  diazepam (VALIUM) 2 MG tablet Take 1 tablet (2 mg total) by mouth every 8 (eight) hours as needed for muscle spasms. 10/15/15   Ezequiel Essex, MD  ondansetron (ZOFRAN ODT) 4 MG disintegrating tablet 4mg  ODT q4 hours prn nausea/vomit Patient not taking: Reported on 10/15/2015 10/12/15   Milton Ferguson, MD  promethazine (PHENERGAN) 12.5 MG suppository 1-2 pr q4-6h prn nausea or vomiting Patient not taking: Reported on 10/15/2015 05/27/15   Danie Binder, MD    Triage Vitals: BP 149/96 mmHg  Pulse 97  Temp(Src) 98.2 F (36.8 C) (Oral)  Resp 18  Ht 5\' 7"  (1.702 m)  Wt 242 lb (109.77 kg)  BMI 37.89 kg/m2  SpO2 100% Physical Exam  Constitutional: She is oriented to person, place, and time. She appears well-developed and well-nourished. No distress.  HENT:  Head: Normocephalic and atraumatic.  Mouth/Throat: Oropharynx is clear and moist. No oropharyngeal exudate.  Eyes: Conjunctivae and EOM are normal. Pupils are equal, round, and reactive to light.  Neck: Normal range of motion. Neck supple.  No meningismus.  Cardiovascular: Normal rate, regular rhythm, normal heart sounds and intact distal pulses.   No murmur heard. Pulmonary/Chest: Effort normal and breath sounds normal. No respiratory distress.  Abdominal: Soft. There is tenderness in the suprapubic area. There is no rebound, no guarding and no CVA tenderness.  Musculoskeletal: Normal range of motion. She exhibits tenderness. She exhibits no edema.       Cervical back: She exhibits no tenderness.  Left thigh has spasm laterally. FROM of left hip and knee. Intact DP and PT pulses. Compartment soft.     Neurological: She is alert and oriented to person, place, and time. No cranial nerve deficit. She exhibits normal muscle tone. Coordination normal.  No ataxia on finger to nose bilaterally. No pronator drift. 5/5 strength throughout. CN 2-12 intact.Equal grip strength. Sensation intact.  5/5 strength in bilateral lower extremities. Ankle plantar and dorsiflexion intact. Great toe extension intact bilaterally. +2 DP and PT pulses. +2 patellar reflexes bilaterally. Normal gait.  Skin: Skin is warm.  Psychiatric: She has a normal mood and affect. Her behavior is normal.  Nursing note and vitals reviewed.   ED Course  Procedures (including critical care time) DIAGNOSTIC STUDIES: Oxygen Saturation is 100% on ra, nl by my interpretation.    COORDINATION OF CARE: 11:31 AM: Discussed  treatment plan which includes imaging, labs, and meds with pt at bedside; patient verbalizes understanding and agrees with treatment plan. Pt offered pain meds but declined at this time.   Labs Review Labs Reviewed  CBC WITH DIFFERENTIAL/PLATELET - Abnormal; Notable for the following:    RBC 5.33 (*)    Hemoglobin 16.1 (*)    All other components within normal limits  COMPREHENSIVE METABOLIC PANEL - Abnormal; Notable for the following:    Potassium 3.4 (*)    Glucose, Bld 104 (*)    Creatinine, Ser 1.04 (*)    All other components within normal limits  URINALYSIS, ROUTINE W REFLEX MICROSCOPIC (NOT AT [facility]) - Abnormal; Notable for the following:    Hgb urine dipstick TRACE (*)    Ketones, ur TRACE (*)    Protein, ur TRACE (*)    Leukocytes, UA TRACE (*)    All other components within normal limits  LIPASE, BLOOD  CK  URINE MICROSCOPIC-ADD ON    Imaging Review US Venous Img Lower Unilateral Left  10/15/2015  CLINICAL DATA:  Pain x3 days EXAM: LEFT    LOWER EXTREMITY VENOUS DOPPLER ULTRASOUND TECHNIQUE: Gray-scale sonography with compression, as well as color and duplex ultrasound, were performed to evaluate the deep venous system from the level of the common femoral vein through the popliteal and proximal calf veins. COMPARISON:  None FINDINGS: Normal compressibility of the common femoral, superficial femoral, and popliteal veins, as well as the proximal calf veins. No filling defects to suggest DVT on grayscale or color Doppler imaging. Doppler waveforms show normal direction of venous flow, normal respiratory phasicity and response to augmentation. Additional imaging in the lateral mid thigh area of pain reveals no ultrasound abnormality. Survey views of the contralateral common femoral vein are unremarkable. IMPRESSION: 1. No evidence of lower extremity deep vein thrombosis, LEFT. Electronically Signed   By: Lucrezia Europe M.D.   On: 10/15/2015 12:44   I have personally reviewed and  evaluated these images and lab results as part of my medical decision-making.  MDM   Final diagnoses:  Muscle spasm  Pain of left lower extremity   2 days of atraumatic left thigh pain. Diagnosed with diverticulitis 3 days ago and taking Cipro and Flagyl. No fever. Only one episode of vomiting after leaving the ED.  Distal pulses intact. Compartments soft. Strength and sensation intact.  Doppler negative for DVT. Patient declines pain medication. CK and renal function are normal. Mild hypokalemia.  Suspect muscle spasm. Question whether may be related to fluoroquinolone use. Will stop cipro/flagyl and switch to augmentin.  Abdomen soft and nontender.  Potassium replaced.  IVF and PO fluids given.  Continue PO hydrtion at home. Short course of muscle relaxers given.  Return precautions discussed.   I personally performed the services described in this documentation, which was scribed in my presence. The recorded information has been reviewed and is accurate.   Ezequiel Essex, MD 10/15/15 [phone]

## 2015-10-29 ENCOUNTER — Other Ambulatory Visit: Payer: Self-pay

## 2015-10-29 ENCOUNTER — Telehealth: Payer: Self-pay | Admitting: Gastroenterology

## 2015-10-29 DIAGNOSIS — K5732 Diverticulitis of large intestine without perforation or abscess without bleeding: Secondary | ICD-10-CM

## 2015-10-29 NOTE — Telephone Encounter (Signed)
Pt is aware and set up for TCS on 11/12/2015. Pt will  Come by office to pick up instructions and prep kit.

## 2015-10-29 NOTE — Telephone Encounter (Signed)
SCHEDULE TCS DEC 20 WITH PREPOPIK. FULL LIQUID DIET DAY BEFORE.  Full Liquid Diet A high-calorie, high-protein supplement should be used to meet your nutritional requirements when the full liquid diet is continued for more than 2 or 3 days. If this diet is to be used for an extended period of time (more than 7 days), a multivitamin should be considered.  Breads and Starches  Allowed: None are allowed except crackers pureed (made into a thick, smooth soup) in soup.   Avoid: Any others.    Potatoes/Pasta/Rice  Allowed: ANY ITEM AS A SOUP OR SMALL PLATE OF MASHED POTATOES OR SCRAMBLED EGGS.       Vegetables  Allowed: Strained tomato or vegetable juice. Vegetables pureed in soup.   Avoid: Any others.    Fruit  Allowed: Any strained fruit juices and fruit drinks. Include 1 serving of citrus or vitamin C-enriched fruit juice daily.   Avoid: Any others.  Meat and Meat Substitutes  Allowed: Egg  Avoid: Any meat, fish, or fowl. All cheese.  Milk  Allowed: SOY Milk beverages, including milk shakes and instant breakfast mixes. Smooth yogurt.   Avoid: Any others. Avoid dairy products if not tolerated.    Soups and Combination Foods  Allowed: Broth, strained cream soups. Strained, broth-based soups.   Avoid: Any others.    Desserts and Sweets  Allowed: flavored gelatin, tapioca, ice cream, sherbet, smooth pudding, junket, fruit ices, frozen ice pops, pudding pops, frozen fudge pops, chocolate syrup. Sugar, honey, jelly, syrup.   Avoid: Any others.  Fats and Oils  Allowed: Margarine, butter, cream, sour cream, oils.   Avoid: Any others.  Beverages  Allowed: All.   Avoid: None.  Condiments  Allowed: Iodized salt, pepper, spices, flavorings. Cocoa powder.   Avoid: Any others.    SAMPLE MEAL PLAN Breakfast   cup orange juice.   1 OR 2 EGGS  1 cup milk.   1 cup beverage (coffee or tea).   Cream or sugar, if desired.    Midmorning Snack  2  SCRAMBLED OR HARD BOILED EGG   Lunch  1 cup cream soup.    cup fruit juice.   1 cup milk.    cup custard.   1 cup beverage (coffee or tea).   Cream or sugar, if desired.    Midafternoon Snack  1 cup milk shake.  Dinner  1 cup cream soup.    cup fruit juice.   1 cup MILK    cup pudding.   1 cup beverage (coffee or tea).   Cream or sugar, if desired.  Evening Snack  1 cup supplement.  To increase calories, add sugar, cream, butter, or margarine if possible. Nutritional supplements will also increase the total calories.

## 2015-11-12 ENCOUNTER — Encounter (HOSPITAL_COMMUNITY)
Admission: EM | Disposition: A | Discharge status: Home / Self Care | Payer: Self-pay | Source: Home / Self Care | Attending: Internal Medicine

## 2015-11-12 ENCOUNTER — Encounter (HOSPITAL_COMMUNITY): Payer: Self-pay | Admitting: Emergency Medicine

## 2015-11-12 ENCOUNTER — Emergency Department (HOSPITAL_COMMUNITY): Payer: 59

## 2015-11-12 ENCOUNTER — Inpatient Hospital Stay (HOSPITAL_COMMUNITY)
Admission: EM | Admit: 2015-11-12 | Discharge: 2015-11-14 | DRG: 392 | Disposition: A | Discharge status: Home / Self Care | Payer: 59 | Attending: Internal Medicine | Admitting: Internal Medicine

## 2015-11-12 ENCOUNTER — Ambulatory Visit (HOSPITAL_COMMUNITY): Admission: RE | Admit: 2015-11-12 | Payer: 59 | Source: Ambulatory Visit | Admitting: Gastroenterology

## 2015-11-12 DIAGNOSIS — K631 Perforation of intestine (nontraumatic): Secondary | ICD-10-CM

## 2015-11-12 DIAGNOSIS — Z91048 Other nonmedicinal substance allergy status: Secondary | ICD-10-CM | POA: Diagnosis not present

## 2015-11-12 DIAGNOSIS — K589 Irritable bowel syndrome without diarrhea: Secondary | ICD-10-CM | POA: Diagnosis present

## 2015-11-12 DIAGNOSIS — Z885 Allergy status to narcotic agent status: Secondary | ICD-10-CM

## 2015-11-12 DIAGNOSIS — K5700 Diverticulitis of small intestine with perforation and abscess without bleeding: Secondary | ICD-10-CM | POA: Diagnosis present

## 2015-11-12 DIAGNOSIS — K5792 Diverticulitis of intestine, part unspecified, without perforation or abscess without bleeding: Secondary | ICD-10-CM | POA: Diagnosis not present

## 2015-11-12 DIAGNOSIS — E860 Dehydration: Secondary | ICD-10-CM

## 2015-11-12 DIAGNOSIS — Z881 Allergy status to other antibiotic agents status: Secondary | ICD-10-CM

## 2015-11-12 DIAGNOSIS — K5780 Diverticulitis of intestine, part unspecified, with perforation and abscess without bleeding: Secondary | ICD-10-CM | POA: Insufficient documentation

## 2015-11-12 DIAGNOSIS — Z8619 Personal history of other infectious and parasitic diseases: Secondary | ICD-10-CM

## 2015-11-12 DIAGNOSIS — K5732 Diverticulitis of large intestine without perforation or abscess without bleeding: Secondary | ICD-10-CM

## 2015-11-12 DIAGNOSIS — K5740 Diverticulitis of both small and large intestine with perforation and abscess without bleeding: Secondary | ICD-10-CM

## 2015-11-12 DIAGNOSIS — K5720 Diverticulitis of large intestine with perforation and abscess without bleeding: Principal | ICD-10-CM | POA: Diagnosis present

## 2015-11-12 DIAGNOSIS — R112 Nausea with vomiting, unspecified: Secondary | ICD-10-CM

## 2015-11-12 LAB — CBC WITH DIFFERENTIAL/PLATELET
BASOS ABS: 0 10*3/uL (ref 0.0–0.1)
BASOS PCT: 0 %
Eosinophils Absolute: 0 10*3/uL (ref 0.0–0.7)
Eosinophils Relative: 0 %
HEMATOCRIT: 45.8 % (ref 36.0–46.0)
Hemoglobin: 16 g/dL — ABNORMAL HIGH (ref 12.0–15.0)
Lymphocytes Relative: 7 %
Lymphs Abs: 1.3 10*3/uL (ref 0.7–4.0)
MCH: 29.8 pg (ref 26.0–34.0)
MCHC: 34.9 g/dL (ref 30.0–36.0)
MCV: 85.3 fL (ref 78.0–100.0)
MONO ABS: 1.2 10*3/uL — AB (ref 0.1–1.0)
Monocytes Relative: 6 %
NEUTROS ABS: 16.1 10*3/uL — AB (ref 1.7–7.7)
NEUTROS PCT: 87 %
Platelets: 268 10*3/uL (ref 150–400)
RBC: 5.37 MIL/uL — ABNORMAL HIGH (ref 3.87–5.11)
RDW: 12.8 % (ref 11.5–15.5)
WBC: 18.5 10*3/uL — ABNORMAL HIGH (ref 4.0–10.5)

## 2015-11-12 LAB — LACTIC ACID, PLASMA: LACTIC ACID, VENOUS: 1.1 mmol/L (ref 0.5–2.0)

## 2015-11-12 LAB — COMPREHENSIVE METABOLIC PANEL
ALK PHOS: 71 U/L (ref 38–126)
ALT: 20 U/L (ref 14–54)
ANION GAP: 10 (ref 5–15)
AST: 18 U/L (ref 15–41)
Albumin: 4.6 g/dL (ref 3.5–5.0)
BILIRUBIN TOTAL: 0.9 mg/dL (ref 0.3–1.2)
BUN: 8 mg/dL (ref 6–20)
CALCIUM: 9.4 mg/dL (ref 8.9–10.3)
CO2: 23 mmol/L (ref 22–32)
Chloride: 102 mmol/L (ref 101–111)
Creatinine, Ser: 0.81 mg/dL (ref 0.44–1.00)
GFR calc non Af Amer: >60 mL/min (ref >60)
Glucose, Bld: 141 mg/dL — ABNORMAL HIGH (ref 65–99)
Potassium: 3.9 mmol/L (ref 3.5–5.1)
Sodium: 135 mmol/L (ref 135–145)
TOTAL PROTEIN: 7.8 g/dL (ref 6.5–8.1)

## 2015-11-12 LAB — POC URINE PREG, ED: PREG TEST UR: NEGATIVE

## 2015-11-12 LAB — LIPASE, BLOOD: LIPASE: 27 U/L (ref 11–51)

## 2015-11-12 SURGERY — COLONOSCOPY
Anesthesia: Moderate Sedation

## 2015-11-12 MED ORDER — ACETAMINOPHEN 325 MG PO TABS: 650.0000 mg | ORAL_TABLET | Freq: Four times a day (QID) | ORAL | Status: DC | PRN

## 2015-11-12 MED ORDER — PIPERACILLIN-TAZOBACTAM 3.375 G IVPB
3.3750 g | Freq: Three times a day (TID) | INTRAVENOUS | Status: DC
  Administered 2015-11-12 – 2015-11-14 (×6): 3.375 g via INTRAVENOUS
  Filled 2015-11-12 (×6): qty 50

## 2015-11-12 MED ORDER — HYDROMORPHONE HCL 1 MG/ML IJ SOLN
1.0000 mg | Freq: Three times a day (TID) | INTRAMUSCULAR | Status: DC
  Administered 2015-11-12 – 2015-11-14 (×6): 1 mg via INTRAVENOUS
  Filled 2015-11-12 (×6): qty 1

## 2015-11-12 MED ORDER — ENOXAPARIN SODIUM 60 MG/0.6ML ~~LOC~~ SOLN
55.0000 mg | SUBCUTANEOUS | Status: DC
  Administered 2015-11-13 – 2015-11-14 (×2): 55 mg via SUBCUTANEOUS
  Filled 2015-11-12 (×3): qty 0.6

## 2015-11-12 MED ORDER — HYDROMORPHONE HCL 1 MG/ML IJ SOLN: 0.5000 mg | INTRAMUSCULAR | Status: DC | PRN

## 2015-11-12 MED ORDER — SENNOSIDES-DOCUSATE SODIUM 8.6-50 MG PO TABS: 1.0000 | ORAL_TABLET | Freq: Every evening | ORAL | Status: DC | PRN

## 2015-11-12 MED ORDER — SODIUM CHLORIDE 0.9 % IV SOLN: INTRAVENOUS | Status: DC

## 2015-11-12 MED ORDER — FENTANYL CITRATE (PF) 100 MCG/2ML IJ SOLN
50.0000 ug | Freq: Once | INTRAMUSCULAR | Status: AC
  Administered 2015-11-12: 50 ug via INTRAVENOUS
  Filled 2015-11-12: qty 2

## 2015-11-12 MED ORDER — MORPHINE SULFATE (PF) 2 MG/ML IV SOLN: 1.0000 mg | INTRAVENOUS | Status: DC | PRN

## 2015-11-12 MED ORDER — ONDANSETRON HCL 4 MG/2ML IJ SOLN
4.0000 mg | Freq: Three times a day (TID) | INTRAMUSCULAR | Status: DC | PRN
  Administered 2015-11-12: 4 mg via INTRAVENOUS
  Filled 2015-11-12: qty 2

## 2015-11-12 MED ORDER — MORPHINE SULFATE (PF) 4 MG/ML IV SOLN
4.0000 mg | Freq: Once | INTRAVENOUS | Status: AC
  Administered 2015-11-12: 4 mg via INTRAVENOUS
  Filled 2015-11-12: qty 1

## 2015-11-12 MED ORDER — DIPHENHYDRAMINE HCL 50 MG/ML IJ SOLN
25.0000 mg | Freq: Once | INTRAMUSCULAR | Status: AC
  Administered 2015-11-12: 25 mg via INTRAVENOUS
  Filled 2015-11-12: qty 1

## 2015-11-12 MED ORDER — FENTANYL CITRATE (PF) 100 MCG/2ML IJ SOLN
50.0000 ug | Freq: Once | INTRAMUSCULAR | Status: AC
Start: 2015-11-12 — End: 2015-11-12
  Administered 2015-11-12: 50 ug via INTRAVENOUS
  Filled 2015-11-12: qty 2

## 2015-11-12 MED ORDER — SODIUM CHLORIDE 0.9 % IV BOLUS (SEPSIS)
1000.0000 mL | Freq: Once | INTRAVENOUS | Status: AC
  Administered 2015-11-12: 1000 mL via INTRAVENOUS

## 2015-11-12 MED ORDER — ONDANSETRON HCL 4 MG/2ML IJ SOLN: 4.0000 mg | Freq: Four times a day (QID) | INTRAMUSCULAR | Status: DC | PRN

## 2015-11-12 MED ORDER — METOCLOPRAMIDE HCL 5 MG/ML IJ SOLN
10.0000 mg | Freq: Once | INTRAMUSCULAR | Status: AC
  Administered 2015-11-12: 10 mg via INTRAVENOUS
  Filled 2015-11-12: qty 2

## 2015-11-12 MED ORDER — ONDANSETRON HCL 4 MG PO TABS: 4.0000 mg | ORAL_TABLET | Freq: Four times a day (QID) | ORAL | Status: DC | PRN

## 2015-11-12 MED ORDER — ACETAMINOPHEN 650 MG RE SUPP: 650.0000 mg | Freq: Four times a day (QID) | RECTAL | Status: DC | PRN

## 2015-11-12 MED ORDER — PIPERACILLIN-TAZOBACTAM 3.375 G IVPB 30 MIN
3.3750 g | Freq: Once | INTRAVENOUS | Status: AC
  Administered 2015-11-12: 3.375 g via INTRAVENOUS
  Filled 2015-11-12: qty 50

## 2015-11-12 MED ORDER — ONDANSETRON HCL 4 MG/2ML IJ SOLN
4.0000 mg | Freq: Once | INTRAMUSCULAR | Status: AC
  Administered 2015-11-12: 4 mg via INTRAVENOUS
  Filled 2015-11-12: qty 2

## 2015-11-12 MED ORDER — ONDANSETRON HCL 4 MG/2ML IJ SOLN
4.0000 mg | Freq: Three times a day (TID) | INTRAMUSCULAR | Status: DC
  Administered 2015-11-12 – 2015-11-14 (×8): 4 mg via INTRAVENOUS
  Filled 2015-11-12 (×8): qty 2

## 2015-11-12 MED ORDER — MORPHINE SULFATE (PF) 2 MG/ML IV SOLN
4.0000 mg | INTRAVENOUS | Status: DC | PRN
  Administered 2015-11-12: 4 mg via INTRAVENOUS
  Filled 2015-11-12: qty 2

## 2015-11-12 MED ORDER — IOHEXOL 300 MG/ML  SOLN
100.0000 mL | Freq: Once | INTRAMUSCULAR | Status: AC | PRN
  Administered 2015-11-12: 100 mL via INTRAVENOUS

## 2015-11-12 MED ORDER — PIPERACILLIN-TAZOBACTAM 3.375 G IVPB: 3.3750 g | Freq: Three times a day (TID) | INTRAVENOUS | Status: DC

## 2015-11-12 MED ORDER — SODIUM CHLORIDE 0.9 % IV SOLN
INTRAVENOUS | Status: DC
  Administered 2015-11-12 – 2015-11-14 (×4): via INTRAVENOUS

## 2015-11-12 NOTE — Telephone Encounter (Signed)
PT ADMITTED WITH COMPLICATED DIVERTICULITIS(CONTAINED PERF). CANCEL TCS. OPV IN 6 WEEKS E30 DIVERTICULITIS. WILL NEED TCS AFTER  NEXT OPV.

## 2015-11-12 NOTE — ED Notes (Signed)
Patient given warm blanket per request. Requesting pain medication. MD notified. No additional orders at this time.

## 2015-11-12 NOTE — Progress Notes (Addendum)
PT SEEN AND EXAMINED. C/O LUQ ABD PAIN. CHEST PRESSURE/HEAVINESS WITH MORPHINE INFUSION. CHANGE TO DILAUDID 1 M IV Q8H AND 0.5 MG IV Q4H PRN.

## 2015-11-12 NOTE — Telephone Encounter (Signed)
APPT MADE AND 300 NURSE IS AWARE AND WILL TELL PATIENT

## 2015-11-12 NOTE — H&P (Signed)
[facility]          History and Physical    PCP:   Wende Neighbors, MD   EDP: Rolland Porter, M.D.  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 In November she had an episode of diverticulitis and was placed on Augmentin after a Cipro allergy. She is scheduled for outpatient endoscopy today and was taking her prep, however 4:30 AM she started to have severe abdominal pain as well as nausea and vomiting. She waited a few hours at home. However as her symptoms got worse she came to the hospital for evaluation. She was found to have a white count of 18.5, on CT scan was found to have diverticulitis of the distal transverse colon with associated small contained perforation and no abscess. Her prior episode of diverticulitis at the mid transverse colon has resolved. We have been asked to admit her for further evaluation and management.  Allergies:   Allergies  Allergen Reactions  . Ciprofloxacin Other (See Comments)    Nerve pain.  . Adhesive [Tape] Rash      Past Medical History  Diagnosis Date  . Abnormal Pap smear   . Fibroid   . IBS (irritable bowel syndrome)   . Hx of Salmonella gastroenteritis   . Hx of campylobacteriosis 2005  . Diverticulitis     Past Surgical History  Procedure Laterality Date  . Cystectomy      abdomen  . Wisdom tooth extraction    . Colposcopy    . Gynecologic cryosurgery    . Vaginal delivery  04/07/13  . Cervical conization w/bx N/A 09/07/2013    Procedure: CONIZATION CERVIX WITH BIOPSY/REMOVAL OF IUD AND INSERTION;  Surgeon: Cyril Mourning, MD;  Location: [facility];  Service: Gynecology;  Laterality: N/A;  . Colonoscopy  2005 NUR    I/E HEMORRHOIDS, TIC AT HF, NL TI    Prior to Admission medications   Medication Sig Start Date End Date Taking? Authorizing  Provider  acidophilus (RISAQUAD) CAPS capsule Take 1 capsule by mouth every other day.   Yes Historical Provider, MD  phentermine 37.5 MG capsule Take 37.5 mg by mouth every morning.   Yes Historical Provider, MD  amoxicillin-clavulanate (AUGMENTIN) 875-125 MG tablet Take 1 tablet by mouth every 12 (twelve) hours. Patient not taking: Reported on 11/08/2015 10/15/15   Ezequiel Essex, MD  ciprofloxacin (CIPRO) 500 MG tablet Take 1 tablet (500 mg total) by mouth 2 (two) times daily. Patient not taking: Reported on 11/08/2015 10/12/15   Milton Ferguson, MD  cyclobenzaprine (FLEXERIL) 5 MG tablet 1-2 PO Q6H PRN BACK SPASM Patient not taking: Reported on 10/15/2015 12/21/14   Danie Binder, MD  diazepam (VALIUM) 2 MG tablet Take 1 tablet (2 mg total) by mouth every 8 (eight) hours as needed for muscle spasms. Patient not taking: Reported on 11/08/2015 10/15/15   Ezequiel Essex, MD  HYDROcodone-acetaminophen (NORCO/VICODIN) 5-325 MG tablet Take 1 tablet by mouth every 6 (six) hours as needed for moderate pain. Patient not taking: Reported on 11/08/2015 10/12/15   Milton Ferguson, MD  metroNIDAZOLE (FLAGYL) 500 MG tablet Take 1 tablet (500 mg total) by mouth 4 (four) times daily. One po bid x 7 days Patient not taking: Reported on 11/08/2015 10/12/15   Broadus John  Roderic Palau, MD  ondansetron (ZOFRAN ODT) 4 MG disintegrating tablet 2m ODT q4 hours prn nausea/vomit Patient not taking: Reported on 10/15/2015 10/12/15   JMilton Ferguson MD  oxyCODONE-acetaminophen (PERCOCET/ROXICET) 5-325 MG tablet Take 2 tablets by mouth every 4 (four) hours as needed for severe pain. Patient not taking: Reported on 11/08/2015 10/13/15   CMerryl Hacker MD  promethazine (PHENERGAN) 12.5 MG suppository 1-2 pr q4-6h prn nausea or vomiting Patient not taking: Reported on 10/15/2015 05/27/15   SDanie Binder MD    Social History:  reports that she has never smoked. She does not have any smokeless tobacco history on file. She reports  that she does not drink alcohol or use illicit drugs.  Family History  Problem Relation Age of Onset  . Hypertension Mother   . Hypertension Father   . Melanoma Paternal Uncle   . Prader-Willi syndrome Brother   . Diabetes Brother   . Hypertension Brother   . COPD Maternal Grandfather   . COPD Paternal Grandmother     Review of Systems:  Constitutional: Denies fever, chills, diaphoresis, appetite change and fatigue.  HEENT: Denies photophobia, eye pain, redness, hearing loss, ear pain, congestion, sore throat, rhinorrhea, sneezing, mouth sores, trouble swallowing, neck pain, neck stiffness and tinnitus.   Respiratory: Denies SOB, DOE, cough, chest tightness,  and wheezing.   Cardiovascular: Denies chest pain, palpitations and leg swelling.  Gastrointestinal: Denies diarrhea, constipation, blood in stool and abdominal distention.  Genitourinary: Denies dysuria, urgency, frequency, hematuria, flank pain and difficulty urinating.  Endocrine: Denies: hot or cold intolerance, sweats, changes in hair or nails, polyuria, polydipsia. Musculoskeletal: Denies myalgias, back pain, joint swelling, arthralgias and gait problem.  Skin: Denies pallor, rash and wound.  Neurological: Denies dizziness, seizures, syncope, weakness, light-headedness, numbness and headaches.  Hematological: Denies adenopathy. Easy bruising, personal or family bleeding history  Psychiatric/Behavioral: Denies suicidal ideation, mood changes, confusion, nervousness, sleep disturbance and agitation   Physical Exam: Blood pressure 136/70, pulse 107, temperature 98.4 F (36.9 C), temperature source Oral, resp. rate 20, height _0  (1.702 m), weight 111.131 kg (245 lb), SpO2 98 %. General: Alert, awake, oriented 3, no distress HEENT: Normocephalic, atraumatic, pupils equal and reactive to light, extraocular movements intact Neck: Supple, no JVD, no lymphadenopathy, no bruits, no goiter Cardiovascular: Regular rate and  rhythm, no murmurs, rubs or gallops Lungs: Clear to auscultation bilaterally Abdomen: Soft, tender to palpation to the epigastric and left upper quadrant, positive bowel sounds Extremities: No clubbing, cyanosis or edema, positive pulses Neurologic: Grossly intact and nonfocal  Labs on Admission:  Results for orders placed or performed during the hospital encounter of 11/12/15 (from the past 48 hour(s))  Comprehensive metabolic panel     Status: Abnormal   Collection Time: 11/12/15  4:50 AM  Result Value Ref Range   Sodium 135 135 - 145 mmol/L   Potassium 3.9 3.5 - 5.1 mmol/L   Chloride 102 101 - 111 mmol/L   CO2 23 22 - 32 mmol/L   Glucose, Bld 141 (H) 65 - 99 mg/dL   BUN 8 6 - 20 mg/dL   Creatinine, Ser 0.81 0.44 - 1.00 mg/dL   Calcium 9.4 8.9 - 10.3 mg/dL   Total Protein 7.8 6.5 - 8.1 g/dL   Albumin 4.6 3.5 - 5.0 g/dL   AST 18 15 - 41 U/L   ALT 20 14 - 54 U/L   Alkaline Phosphatase 71 38 - 126 U/L   Total Bilirubin 0.9 0.3 - 1.2 mg/dL  GFR calc non Af Amer >60 >60 mL/min   GFR calc Af Amer >60 >60 mL/min    Comment: (NOTE) The eGFR has been calculated using the CKD EPI equation. This calculation has not been validated in all clinical situations. eGFR's persistently <60 mL/min signify possible Chronic Kidney Disease.    Anion gap 10 5 - 15  CBC with Differential     Status: Abnormal   Collection Time: 11/12/15  4:50 AM  Result Value Ref Range   WBC 18.5 (H) 4.0 - 10.5 K/uL   RBC 5.37 (H) 3.87 - 5.11 MIL/uL   Hemoglobin 16.0 (H) 12.0 - 15.0 g/dL   HCT 45.8 36.0 - 46.0 %   MCV 85.3 78.0 - 100.0 fL   MCH 29.8 26.0 - 34.0 pg   MCHC 34.9 30.0 - 36.0 g/dL   RDW 12.8 11.5 - 15.5 %   Platelets 268 150 - 400 K/uL   Neutrophils Relative % 87 %   Neutro Abs 16.1 (H) 1.7 - 7.7 K/uL   Lymphocytes Relative 7 %   Lymphs Abs 1.3 0.7 - 4.0 K/uL   Monocytes Relative 6 %   Monocytes Absolute 1.2 (H) 0.1 - 1.0 K/uL   Eosinophils Relative 0 %   Eosinophils Absolute 0.0 0.0 - 0.7  K/uL   Basophils Relative 0 %   Basophils Absolute 0.0 0.0 - 0.1 K/uL  Lactic acid, plasma     Status: None   Collection Time: 11/12/15  4:50 AM  Result Value Ref Range   Lactic Acid, Venous 1.1 0.5 - 2.0 mmol/L  Lipase, blood     Status: None   Collection Time: 11/12/15  4:50 AM  Result Value Ref Range   Lipase 27 11 - 51 U/L  POC Urine Pregnancy, ED (do NOT order at [facility])     Status: None   Collection Time: 11/12/15  6:14 AM  Result Value Ref Range   Preg Test, Ur NEGATIVE NEGATIVE    Comment:        THE SENSITIVITY OF THIS METHODOLOGY IS >24 mIU/mL     Radiological Exams on Admission: Ct Abdomen Pelvis W Contrast  11/12/2015  CLINICAL DATA:  Acute left side abdominal pain and vomiting after bowel prep for diverticulitis follow-up. EXAM: CT ABDOMEN AND PELVIS WITH CONTRAST TECHNIQUE: Multidetector CT imaging of the abdomen and pelvis was performed using the standard protocol following bolus administration of intravenous contrast. CONTRAST:  100 mL OMNIPAQUE IOHEXOL 300 MG/ML  SOLN COMPARISON:  CT abdomen and pelvis 10/12/2015 and 08/30/2008. FINDINGS: Mild dependent atelectasis is seen in the lung bases. No pleural or pericardial effusion. There is wall thickening and pericolonic stranding about the distal transverse colon with a small volume of extra luminal air about the anterior wall of the colon in this location most compatible with diverticulitis and contained perforation. No abscess is identified. Diverticulitis in the transverse colon seen on the prior examination has resolved. The colon is otherwise unremarkable. The stomach, small bowel and appendix appear normal. The gallbladder, liver, spleen, adrenal glands, pancreas and kidneys are unremarkable. IUD is in place in the uterus. Pedunculated, calcified uterine fibroid is again seen. Adnexa and urinary bladder appear normal. There is no lymphadenopathy or fluid. No focal bony abnormality is identified. IMPRESSION: Findings most  consistent with diverticulitis of the distal transverse colon with associated small contained perforation. No abscess. Diverticulitis of the mid transverse colon seen on the comparison CT scan has resolved. Electronically Signed   By: Inge Rise M.D.  On: 11/12/2015 07:17    Assessment/Plan Principal Problem:   Diverticulitis of both large and small intestine with perforation without bleeding Active Problems:   Perforated intestine (HCC)   Acute diverticulitis of intestine   Acute diverticulitis with microperforation -Agree with Zosyn started in the emergency department, will continue. -Maintain nothing by mouth except for ice chips.  -Symptomatic management with pain medications and antiemetics as required. -Consider surgery consult if pain worsens. -GI aware of admission.  Leukocytosis -Secondary to above. -Monitor trend with antibiotics.  DVT prophylaxis -Lovenox  CODE STATUS -Full code   Time Spent on Admission: 85 minutes  HERNANDEZ ACOSTA,ESTELA [facility] Pager: [phone] 11/12/2015, 12:11 PM

## 2015-11-12 NOTE — ED Notes (Signed)
Pt c/o severe abd pain that started at 1800 with vomiting. Pt states she was doing colonoscopy prep tonight.

## 2015-11-12 NOTE — Progress Notes (Signed)
ANTIBIOTIC CONSULT NOTE - INITIAL  Pharmacy Consult for zosyn Indication: intra-abdominal infection  Allergies  Allergen Reactions  . Ciprofloxacin Other (See Comments)    Nerve pain.  . Adhesive [Tape] Rash    Patient Measurements: Height: 5\' 7"  (170.2 cm) Weight: 245 lb (111.131 kg) IBW/kg (Calculated) : 61.6   Vital Signs: Temp: 98.4 F (36.9 C) (12/20 0927) Temp Source: Oral (12/20 0927) BP: 136/70 mmHg (12/20 0927) Pulse Rate: 107 (12/20 0927) Intake/Output from previous day:   Intake/Output from this shift:    Labs:  Recent Labs  11/12/15 0450  WBC 18.5*  HGB 16.0*  PLT 268  CREATININE 0.81   Estimated Creatinine Clearance: 122.2 mL/min (by C-G formula based on Cr of 0.81). No results for input(s): VANCOTROUGH, VANCOPEAK, VANCORANDOM, GENTTROUGH, GENTPEAK, GENTRANDOM, TOBRATROUGH, TOBRAPEAK, TOBRARND, AMIKACINPEAK, AMIKACINTROU, AMIKACIN in the last 72 hours.   Microbiology: No results found for this or any previous visit (from the past 720 hour(s)).  Medical History: Past Medical History  Diagnosis Date  . Abnormal Pap smear   . Fibroid   . IBS (irritable bowel syndrome)   . Hx of Salmonella gastroenteritis   . Hx of campylobacteriosis 2005  . Diverticulitis     Medications:  Prescriptions prior to admission  Medication Sig Dispense Refill Last Dose  . acidophilus (RISAQUAD) CAPS capsule Take 1 capsule by mouth every other day.   Past Week at Unknown time  . phentermine 37.5 MG capsule Take 37.5 mg by mouth every morning.   10/12/2015  . amoxicillin-clavulanate (AUGMENTIN) 875-125 MG tablet Take 1 tablet by mouth every 12 (twelve) hours. (Patient not taking: Reported on 11/08/2015) 20 tablet 0 Not Taking at Unknown time  . ciprofloxacin (CIPRO) 500 MG tablet Take 1 tablet (500 mg total) by mouth 2 (two) times daily. (Patient not taking: Reported on 11/08/2015) 28 tablet 0 Not Taking at Unknown time  . cyclobenzaprine (FLEXERIL) 5 MG tablet 1-2 PO  Q6H PRN BACK SPASM (Patient not taking: Reported on 10/15/2015) 30 tablet 0 Completed Course at Unknown time  . diazepam (VALIUM) 2 MG tablet Take 1 tablet (2 mg total) by mouth every 8 (eight) hours as needed for muscle spasms. (Patient not taking: Reported on 11/08/2015) 6 tablet 0 Not Taking at Unknown time  . HYDROcodone-acetaminophen (NORCO/VICODIN) 5-325 MG tablet Take 1 tablet by mouth every 6 (six) hours as needed for moderate pain. (Patient not taking: Reported on 11/08/2015) 20 tablet 0 Not Taking at Unknown time  . metroNIDAZOLE (FLAGYL) 500 MG tablet Take 1 tablet (500 mg total) by mouth 4 (four) times daily. One po bid x 7 days (Patient not taking: Reported on 11/08/2015) 56 tablet 0 Not Taking at Unknown time  . ondansetron (ZOFRAN ODT) 4 MG disintegrating tablet 4mg  ODT q4 hours prn nausea/vomit (Patient not taking: Reported on 10/15/2015) 12 tablet 0 Not Taking at Unknown time  . oxyCODONE-acetaminophen (PERCOCET/ROXICET) 5-325 MG tablet Take 2 tablets by mouth every 4 (four) hours as needed for severe pain. (Patient not taking: Reported on 11/08/2015) 6 tablet 0 Not Taking at Unknown time  . promethazine (PHENERGAN) 12.5 MG suppository 1-2 pr q4-6h prn nausea or vomiting (Patient not taking: Reported on 10/15/2015) 24 each 0 Completed Course at Unknown time   Assessment: 39 yo lady to start zosyn for intra-abdominal infection.  Goal of Therapy:  Eradication of infection  Plan:  Zosyn 3.375 gm IV q8 hours EI. F/u renal function, cultures and clinical course.  Thanks for allowing pharmacy to be a  part of this patient's care.  Excell Seltzer, PharmD Clinical Pharmacist 11/12/2015,12:18 PM

## 2015-11-12 NOTE — Telephone Encounter (Signed)
noted 

## 2015-11-12 NOTE — ED Provider Notes (Signed)
CSN: [id number]     Arrival date & time 11/12/15  [id number] History   First MD Initiated Contact with Patient 11/12/15 [phone]     Chief Complaint  Patient presents with  . Abdominal Pain     (Consider location/radiation/quality/duration/timing/severity/associated sxs/prior Treatment) HPI  Patient states she hasn't eaten since December 17 in preparation for having a colonoscopy done after she had been diagnosed with diverticulitis last month. She states last night at 4:30 PM she took her suprep and about 6:30 PM she vomited that. She states she spoke to her gastroenterologist and was told to take the [place]. She also vomited about an hour and a half after that. She also complains of diffuse abdominal pain that's worse in her left mid abdomen and states the area is higher than where she had the pain with her diverticulitis. She has had nausea and vomiting. She has used Phenergan suppositories twice. She states if she stands up she feels like she's going to pass out. She is unsure of fever. She states she finally couldn't stand it any more and is afraid she has a intestinal perforation.   PCP Dr Merlyn Albert GI Dr Oneida Alar  Past Medical History  Diagnosis Date  . Abnormal Pap smear   . Fibroid   . IBS (irritable bowel syndrome)   . Hx of Salmonella gastroenteritis   . Hx of campylobacteriosis 2005  . Diverticulitis    Past Surgical History  Procedure Laterality Date  . Cystectomy      abdomen  . Wisdom tooth extraction    . Colposcopy    . Gynecologic cryosurgery    . Vaginal delivery  04/07/13  . Cervical conization w/bx N/A 09/07/2013    Procedure: CONIZATION CERVIX WITH BIOPSY/REMOVAL OF IUD AND INSERTION;  Surgeon: Cyril Mourning, MD;  Location: [facility];  Service: Gynecology;  Laterality: N/A;  . Colonoscopy  2005 NUR    I/E HEMORRHOIDS, TIC AT HF, NL TI   Family History  Problem Relation Age of Onset  . Hypertension Mother   . Hypertension Father   . Melanoma Paternal Uncle   .  Prader-Willi syndrome Brother   . Diabetes Brother   . Hypertension Brother   . COPD Maternal Grandfather   . COPD Paternal Grandmother    Social History  Substance Use Topics  . Smoking status: Never Smoker   . Smokeless tobacco: None  . 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     Allergies  Ciprofloxacin and Adhesive  Home Medications   Prior to Admission medications   Medication Sig Start Date End Date Taking? Authorizing Provider  acidophilus (RISAQUAD) CAPS capsule Take 1 capsule by mouth every other day.    Historical Provider, MD  amoxicillin-clavulanate (AUGMENTIN) 875-125 MG tablet Take 1 tablet by mouth every 12 (twelve) hours. Patient not taking: Reported on 11/08/2015 10/15/15   Ezequiel Essex, MD  ciprofloxacin (CIPRO) 500 MG tablet Take 1 tablet (500 mg total) by mouth 2 (two) times daily. Patient not taking: Reported on 11/08/2015 10/12/15   Milton Ferguson, MD  cyclobenzaprine (FLEXERIL) 5 MG tablet 1-2 PO Q6H PRN BACK SPASM Patient not taking: Reported on 10/15/2015 12/21/14   Danie Binder, MD  diazepam (  VALIUM) 2 MG tablet Take 1 tablet (2 mg total) by mouth every 8 (eight) hours as needed for muscle spasms. Patient not taking: Reported on 11/08/2015 10/15/15   Ezequiel Essex, MD  HYDROcodone-acetaminophen (NORCO/VICODIN) 5-325 MG tablet Take 1 tablet by mouth every 6 (six) hours as needed for moderate pain. Patient not taking: Reported on 11/08/2015 10/12/15   Milton Ferguson, MD  metroNIDAZOLE (FLAGYL) 500 MG tablet Take 1 tablet (500 mg total) by mouth 4 (four) times daily. One po bid x 7 days Patient not taking: Reported on 11/08/2015 10/12/15   Milton Ferguson, MD  ondansetron (ZOFRAN ODT) 4 MG disintegrating tablet 4mg  ODT q4 hours prn nausea/vomit Patient not taking:  Reported on 10/15/2015 10/12/15   Milton Ferguson, MD  oxyCODONE-acetaminophen (PERCOCET/ROXICET) 5-325 MG tablet Take 2 tablets by mouth every 4 (four) hours as needed for severe pain. Patient not taking: Reported on 11/08/2015 10/13/15   Merryl Hacker, MD  phentermine 37.5 MG capsule Take 37.5 mg by mouth every morning.    Historical Provider, MD  promethazine (PHENERGAN) 12.5 MG suppository 1-2 pr q4-6h prn nausea or vomiting Patient not taking: Reported on 10/15/2015 05/27/15   Danie Binder, MD   BP 139/94 mmHg  Pulse 126  Resp 24  Ht 5\' 7"  (1.702 m)  Wt 245 lb (111.131 kg)  BMI 38.36 kg/m2  SpO2 100%  Vital signs normal except for tachycardia  Physical Exam  Constitutional: She is oriented to person, place, and time. She appears well-developed and well-nourished.  Non-toxic appearance. She does not appear ill. She appears distressed.  Moaning, holding her left abdomen  HENT:  Head: Normocephalic and atraumatic.  Right Ear: External ear normal.  Left Ear: External ear normal.  Nose: Nose normal. No mucosal edema or rhinorrhea.  Mouth/Throat: Oropharynx is clear and moist and mucous membranes are normal. No dental abscesses or uvula swelling.  Eyes: Conjunctivae and EOM are normal. Pupils are equal, round, and reactive to light.  Neck: Normal range of motion and full passive range of motion without pain. Neck supple.  Cardiovascular: Normal rate, regular rhythm and normal heart sounds.  Exam reveals no gallop and no friction rub.   No murmur heard. Pulmonary/Chest: Effort normal and breath sounds normal. No respiratory distress. She has no wheezes. She has no rhonchi. She has no rales. She exhibits no tenderness and no crepitus.  Abdominal: Soft. Normal appearance and bowel sounds are normal. She exhibits no distension. There is tenderness. There is no rebound and no guarding.    Tender mildly in the RUQ, and in the left mid abdomen  Musculoskeletal: Normal range of motion.  She exhibits no edema or tenderness.  Moves all extremities well.   Neurological: She is alert and oriented to person, place, and time. She has normal strength. No cranial nerve deficit.  Skin: Skin is warm, dry and intact. No rash noted. No erythema. There is pallor.  Psychiatric: She has a normal mood and affect. Her speech is normal and behavior is normal. Her mood appears not anxious.  Nursing note and vitals reviewed.   ED Course  Procedures (including critical care time)  Medications  sodium chloride 0.9 % bolus 1,000 mL (not administered)  sodium chloride 0.9 % bolus 1,000 mL (1,000 mLs Intravenous New Bag/Given 11/12/15 [id number])  sodium chloride 0.9 % bolus 1,000 mL (0 mLs Intravenous Stopped 11/12/15 0638)  fentaNYL (SUBLIMAZE) injection 50 mcg (50 mcg Intravenous Given 11/12/15 0503)  metoCLOPramide (REGLAN) injection 10 mg (10 mg Intravenous  Given 11/12/15 0501)  diphenhydrAMINE (BENADRYL) injection 25 mg (25 mg Intravenous Given 11/12/15 0459)  fentaNYL (SUBLIMAZE) injection 50 mcg (50 mcg Intravenous Given 11/12/15 0553)  iohexol (OMNIPAQUE) 300 MG/ML solution 100 mL (100 mLs Intravenous Contrast Given 11/12/15 0657)  morphine 4 MG/ML injection 4 mg (4 mg Intravenous Given 11/12/15 0731)  piperacillin-tazobactam (ZOSYN) IVPB 3.375 g (3.375 g Intravenous New Bag/Given 11/12/15 0731)  ondansetron (ZOFRAN) injection 4 mg (4 mg Intravenous Given 11/12/15 0738)   Patient was given IV fluids for her dehydration, IV pain medicine and IV nausea medication.  She was rechecked at 05 45, she again states she does not feel like she can drink oral contrast for the CT scan. CT scan was ordered with just IV contrast. She also was given more pain medication per her request.  Recheck at 7:25 AM patient was given the results of her CT scan. More pain medication was ordered. IV antibiotics were ordered for complicated diverticulitis, Zosyn. We discussed patient should be admitted. She is upset and  does not want to be admitted. However her husband agree she needs to stay in the hospital. I'm going to let her gastroenterologist know she is being admitted and have the hospitalist admit her to the hospital.  07:57 Dr Oneida Alar, made aware of her admission.  08:25 Dr Jerilee Hoh, admit to [facility] Results for orders placed or performed during the hospital encounter of 11/12/15  Comprehensive metabolic panel  Result Value Ref Range   Sodium 135 135 - 145 mmol/L   Potassium 3.9 3.5 - 5.1 mmol/L   Chloride 102 101 - 111 mmol/L   CO2 23 22 - 32 mmol/L   Glucose, Bld 141 (H) 65 - 99 mg/dL   BUN 8 6 - 20 mg/dL   Creatinine, Ser 0.81 0.44 - 1.00 mg/dL   Calcium 9.4 8.9 - 10.3 mg/dL   Total Protein 7.8 6.5 - 8.1 g/dL   Albumin 4.6 3.5 - 5.0 g/dL   AST 18 15 - 41 U/L   ALT 20 14 - 54 U/L   Alkaline Phosphatase 71 38 - 126 U/L   Total Bilirubin 0.9 0.3 - 1.2 mg/dL   GFR calc non Af Amer >60 >60 mL/min   GFR calc Af Amer >60 >60 mL/min   Anion gap 10 5 - 15  CBC with Differential  Result Value Ref Range   WBC 18.5 (H) 4.0 - 10.5 K/uL   RBC 5.37 (H) 3.87 - 5.11 MIL/uL   Hemoglobin 16.0 (H) 12.0 - 15.0 g/dL   HCT 45.8 36.0 - 46.0 %   MCV 85.3 78.0 - 100.0 fL   MCH 29.8 26.0 - 34.0 pg   MCHC 34.9 30.0 - 36.0 g/dL   RDW 12.8 11.5 - 15.5 %   Platelets 268 150 - 400 K/uL   Neutrophils Relative % 87 %   Neutro Abs 16.1 (H) 1.7 - 7.7 K/uL   Lymphocytes Relative 7 %   Lymphs Abs 1.3 0.7 - 4.0 K/uL   Monocytes Relative 6 %   Monocytes Absolute 1.2 (H) 0.1 - 1.0 K/uL   Eosinophils Relative 0 %   Eosinophils Absolute 0.0 0.0 - 0.7 K/uL   Basophils Relative 0 %   Basophils Absolute 0.0 0.0 - 0.1 K/uL  Lactic acid, plasma  Result Value Ref Range   Lactic Acid, Venous 1.1 0.5 - 2.0 mmol/L  Lipase, blood  Result Value Ref Range   Lipase 27 11 - 51 U/L  POC Urine  Pregnancy, ED (do NOT order at [facility])  Result Value Ref Range   Preg Test, Ur NEGATIVE NEGATIVE    Laboratory  interpretation all normal except concentrated hemoglobin consistent with dehydration, leukocytosis consistent with vomiting    Imaging Review Ct Abdomen Pelvis W Contrast  11/12/2015  CLINICAL DATA:  Acute left side abdominal pain and vomiting after bowel prep for diverticulitis follow-up. EXAM: CT ABDOMEN AND PELVIS WITH CONTRAST TECHNIQUE: Multidetector CT imaging of the abdomen and pelvis was performed using the standard protocol following bolus administration of intravenous contrast. CONTRAST:  100 mL OMNIPAQUE IOHEXOL 300 MG/ML  SOLN COMPARISON:  CT abdomen and pelvis 10/12/2015 and 08/30/2008. FINDINGS: Mild dependent atelectasis is seen in the lung bases. No pleural or pericardial effusion. There is wall thickening and pericolonic stranding about the distal transverse colon with a small volume of extra luminal air about the anterior wall of the colon in this location most compatible with diverticulitis and contained perforation. No abscess is identified. Diverticulitis in the transverse colon seen on the prior examination has resolved. The colon is otherwise unremarkable. The stomach, small bowel and appendix appear normal. The gallbladder, liver, spleen, adrenal glands, pancreas and kidneys are unremarkable. IUD is in place in the uterus. Pedunculated, calcified uterine fibroid is again seen. Adnexa and urinary bladder appear normal. There is no lymphadenopathy or fluid. No focal bony abnormality is identified. IMPRESSION: Findings most consistent with diverticulitis of the distal transverse colon with associated small contained perforation. No abscess. Diverticulitis of the mid transverse colon seen on the comparison CT scan has resolved. Electronically Signed   By: Inge Rise M.D.   On: 11/12/2015 07:17   I have personally reviewed and evaluated these images and lab results as part of my medical decision-making.   EKG Interpretation None      MDM   Final diagnoses:  Acute  diverticulitis  Perforation, intestine (HCC)  Nausea and vomiting, vomiting of unspecified type  Dehydration   Plan admission  Rolland Porter, MD, FACEP  CRITICAL CARE Performed by: Rolland Porter L Total critical care time: 36  minutes Critical care time was exclusive of separately billable procedures and treating other patients. Critical care was necessary to treat or prevent imminent or life-threatening deterioration. Critical care was time spent personally by me on the following activities: development of treatment plan with patient and/or surrogate as well as nursing, discussions with consultants, evaluation of patient's response to treatment, examination of patient, obtaining history from patient or surrogate, ordering and performing treatments and interventions, ordering and review of laboratory studies, ordering and review of radiographic studies, pulse oximetry and re-evaluation of patient's condition.       Rolland Porter, MD 11/12/15 [phone]

## 2015-11-13 DIAGNOSIS — K5792 Diverticulitis of intestine, part unspecified, without perforation or abscess without bleeding: Secondary | ICD-10-CM

## 2015-11-13 DIAGNOSIS — K631 Perforation of intestine (nontraumatic): Secondary | ICD-10-CM

## 2015-11-13 LAB — BASIC METABOLIC PANEL
ANION GAP: 9 (ref 5–15)
BUN: 6 mg/dL (ref 6–20)
CHLORIDE: 107 mmol/L (ref 101–111)
CO2: 22 mmol/L (ref 22–32)
Calcium: 8.2 mg/dL — ABNORMAL LOW (ref 8.9–10.3)
Creatinine, Ser: 0.93 mg/dL (ref 0.44–1.00)
Glucose, Bld: 87 mg/dL (ref 65–99)
POTASSIUM: 3.4 mmol/L — AB (ref 3.5–5.1)
SODIUM: 138 mmol/L (ref 135–145)

## 2015-11-13 LAB — CBC
HEMATOCRIT: 38.2 % (ref 36.0–46.0)
HEMOGLOBIN: 12.9 g/dL (ref 12.0–15.0)
MCH: 29.5 pg (ref 26.0–34.0)
MCHC: 33.8 g/dL (ref 30.0–36.0)
MCV: 87.4 fL (ref 78.0–100.0)
Platelets: 204 10*3/uL (ref 150–400)
RBC: 4.37 MIL/uL (ref 3.87–5.11)
RDW: 13.2 % (ref 11.5–15.5)
WBC: 12.9 10*3/uL — AB (ref 4.0–10.5)

## 2015-11-13 NOTE — Plan of Care (Signed)
PT SEEN. PAIN IMPROVED. NO NAUSEA OR VOMITING. ADVANCE TO CLEAR LIQUIDS.PT INSTRUCTED IF PAIN GETS WORSE SHE SHOULD RETURN TO NPO STATUS. IF PAIN REMAINS THE SAME OR GETS BETTER OVER NEXT 24 HRS WILL ADVANCE TO FULL LIQUIDS. ANTICIPATE D/C FRI DEC 23.

## 2015-11-13 NOTE — Care Management Note (Signed)
Case Management Note  Patient Details  Name: Cynthia Ochoa MRN: [id number] Date of Birth: [birth year]  Subjective/Objective:                  Pt admitted from home with diverticulitis with perforation. Pt lives with her husband and will return home at discharge. Pt is independent with ADL's.   Action/Plan: No Cm needs noted.  Expected Discharge Date:  11/15/15               Expected Discharge Plan:  Home/Self Care  In-House Referral:  NA  Discharge planning Services  CM Consult  Post Acute Care Choice:  NA Choice offered to:  NA  DME Arranged:    DME Agency:     HH Arranged:    HH Agency:     Status of Service:  Completed, signed off  Medicare Important Message Given:    Date Medicare IM Given:    Medicare IM give by:    Date Additional Medicare IM Given:    Additional Medicare Important Message give by:     If discussed at [place] of Stay Meetings, dates discussed:    Additional Comments:  Joylene Draft, RN 11/13/2015, 10:40 AM

## 2015-11-13 NOTE — Progress Notes (Signed)
[facility] PROGRESS NOTE  Cynthia Ochoa [id number] DOB: December 28, 1975    PCP:   Wende Neighbors, MD   HPI: Cynthia Ochoa is an 39 y.o. 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  GI has been her and advanced her diet today.  Pain control and antiemetics are adequate.  She is on IV Zosyn,  tolerating it well.  Rewiew of Systems:  Constitutional: Negative for malaise, fever and chills. No significant weight loss or weight gain Eyes: Negative for eye pain, redness and discharge, diplopia, visual changes, or flashes of light. ENMT: Negative for ear pain, hoarseness, nasal congestion, sinus pressure and sore throat. No headaches; tinnitus, drooling, or problem swallowing. Cardiovascular: Negative for chest pain, palpitations, diaphoresis, dyspnea and peripheral edema. ; No orthopnea, PND Respiratory: Negative for cough, hemoptysis, wheezing and stridor. No pleuritic chestpain. Gastrointestinal: Negative for nausea, vomiting, diarrhea, constipation, melena, blood in stool, hematemesis, jaundice and rectal bleeding.    Genitourinary: Negative for frequency, dysuria, incontinence,flank pain and hematuria; Musculoskeletal: Negative for back pain and neck pain. Negative for swelling and trauma.;  Skin: . Negative for pruritus, rash, abrasions, bruising and skin lesion.; ulcerations Neuro: Negative for headache, lightheadedness and neck stiffness. Negative for weakness, altered level of consciousness , altered mental status, extremity weakness, burning feet, involuntary movement, seizure and syncope.  Psych: negative for anxiety, depression, insomnia, tearfulness, panic attacks, hallucinations, paranoia, suicidal or homicidal ideation    Past Medical History  Diagnosis Date  . Abnormal Pap smear   . Fibroid   . IBS (irritable bowel syndrome)   . Hx of Salmonella gastroenteritis   . Hx of campylobacteriosis 2005   . Diverticulitis     Past Surgical History  Procedure Laterality Date  . Cystectomy      abdomen  . Wisdom tooth extraction    . Colposcopy    . Gynecologic cryosurgery    . Vaginal delivery  04/07/13  . Cervical conization w/bx N/A 09/07/2013    Procedure: CONIZATION CERVIX WITH BIOPSY/REMOVAL OF IUD AND INSERTION;  Surgeon: Cyril Mourning, MD;  Location: [facility];  Service: Gynecology;  Laterality: N/A;  . Colonoscopy  2005 NUR    I/E HEMORRHOIDS, TIC AT HF, NL TI    Medications:  HOME MEDS: Prior to Admission medications   Medication Sig Start Date End Date Taking? Authorizing Provider  acidophilus (RISAQUAD) CAPS capsule Take 1 capsule by mouth every other day.   Yes Historical Provider, MD  phentermine 37.5 MG capsule Take 37.5 mg by mouth every morning.   Yes Historical Provider, MD  amoxicillin-clavulanate (AUGMENTIN) 875-125 MG tablet Take 1 tablet by mouth every 12 (twelve) hours. Patient not taking: Reported on 11/08/2015 10/15/15   Ezequiel Essex, MD  ciprofloxacin (CIPRO) 500 MG tablet Take 1 tablet (500 mg total) by mouth 2 (two) times daily. Patient not taking: Reported on 11/08/2015 10/12/15   Milton Ferguson, MD  cyclobenzaprine (FLEXERIL) 5 MG tablet 1-2 PO Q6H PRN BACK SPASM Patient not taking: Reported on 10/15/2015 12/21/14   Danie Binder, MD  diazepam (VALIUM) 2 MG tablet Take 1 tablet (2 mg total) by mouth every 8 (eight) hours as needed for muscle spasms. Patient not taking: Reported on 11/08/2015 10/15/15   Ezequiel Essex, MD  HYDROcodone-acetaminophen (NORCO/VICODIN) 5-325 MG tablet Take 1 tablet by mouth every 6 (six) hours as needed for moderate pain. Patient not taking: Reported on 11/08/2015 10/12/15   Milton Ferguson, MD  metroNIDAZOLE (  FLAGYL) 500 MG tablet Take 1 tablet (500 mg total) by mouth 4 (four) times daily. One po bid x 7 days Patient not taking: Reported on 11/08/2015 10/12/15   Milton Ferguson, MD  ondansetron (ZOFRAN ODT) 4 MG  disintegrating tablet 4mg  ODT q4 hours prn nausea/vomit Patient not taking: Reported on 10/15/2015 10/12/15   Milton Ferguson, MD  oxyCODONE-acetaminophen (PERCOCET/ROXICET) 5-325 MG tablet Take 2 tablets by mouth every 4 (four) hours as needed for severe pain. Patient not taking: Reported on 11/08/2015 10/13/15   Merryl Hacker, MD  promethazine (PHENERGAN) 12.5 MG suppository 1-2 pr q4-6h prn nausea or vomiting Patient not taking: Reported on 10/15/2015 05/27/15   Danie Binder, MD     Allergies:  Allergies  Allergen Reactions  . Ciprofloxacin Other (See Comments)    Nerve pain.  Marland Kitchen Morphine And Related Other (See Comments)    CHEST PRESSURE  . Adhesive [Tape] Rash    Social History:   reports that she has never smoked. She does not have any smokeless tobacco history on file. She reports that she does not drink alcohol or use illicit drugs.  Family History: Family History  Problem Relation Age of Onset  . Hypertension Mother   . Hypertension Father   . Melanoma Paternal Uncle   . Prader-Willi syndrome Brother   . Diabetes Brother   . Hypertension Brother   . COPD Maternal Grandfather   . COPD Paternal Grandmother      Physical Exam: Filed Vitals:   11/12/15 0927 11/12/15 2238 11/13/15 0526 11/13/15 0801  BP: 136/70 123/65 125/75   Pulse: 107 106 108 106  Temp: 98.4 F (36.9 C) 98.6 F (37 C) 99.8 F (37.7 C)   TempSrc: Oral Oral Oral   Resp: 20 20 20 18   Height: 5\' 7"  (1.702 m)     Weight:      SpO2: 98% 97% 95% 95%   Blood pressure 125/75, pulse 106, temperature 99.8 F (37.7 C), temperature source Oral, resp. rate 18, height 5\' 7"  (1.702 m), weight 111.131 kg (245 lb), SpO2 95 %.  GEN:  Pleasant  patient lying in the stretcher in no acute distress; cooperative with exam. PSYCH:  alert and oriented x4; does not appear anxious or depressed; affect is appropriate. HEENT: Mucous membranes pink and anicteric; PERRLA; EOM intact; no cervical lymphadenopathy nor  thyromegaly or carotid bruit; no JVD; There were no stridor. Neck is very supple. Breasts:: Not examined CHEST WALL: No tenderness CHEST: Normal respiration, clear to auscultation bilaterally.  HEART: Regular rate and rhythm.  There are no murmur, rub, or gallops.   BACK: No kyphosis or scoliosis; no CVA tenderness ABDOMEN: soft and still tender LUQ.  no masses, no organomegaly, normal abdominal bowel sounds; no pannus; no intertriginous candida. There is no rebound and no distention. Rectal Exam: Not done EXTREMITIES: No bone or joint deformity; age-appropriate arthropathy of the hands and knees; no edema; no ulcerations.  There is no calf tenderness. Genitalia: not examined PULSES: 2+ and symmetric SKIN: Normal hydration no rash or ulceration CNS: Cranial nerves 2-12 grossly intact no focal lateralizing neurologic deficit.  Speech is fluent; uvula elevated with phonation, facial symmetry and tongue midline. DTR are normal bilaterally, cerebella exam is intact, barbinski is negative and strengths are equaled bilaterally.  No sensory loss.   Labs on Admission:  Basic Metabolic Panel:  Recent Labs Lab 11/12/15 0450 11/13/15 0441  NA 135 138  K 3.9 3.4*  CL 102 107  CO2 23  22  GLUCOSE 141* 87  BUN 8 6  CREATININE 0.81 0.93  CALCIUM 9.4 8.2*   Liver Function Tests:  Recent Labs Lab 11/12/15 0450  AST 18  ALT 20  ALKPHOS 71  BILITOT 0.9  PROT 7.8  ALBUMIN 4.6    Recent Labs Lab 11/12/15 0450  LIPASE 27   CBC:  Recent Labs Lab 11/12/15 0450 11/13/15 0441  WBC 18.5* 12.9*  NEUTROABS 16.1*  --   HGB 16.0* 12.9  HCT 45.8 38.2  MCV 85.3 87.4  PLT 268 204   Cardiac Enzymes: No results for input(s): CKTOTAL, CKMB, CKMBINDEX, TROPONINI in the last 168 hours.  CBG: No results for input(s): GLUCAP in the last 168 hours.   Radiological Exams on Admission: Ct Abdomen Pelvis W Contrast  11/12/2015  CLINICAL DATA:  Acute left side abdominal pain and vomiting after  bowel prep for diverticulitis follow-up. EXAM: CT ABDOMEN AND PELVIS WITH CONTRAST TECHNIQUE: Multidetector CT imaging of the abdomen and pelvis was performed using the standard protocol following bolus administration of intravenous contrast. CONTRAST:  100 mL OMNIPAQUE IOHEXOL 300 MG/ML  SOLN COMPARISON:  CT abdomen and pelvis 10/12/2015 and 08/30/2008. FINDINGS: Mild dependent atelectasis is seen in the lung bases. No pleural or pericardial effusion. There is wall thickening and pericolonic stranding about the distal transverse colon with a small volume of extra luminal air about the anterior wall of the colon in this location most compatible with diverticulitis and contained perforation. No abscess is identified. Diverticulitis in the transverse colon seen on the prior examination has resolved. The colon is otherwise unremarkable. The stomach, small bowel and appendix appear normal. The gallbladder, liver, spleen, adrenal glands, pancreas and kidneys are unremarkable. IUD is in place in the uterus. Pedunculated, calcified uterine fibroid is again seen. Adnexa and urinary bladder appear normal. There is no lymphadenopathy or fluid. No focal bony abnormality is identified. IMPRESSION: Findings most consistent with diverticulitis of the distal transverse colon with associated small contained perforation. No abscess. Diverticulitis of the mid transverse colon seen on the comparison CT scan has resolved. Electronically Signed   By: Inge Rise M.D.   On: 11/12/2015 07:17   Assessment/Plan Present on Admission:  . Perforated intestine ([place]) . Acute diverticulitis of intestine  PLAN: Will continue with IV Zosyn, IV antiemetics, and IV analgesics.  Diet is being advanced by GI, as tolerated.  She likely will benefit outpatient surgery with hemicolectomy given recurrence.  She is stable, full code, and we will continue with current therapy.  Thanks.   Other plans as per orders.  Code Status: FULL Haskel Khan, MD.  FACP [facility] Pager [phone] 7pm to 7am.  11/13/2015, 1:40 PM

## 2015-11-14 ENCOUNTER — Telehealth: Payer: Self-pay | Admitting: Gastroenterology

## 2015-11-14 LAB — C DIFFICILE QUICK SCREEN W PCR REFLEX
C Diff antigen: POSITIVE — AB
C Diff toxin: NEGATIVE

## 2015-11-14 MED ORDER — AMOXICILLIN-POT CLAVULANATE 875-125 MG PO TABS: 1.0000 | ORAL_TABLET | Freq: Two times a day (BID) | ORAL | Status: DC

## 2015-11-14 MED ORDER — OXYCODONE-ACETAMINOPHEN 5-325 MG PO TABS: 1.0000 | ORAL_TABLET | Freq: Four times a day (QID) | ORAL | Status: DC | PRN

## 2015-11-14 MED ORDER — METRONIDAZOLE 500 MG PO TABS: ORAL_TABLET | ORAL | Status: DC

## 2015-11-14 MED ORDER — METRONIDAZOLE 500 MG PO TABS
500.0000 mg | ORAL_TABLET | Freq: Three times a day (TID) | ORAL | Status: DC
  Administered 2015-11-14: 500 mg via ORAL
  Filled 2015-11-14: qty 1

## 2015-11-14 MED ORDER — SACCHAROMYCES BOULARDII 250 MG PO CAPS: 250.0000 mg | ORAL_CAPSULE | Freq: Two times a day (BID) | ORAL | Status: DC

## 2015-11-14 MED ORDER — OXYCODONE-ACETAMINOPHEN 5-325 MG PO TABS
1.0000 | ORAL_TABLET | ORAL | Status: DC | PRN
  Administered 2015-11-14: 1 via ORAL
  Filled 2015-11-14: qty 1

## 2015-11-14 MED ORDER — OXYCODONE-ACETAMINOPHEN 7.5-325 MG PO TABS: 1.0000 | ORAL_TABLET | ORAL | Status: DC | PRN

## 2015-11-14 MED ORDER — LACTINEX PO CHEW
1.0000 | CHEWABLE_TABLET | Freq: Three times a day (TID) | ORAL | Status: DC
  Administered 2015-11-14: 1 via ORAL
  Filled 2015-11-14 (×4): qty 1

## 2015-11-14 NOTE — Progress Notes (Signed)
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 GI has been her and advanced her diet today. Pain control and antiemetics are adequate. She is on IV Zosyn, tolerating it well.  She consulted Dr Arnoldo Morale herself, and he saw her and advanced her diet to full liquid today. Her pain has some improvement.    Rewiew of Systems:  Constitutional: Negative for malaise, fever and chills. No significant weight loss or weight gain Eyes: Negative for eye pain, redness and discharge, diplopia, visual changes, or flashes of light. ENMT: Negative for ear pain, hoarseness, nasal congestion, sinus pressure and sore throat. No headaches; tinnitus, drooling, or problem swallowing. Cardiovascular: Negative for chest pain, palpitations, diaphoresis, dyspnea and peripheral edema. ; No orthopnea, PND Respiratory: Negative for cough, hemoptysis, wheezing and stridor. No pleuritic chestpain. Gastrointestinal: Negative for nausea, vomiting, diarrhea, constipation, abdominal pain, melena, blood in stool, hematemesis, jaundice and rectal bleeding.    Genitourinary: Negative for frequency, dysuria, incontinence,flank pain and hematuria; Musculoskeletal: Negative for back pain and neck pain. Negative for swelling and trauma.;  Skin: . Negative for pruritus, rash, abrasions, bruising and skin lesion.; ulcerations Neuro: Negative for headache, lightheadedness and neck stiffness. Negative for weakness, altered level of consciousness , altered mental status, extremity weakness, burning feet, involuntary movement, seizure and syncope.  Psych: negative for anxiety, depression, insomnia, tearfulness, panic attacks, hallucinations, paranoia, suicidal or homicidal ideation    Past Medical History  Diagnosis Date   . Abnormal Pap smear   . Fibroid   . IBS (irritable bowel syndrome)   . Hx of Salmonella gastroenteritis   . Hx of campylobacteriosis 2005  . Diverticulitis     Past Surgical History  Procedure Laterality Date  . Cystectomy      abdomen  . Wisdom tooth extraction    . Colposcopy    . Gynecologic cryosurgery    . Vaginal delivery  04/07/13  . Cervical conization w/bx N/A 09/07/2013    Procedure: CONIZATION CERVIX WITH BIOPSY/REMOVAL OF IUD AND INSERTION;  Surgeon: Cyril Mourning, MD;  Location: [facility];  Service: Gynecology;  Laterality: N/A;  . Colonoscopy  2005 NUR    I/E HEMORRHOIDS, TIC AT HF, NL TI    Medications:  HOME MEDS: Prior to Admission medications   Medication Sig Start Date End Date Taking? Authorizing Provider  acidophilus (RISAQUAD) CAPS capsule Take 1 capsule by mouth every other day.   Yes Historical Provider, MD  phentermine 37.5 MG capsule Take 37.5 mg by mouth every morning.   Yes Historical Provider, MD  amoxicillin-clavulanate (AUGMENTIN) 875-125 MG tablet Take 1 tablet by mouth every 12 (twelve) hours. Patient not taking: Reported on 11/08/2015 10/15/15   Cynthia Essex, MD  ciprofloxacin (CIPRO) 500 MG tablet Take 1 tablet (500 mg total) by mouth 2 (two) times daily. Patient not taking: Reported on 11/08/2015 10/12/15   Milton Ferguson, MD  cyclobenzaprine (FLEXERIL) 5 MG tablet 1-2 PO Q6H PRN BACK SPASM Patient not taking: Reported on 10/15/2015 12/21/14   Danie Binder, MD  diazepam (VALIUM) 2 MG tablet Take 1 tablet (2 mg total) by mouth every 8 (eight) hours as needed for muscle spasms. Patient not taking: Reported on 11/08/2015 10/15/15   Cynthia Essex, MD  HYDROcodone-acetaminophen (NORCO/VICODIN) 5-325 MG tablet Take 1 tablet by  mouth every 6 (six) hours as needed for moderate pain. Patient not taking: Reported on 11/08/2015 10/12/15   Milton Ferguson, MD  metroNIDAZOLE (FLAGYL) 500 MG tablet Take 1 tablet (500 mg total) by mouth 4 (four) times  daily. One po bid x 7 days Patient not taking: Reported on 11/08/2015 10/12/15   Milton Ferguson, MD  ondansetron (ZOFRAN ODT) 4 MG disintegrating tablet 4mg  ODT q4 hours prn nausea/vomit Patient not taking: Reported on 10/15/2015 10/12/15   Milton Ferguson, MD  oxyCODONE-acetaminophen (PERCOCET/ROXICET) 5-325 MG tablet Take 2 tablets by mouth every 4 (four) hours as needed for severe pain. Patient not taking: Reported on 11/08/2015 10/13/15   Merryl Hacker, MD  promethazine (PHENERGAN) 12.5 MG suppository 1-2 pr q4-6h prn nausea or vomiting Patient not taking: Reported on 10/15/2015 05/27/15   Danie Binder, MD     Allergies:  Allergies  Allergen Reactions  . Ciprofloxacin Other (See Comments)    Nerve pain.  Marland Kitchen Morphine And Related Other (See Comments)    CHEST PRESSURE  . Adhesive [Tape] Rash    Social History:   reports that she has never smoked. She does not have any smokeless tobacco history on file. She reports that she does not drink alcohol or use illicit drugs.  Family History: Family History  Problem Relation Age of Onset  . Hypertension Mother   . Hypertension Father   . Melanoma Paternal Uncle   . Prader-Willi syndrome Brother   . Diabetes Brother   . Hypertension Brother   . COPD Maternal Grandfather   . COPD Paternal Grandmother      Physical Exam: Filed Vitals:   11/13/15 1342 11/13/15 2132 11/14/15 0519 11/14/15 0803  BP: 119/64 124/79 123/78   Pulse: 106 92 87   Temp: 99 F (37.2 C) 99.2 F (37.3 C) 98.3 F (36.8 C)   TempSrc: Oral Oral Oral   Resp: 20 20 20    Height:      Weight:      SpO2: 98% 97% 96% 97%   Blood pressure 123/78, pulse 87, temperature 98.3 F (36.8 C), temperature source Oral, resp. rate 20, height 5\' 7"  (1.702 m), weight 111.131 kg (245 lb), SpO2 97 %.  GEN:  Pleasant patient lying in the stretcher in no acute distress; cooperative with exam. PSYCH:  alert and oriented x4; does not appear anxious or depressed; affect is  appropriate. HEENT: Mucous membranes pink and anicteric; PERRLA; EOM intact; no cervical lymphadenopathy nor thyromegaly or carotid bruit; no JVD; There were no stridor. Neck is very supple. Breasts:: Not examined CHEST WALL: No tenderness CHEST: Normal respiration, clear to auscultation bilaterally.  HEART: Regular rate and rhythm.  There are no murmur, rub, or gallops.   BACK: No kyphosis or scoliosis; no CVA tenderness ABDOMEN: soft and non-tender; no masses, no organomegaly, normal abdominal bowel sounds; no pannus; no intertriginous candida. There is no rebound and no distention. Rectal Exam: Not done EXTREMITIES: No bone or joint deformity; age-appropriate arthropathy of the hands and knees; no edema; no ulcerations.  There is no calf tenderness. Genitalia: not examined PULSES: 2+ and symmetric SKIN: Normal hydration no rash or ulceration CNS: Cranial nerves 2-12 grossly intact no focal lateralizing neurologic deficit.  Speech is fluent; uvula elevated with phonation, facial symmetry and tongue midline. DTR are normal bilaterally, cerebella exam is intact, barbinski is negative and strengths are equaled bilaterally.  No sensory loss.   Labs on Admission:  Basic Metabolic Panel:  Recent Labs Lab 11/12/15 0450  11/13/15 0441  NA 135 138  K 3.9 3.4*  CL 102 107  CO2 23 22  GLUCOSE 141* 87  BUN 8 6  CREATININE 0.81 0.93  CALCIUM 9.4 8.2*   Liver Function Tests:  Recent Labs Lab 11/12/15 0450  AST 18  ALT 20  ALKPHOS 71  BILITOT 0.9  PROT 7.8  ALBUMIN 4.6    Recent Labs Lab 11/12/15 0450  LIPASE 27   CBC:  Recent Labs Lab 11/12/15 0450 11/13/15 0441  WBC 18.5* 12.9*  NEUTROABS 16.1*  --   HGB 16.0* 12.9  HCT 45.8 38.2  MCV 85.3 87.4  PLT 268 204   Assessment/Plan Present on Admission:  . Perforated intestine ([place]) . Acute diverticulitis of intestine  PLAN:  Contained perforated diverticulitis:  Continue with IV Zosyn.  Plan for dicharge tomorrow if  she can tolerate diet. Dr Wynonia Sours advanced her diet today.   She will likely follow up with him outpatient for hemicolectomy.  D/C IVF today.   Other plans as per orders.  Code Status: FULL Haskel Khan, MD.  FACP [facility] Pager [phone] 7pm to 7am.  11/14/2015, 9:06 AM

## 2015-11-14 NOTE — Progress Notes (Signed)
Pt discharged to home with family 

## 2015-11-14 NOTE — Telephone Encounter (Signed)
OV made °

## 2015-11-14 NOTE — Progress Notes (Signed)
Pt tolerating full liquids. Advance to low fat/dairy free diet. Ok to d/c home on lactase pills if consuming dairy. NEED AUGMENTIN 875 MG BID FOR 10 DAYS. FLAGYL 500 MG TID FOR 14 DAYS. FLORASTOR  BID FOR ONE MONTH.

## 2015-11-14 NOTE — Telephone Encounter (Signed)
PT SEEN AND EXAMINED. D/C HOME ON AUGMENTIN/FLAGYL PERCOCET PRN. OPV E15 IN 3 WEEKS W/ SLF.

## 2015-11-14 NOTE — Consult Note (Signed)
Patient requested me to see her.  She is well-known to me. Admitted with her second episode of diverticulitis. This time, she had a small perforation but no abscess. Her left upper quadrant abdominal pain has eased during her admission. White blood cell count has started to normalize. She is having multiple bowel movements. Her abdomen is soft with some tenderness to deep palpation in left upper quadrant. No rigidity is noted. I did review her CT scans. I advanced her to a full liquid diet. May advance her diet as tolerated. Percocet has been ordered as she wants off IV pain medication.

## 2015-11-14 NOTE — Discharge Instructions (Signed)
COMPLETE AUGMENTIN FOR 10 DAYS. COMPLETE FLAGYL FOR 14 DAYS. TAKE FLORASTOR FOR ONE MONTH. FOLLOW UP JAN 2017 WITH DR. Aliegha Paullin.

## 2015-11-21 ENCOUNTER — Telehealth: Payer: Self-pay | Admitting: Gastroenterology

## 2015-11-21 ENCOUNTER — Other Ambulatory Visit: Payer: Self-pay

## 2015-11-21 DIAGNOSIS — K5732 Diverticulitis of large intestine without perforation or abscess without bleeding: Secondary | ICD-10-CM

## 2015-11-21 NOTE — Telephone Encounter (Signed)
Referral made to [facility] with Dr. Dalbert Batman.  Pt has appt on 12/17/2015 @ 0915

## 2015-11-21 NOTE — Telephone Encounter (Signed)
PT HAD ONE EPISODE OF MID-TRANSVERSE DIVERTICULITIS NOV 2016 AND THEN SECOND EPISODE OF SPLENIC FLEXURE DIVERTICULITIS COMPLICATED BY PERFORATION IN DEC 2016. REFER TO DR. Fanny Skates TO DISCUSS COLECTOMY.

## 2015-11-21 NOTE — Telephone Encounter (Signed)
Pt aware of appt.

## 2015-11-28 NOTE — Telephone Encounter (Signed)
PT NEEDS TCS FRI FEB 3. SHE NEEDS THE MIRALAX PREP ON FEB 2. TAKE MIRALAX 17 GMS PO Q1H FROM 12N TO 6PM. DRINK 1 CUP OF LIQUID 30 MINUTES AFTER EACH DOSE: 1230P TO 630PM. TAKE DULCOLAX 10 MG PO AT 230P AND 430P. CLEAR LIQUID DIET ON FEB 2.

## 2015-11-29 ENCOUNTER — Other Ambulatory Visit: Payer: Self-pay

## 2015-11-29 DIAGNOSIS — K5732 Diverticulitis of large intestine without perforation or abscess without bleeding: Secondary | ICD-10-CM

## 2015-11-29 NOTE — Telephone Encounter (Signed)
Pt is aware of time and her instructions are in the mail

## 2015-12-17 DIAGNOSIS — Z6839 Body mass index (BMI) 39.0-39.9, adult: Secondary | ICD-10-CM | POA: Diagnosis not present

## 2015-12-17 DIAGNOSIS — K5732 Diverticulitis of large intestine without perforation or abscess without bleeding: Secondary | ICD-10-CM | POA: Diagnosis not present

## 2015-12-17 DIAGNOSIS — Z8619 Personal history of other infectious and parasitic diseases: Secondary | ICD-10-CM | POA: Diagnosis not present

## 2015-12-19 ENCOUNTER — Ambulatory Visit: Payer: 59 | Admitting: Gastroenterology

## 2015-12-27 ENCOUNTER — Ambulatory Visit (HOSPITAL_COMMUNITY)
Admission: RE | Admit: 2015-12-27 | Discharge: 2015-12-27 | Disposition: A | Discharge status: Home / Self Care | Payer: 59 | Source: Ambulatory Visit | Attending: Gastroenterology | Admitting: Gastroenterology

## 2015-12-27 ENCOUNTER — Encounter (HOSPITAL_COMMUNITY)
Admission: RE | Disposition: A | Discharge status: Home / Self Care | Payer: Self-pay | Source: Ambulatory Visit | Attending: Gastroenterology

## 2015-12-27 ENCOUNTER — Encounter (HOSPITAL_COMMUNITY): Payer: Self-pay

## 2015-12-27 DIAGNOSIS — K5730 Diverticulosis of large intestine without perforation or abscess without bleeding: Secondary | ICD-10-CM | POA: Diagnosis not present

## 2015-12-27 DIAGNOSIS — K648 Other hemorrhoids: Secondary | ICD-10-CM | POA: Insufficient documentation

## 2015-12-27 DIAGNOSIS — R933 Abnormal findings on diagnostic imaging of other parts of digestive tract: Secondary | ICD-10-CM | POA: Diagnosis not present

## 2015-12-27 DIAGNOSIS — Q438 Other specified congenital malformations of intestine: Secondary | ICD-10-CM | POA: Diagnosis not present

## 2015-12-27 DIAGNOSIS — K5732 Diverticulitis of large intestine without perforation or abscess without bleeding: Secondary | ICD-10-CM | POA: Diagnosis not present

## 2015-12-27 HISTORY — PX: COLONOSCOPY: SHX5424

## 2015-12-27 SURGERY — COLONOSCOPY
Anesthesia: Moderate Sedation

## 2015-12-27 MED ORDER — PROMETHAZINE HCL 25 MG/ML IJ SOLN
INTRAMUSCULAR | Status: AC
  Filled 2015-12-27: qty 1

## 2015-12-27 MED ORDER — PROMETHAZINE HCL 25 MG/ML IJ SOLN
25.0000 mg | Freq: Once | INTRAMUSCULAR | Status: AC
  Administered 2015-12-27: 25 mg via INTRAVENOUS

## 2015-12-27 MED ORDER — SODIUM CHLORIDE 0.9 % IV SOLN
INTRAVENOUS | Status: DC
  Administered 2015-12-27: 08:00:00 via INTRAVENOUS

## 2015-12-27 MED ORDER — STERILE WATER FOR IRRIGATION IR SOLN
Status: DC | PRN
  Administered 2015-12-27: 2.5 mL

## 2015-12-27 MED ORDER — MIDAZOLAM HCL 5 MG/5ML IJ SOLN
INTRAMUSCULAR | Status: DC | PRN
  Administered 2015-12-27: 2 mg via INTRAVENOUS
  Administered 2015-12-27: 1 mg via INTRAVENOUS
  Administered 2015-12-27: 2 mg via INTRAVENOUS

## 2015-12-27 MED ORDER — SPOT INK MARKER SYRINGE KIT
PACK | SUBMUCOSAL | Status: DC | PRN
  Administered 2015-12-27: 3 mL via SUBMUCOSAL

## 2015-12-27 MED ORDER — SODIUM CHLORIDE 0.9% FLUSH
INTRAVENOUS | Status: AC
  Filled 2015-12-27: qty 10

## 2015-12-27 MED ORDER — MIDAZOLAM HCL 5 MG/5ML IJ SOLN
INTRAMUSCULAR | Status: AC
  Filled 2015-12-27: qty 10

## 2015-12-27 MED ORDER — MEPERIDINE HCL 100 MG/ML IJ SOLN
INTRAMUSCULAR | Status: DC | PRN
  Administered 2015-12-27: 25 mg via INTRAVENOUS
  Administered 2015-12-27: 50 mg

## 2015-12-27 MED ORDER — MEPERIDINE HCL 100 MG/ML IJ SOLN
INTRAMUSCULAR | Status: AC
  Filled 2015-12-27: qty 2

## 2015-12-27 NOTE — H&P (Signed)
Primary Care Physician:  Wende Neighbors, MD Primary Gastroenterologist:  Dr. Oneida Alar  Pre-Procedure History & Physical: HPI:  Cynthia Ochoa is a 40 y.o. female here for abnormal CT.  Past Medical History  Diagnosis Date  . Abnormal Pap smear   . Fibroid   . IBS (irritable bowel syndrome)   . Hx of Salmonella gastroenteritis   . Hx of campylobacteriosis 2005  . Diverticulitis     Past Surgical History  Procedure Laterality Date  . Cystectomy      abdomen  . Wisdom tooth extraction    . Colposcopy    . Gynecologic cryosurgery    . Vaginal delivery  04/07/13  . Cervical conization w/bx N/A 09/07/2013    Procedure: CONIZATION CERVIX WITH BIOPSY/REMOVAL OF IUD AND INSERTION;  Surgeon: Cyril Mourning, MD;  Location: [facility];  Service: Gynecology;  Laterality: N/A;  . Colonoscopy  2005 NUR    I/E HEMORRHOIDS, TIC AT HF, NL TI    Prior to Admission medications   Medication Sig Start Date End Date Taking? Authorizing Provider  amoxicillin-clavulanate (AUGMENTIN) 875-125 MG tablet Take 1 tablet by mouth 2 (two) times daily. Patient not taking: Reported on 12/17/2015 11/14/15   Danie Binder, MD  cyclobenzaprine (FLEXERIL) 5 MG tablet 1-2 PO Q6H PRN BACK SPASM Patient not taking: Reported on 10/15/2015 12/21/14   Danie Binder, MD  diazepam (VALIUM) 2 MG tablet Take 1 tablet (2 mg total) by mouth every 8 (eight) hours as needed for muscle spasms. Patient not taking: Reported on 11/08/2015 10/15/15   Ezequiel Essex, MD  HYDROcodone-acetaminophen (NORCO/VICODIN) 5-325 MG tablet Take 1 tablet by mouth every 6 (six) hours as needed for moderate pain. Patient not taking: Reported on 11/08/2015 10/12/15   Milton Ferguson, MD  metroNIDAZOLE (FLAGYL) 500 MG tablet 1 PO  TID FOR 14 DAYS Patient not taking: Reported on 12/17/2015 11/14/15   Danie Binder, MD  ondansetron (ZOFRAN ODT) 4 MG disintegrating tablet 4mg  ODT q4 hours prn nausea/vomit Patient not taking: Reported on 10/15/2015 10/12/15    Milton Ferguson, MD  oxyCODONE-acetaminophen (PERCOCET/ROXICET) 5-325 MG tablet Take 1-2 tablets by mouth every 6 (six) hours as needed for severe pain. Patient not taking: Reported on 12/17/2015 11/14/15   Danie Binder, MD  phentermine 37.5 MG capsule Take 37.5 mg by mouth every morning.    Historical Provider, MD  promethazine (PHENERGAN) 12.5 MG suppository 1-2 pr q4-6h prn nausea or vomiting Patient not taking: Reported on 10/15/2015 05/27/15   Danie Binder, MD  saccharomyces boulardii (FLORASTOR) 250 MG capsule Take 1 capsule (250 mg total) by mouth 2 (two) times daily. Patient not taking: Reported on 12/17/2015 11/14/15   Danie Binder, MD    Allergies as of 11/29/2015 - Review Complete 11/12/2015  Allergen Reaction Noted  . Ciprofloxacin Other (See Comments) 11/08/2015  . Morphine and related Other (See Comments) 11/12/2015  . Adhesive [tape] Rash 04/04/2013    Family History  Problem Relation Age of Onset  . Hypertension Mother   . Hypertension Father   . Melanoma Paternal Uncle   . Prader-Willi syndrome Brother   . Diabetes Brother   . Hypertension Brother   . COPD Maternal Grandfather   . COPD Paternal Grandmother     Social History   Social History  . Marital Status: Married    Spouse Name: N/A  . Number of Children: N/A  . Years of Education: 16   Occupational History  . nurse  Social History Main Topics  . Smoking status: Never Smoker   . Smokeless tobacco: Not on file  . Alcohol Use: No  . Drug Use: No  . Sexual Activity: Yes    Birth Control/ Protection: IUD   Other Topics Concern  . Not on file   Social History Narrative    Review of Systems: See HPI, otherwise negative ROS   Physical Exam: BP 129/85 mmHg  Pulse 85  Temp(Src) 98.1 F (36.7 C) (Oral)  Resp 19  Ht 5\' 7"  (1.702 m)  Wt 240 lb (108.863 kg)  BMI 37.58 kg/m2  SpO2 96% General:   Alert,  pleasant and cooperative in NAD Head:  Normocephalic and atraumatic. Neck:   Supple; Lungs:  Clear throughout to auscultation.    Heart:  Regular rate and rhythm. Abdomen:  Soft, nontender and nondistended. Normal bowel sounds, without guarding, and without rebound.   Neurologic:  Alert and  oriented x4;  grossly normal neurologically.  Impression/Plan:     Abnormal CT scan  Plan:  TCS TODAY

## 2015-12-27 NOTE — Op Note (Addendum)
[facility] [address] [place], [address]   COLONOSCOPY PROCEDURE REPORT  PATIENT: Cynthia, Ochoa  MR#: [id number] BIRTHDATE: 12/07/1975 , 39  yrs. old GENDER: female ENDOSCOPIST: Danie Binder, MD REFERRED [id number] Hall, M.D.  Fanny Skates, M.D. PROCEDURE DATE:  2016-01-18 PROCEDURE:   Colonoscopy, diagnostic INDICATIONS:Uncomplicated MID TC DIVERTICULITIS NOV [id number] AND COMPLICATED DISTAL TC DIVERTICULITIS DEC 2017 WITH PERFORATION. MEDICATIONS: Promethazine (Phenergan) 25 mg IV, Demerol 75 mg IV, and Versed 5 mg IV MD INITATED SEDATION: [id number]. PROCEDURE COMPLETE: 0912 DESCRIPTION OF PROCEDURE:    Physical exam was performed.  Informed consent was obtained from the patient after explaining the benefits, risks, and alternatives to procedure.  The patient was connected to monitor and placed in left lateral position. Continuous oxygen was provided by nasal cannula and IV medicine administered through an indwelling cannula.  After administration of sedation and rectal exam, the patients rectum was intubated and the [id number]  colonoscope was advanced under direct visualization to the cecum.  The scope was removed slowly by carefully examining the color, texture, anatomy, and integrity mucosa on the way out.  The patient was recovered in endoscopy and discharged home in satisfactory condition. Estimated blood loss is zero unless otherwise noted in this procedure report.    COLON FINDINGS: There was mild diverticulosis noted in the descending colon, sigmoid colon, at the hepatic flexure, and in the transverse colon with associated muscular hypertrophy.  , The colon was redundant.  Manual abdominal counter-pressure was used to reach the cecum, Small internal hemorrhoids were found.  , PRIOR HEMORRHOID BANDING SITES x4 SEEN.  MODERATE INTERNAL HEMORRHOIDS, and 3 CC SPOT INJECTED AT MOST PROXIMAL DIVERTICULUM IN THE HEPATIC FLEXURE.  FEW DIVERTICULA  SEE IN TEH TRANSVERSE COLON.  FREQUENT DIVERTICULA SEEN IN THE DESCENDING AND SIGMOID COLON.    PREP QUALITY: excellent.  CECAL W/D TIME: 13       minutes COMPLICATIONS: None  ENDOSCOPIC IMPRESSION: 1.   Mild diverticulosis-Hepatic flexure, & transverse colon. MODERATE DIVERTICULOSIS-DESCENDING/SIGMOID COLON 2.   Small internal hemorrhoids  RECOMMENDATIONS: HIGH FIBER DIET NEXT TCS AT AGE 40  eSigned:  Danie Binder, MD 18-Jan-2016 9:33 AM  CPT CODES: ICD CODES:  The ICD and CPT codes recommended by this software are interpretations from the data that the clinical staff has captured with the software.  The verification of the translation of this report to the ICD and CPT codes and modifiers is the sole responsibility of the health care institution and practicing physician where this report was generated.  [employer] will not be held responsible for the validity of the ICD and CPT codes included on this report.  AMA assumes no liability for data contained or not contained herein. CPT is a Designer, television/film set of the [employer].

## 2015-12-27 NOTE — Discharge Instructions (Signed)
You have small internal  And moderate external hemorrhoids and diverticulosis IN YOUR transverse, descending, and sigmoid COLON.   Follow a HIGH FIBER DIET. AVOID ITEMS THAT CAUSE BLOATING. See info below.  Next colonoscopy at age 40.  Colonoscopy Care After Read the instructions outlined below and refer to this sheet in the next week. These discharge instructions provide you with general information on caring for yourself after you leave the hospital. While your treatment has been planned according to the most current medical practices available, unavoidable complications occasionally occur. If you have any problems or questions after discharge, call DR. Tatym Schermer, [phone].  ACTIVITY  You may resume your regular activity, but move at a slower pace for the next 24 hours.   Take frequent rest periods for the next 24 hours.   Walking will help get rid of the air and reduce the bloated feeling in your belly (abdomen).   No driving for 24 hours (because of the medicine (anesthesia) used during the test).   You may shower.   Do not sign any important legal documents or operate any machinery for 24 hours (because of the anesthesia used during the test).    NUTRITION  Drink plenty of fluids.   You may resume your normal diet as instructed by your doctor.   Begin with a light meal and progress to your normal diet. Heavy or fried foods are harder to digest and may make you feel sick to your stomach (nauseated).   Avoid alcoholic beverages for 24 hours or as instructed.    MEDICATIONS  You may resume your normal medications.   WHAT YOU CAN EXPECT TODAY  Some feelings of bloating in the abdomen.   Passage of more gas than usual.   Spotting of blood in your stool or on the toilet paper  .  IF YOU HAD POLYPS REMOVED DURING THE COLONOSCOPY:  Eat a soft diet IF YOU HAVE NAUSEA, BLOATING, ABDOMINAL PAIN, OR VOMITING.    FINDING OUT THE RESULTS OF YOUR TEST Not all test  results are available during your visit. DR. Oneida Alar WILL CALL YOU WITHIN 7 DAYS OF YOUR PROCEDUE WITH YOUR RESULTS. Do not assume everything is normal if you have not heard from DR. Vanita Cannell IN ONE WEEK, CALL HER OFFICE AT [phone].  SEEK IMMEDIATE MEDICAL ATTENTION AND CALL THE OFFICE: [phone] IF:  You have more than a spotting of blood in your stool.   Your belly is swollen (abdominal distention).   You are nauseated or vomiting.   You have a temperature over 101F.   You have abdominal pain or discomfort that is severe or gets worse throughout the day.  High-Fiber Diet A high-fiber diet changes your normal diet to include more whole grains, legumes, fruits, and vegetables. Changes in the diet involve replacing refined carbohydrates with unrefined foods. The calorie level of the diet is essentially unchanged. The Dietary Reference Intake (recommended amount) for adult males is 38 grams per day. For adult females, it is 25 grams per day. Pregnant and lactating women should consume 28 grams of fiber per day. Fiber is the intact part of a plant that is not broken down during digestion. Functional fiber is fiber that has been isolated from the plant to provide a beneficial effect in the body. PURPOSE  Increase stool bulk.   Ease and regulate bowel movements.   Lower cholesterol. REDUCE RISK OF COLON CANCER  INDICATIONS THAT YOU NEED MORE FIBER  Constipation and hemorrhoids.   Uncomplicated diverticulosis (intestine  condition) and irritable bowel syndrome.   Weight management.   As a protective measure against hardening of the arteries (atherosclerosis), diabetes, and cancer.   GUIDELINES FOR INCREASING FIBER IN THE DIET  Start adding fiber to the diet slowly. A gradual increase of about 5 more grams (2 slices of whole-wheat bread, 2 servings of most fruits or vegetables, or 1 bowl of high-fiber cereal) per day is best. Too rapid an increase in fiber may result in  constipation, flatulence, and bloating.   Drink enough water and fluids to keep your urine clear or pale yellow. Water, juice, or caffeine-free drinks are recommended. Not drinking enough fluid may cause constipation.   Eat a variety of high-fiber foods rather than one type of fiber.   Try to increase your intake of fiber through using high-fiber foods rather than fiber pills or supplements that contain small amounts of fiber.   The goal is to change the types of food eaten. Do not supplement your present diet with high-fiber foods, but replace foods in your present diet.   INCLUDE A VARIETY OF FIBER SOURCES  Replace refined and processed grains with whole grains, canned fruits with fresh fruits, and incorporate other fiber sources. White rice, white breads, and most bakery goods contain little or no fiber.   Brown whole-grain rice, buckwheat oats, and many fruits and vegetables are all good sources of fiber. These include: broccoli, Brussels sprouts, cabbage, cauliflower, beets, sweet potatoes, white potatoes (skin on), carrots, tomatoes, eggplant, squash, berries, fresh fruits, and dried fruits.   Cereals appear to be the richest source of fiber. Cereal fiber is found in whole grains and bran. Bran is the fiber-rich outer coat of cereal grain, which is largely removed in refining. In whole-grain cereals, the bran remains. In breakfast cereals, the largest amount of fiber is found in those with "bran" in their names. The fiber content is sometimes indicated on the label.   You may need to include additional fruits and vegetables each day.   In baking, for 1 cup white flour, you may use the following substitutions:   1 cup whole-wheat flour minus 2 tablespoons.   1/2 cup white flour plus 1/2 cup whole-wheat flour.   Diverticulosis Diverticulosis is a common condition that develops when small pouches (diverticula) form in the wall of the colon. The risk of diverticulosis increases with age.  It happens more often in people who eat a low-fiber diet. Most individuals with diverticulosis have no symptoms. Those individuals with symptoms usually experience belly (abdominal) pain, constipation, or loose stools (diarrhea).  HOME CARE INSTRUCTIONS  Increase the amount of fiber in your diet as directed by your caregiver or dietician. This may reduce symptoms of diverticulosis.   Drink at least 6 to 8 glasses of water each day to prevent constipation.   Try not to strain when you have a bowel movement.   Avoiding nuts and seeds to prevent complications is NOT NECESSARY.    FOODS HAVING HIGH FIBER CONTENT INCLUDE:  Fruits. Apple, peach, pear, tangerine, raisins, prunes.   Vegetables. Brussels sprouts, asparagus, broccoli, cabbage, carrot, cauliflower, romaine lettuce, spinach, summer squash, tomato, winter squash, zucchini.   Starchy Vegetables. Baked beans, kidney beans, lima beans, split peas, lentils, potatoes (with skin).   Grains. Whole wheat bread, brown rice, bran flake cereal, plain oatmeal, white rice, shredded wheat, bran muffins.   Hemorrhoids Hemorrhoids are dilated (enlarged) veins around the rectum. Sometimes clots will form in the veins. This makes them swollen and painful.  These are called thrombosed hemorrhoids. Causes of hemorrhoids include:  Constipation.   Straining to have a bowel movement.   HEAVY LIFTING  HOME CARE INSTRUCTIONS  Eat a well balanced diet and drink 6 to 8 glasses of water every day to avoid constipation. You may also use a bulk laxative.   Avoid straining to have bowel movements.   Keep anal area dry and clean.   Do not use a donut shaped pillow or sit on the toilet for long periods. This increases blood pooling and pain.   Move your bowels when your body has the urge; this will require less straining and will decrease pain and pressure.

## 2015-12-28 NOTE — Discharge Summary (Signed)
Physician Discharge Summary  Cynthia Ochoa [id number] DOB: 1976-02-15 DOA: 11/12/2015  PCP: Wende Neighbors, MD  Admit date: 11/12/2015 Discharge date: 12/28/2015  Time spent: 35 minutes  Recommendations for Outpatient Follow-up:  1. Follow up with Dr Oneida Alar as scheduled.  Discharge Diagnoses:  Principal Problem:   Diverticulitis of intestine with perforation Active Problems:   Perforated intestine ([place])   Acute diverticulitis of intestine   Discharge Condition: improved.  Able to tolerate diet without pain.  Diet recommendation: Diverticular disease diet as discussed.   Filed Weights   11/12/15 0440  Weight: 111.131 kg (245 lb)    History of present illness: patient was admitted for diverticulitis on Nov 11 2016 by Dr Jerilee Hoh.  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 In November she had an episode of diverticulitis and was placed on Augmentin after a Cipro allergy. She is scheduled for outpatient endoscopy today and was taking her prep, however 4:30 AM she started to have severe abdominal pain as well as nausea and vomiting. She waited a few hours at home. However as her symptoms got worse she came to the hospital for evaluation. She was found to have a white count of 18.5, on CT scan was found to have diverticulitis of the distal transverse colon with associated small contained perforation and no abscess. Her prior episode of diverticulitis at the mid transverse colon has resolved. We have been asked to admit her for further evaluation and management.   Hospital Course: Patient was admitted into the hospital and was started on IV Flagyl and Cipro, and she was given IVF and bowel rest.  She required IV pain medication as well.  She was seen in consultation with Dr Eden Lathe, and her diet was advanced as she was improving.  Dr Eden Lathe actually was the  one discharged her home having follow up with Dr Oneida Alar.  She consulted Dr Arnoldo Morale as she knows him, and she works in the endoscopy suite here at [employer]. She was discharged on cipro and Flagyl, and her hospital stay was with no complication.     Consultations:  Dr Oneida Alar.   [facility] Signs.   Discharge Exam: Filed Vitals:   11/14/15 0519 11/14/15 1425  BP: 123/78 134/93  Pulse: 87 91  Temp: 98.3 F (36.8 C) 97.7 F (36.5 C)  Resp: 20 18    Discharge Instructions   Discharge Medication List as of 11/14/2015  4:26 PM    START taking these medications   Details  saccharomyces boulardii (FLORASTOR) 250 MG capsule Take 1 capsule (250 mg total) by mouth 2 (two) times daily., Starting 11/14/2015, Until Discontinued, Normal      CONTINUE these medications which have NOT CHANGED   Details  phentermine 37.5 MG capsule Take 37.5 mg by mouth every morning., Until Discontinued, Historical Med    amoxicillin-clavulanate (AUGMENTIN) 875-125 MG tablet Take 1 tablet by mouth 2 (two) times daily., Starting 11/14/2015, Until Discontinued, Normal    cyclobenzaprine (FLEXERIL) 5 MG tablet 1-2 PO Q6H PRN BACK SPASM, Normal    diazepam (VALIUM) 2 MG tablet Take 1 tablet (2 mg total) by mouth every 8 (eight) hours as needed for muscle spasms., Starting 10/15/2015, Until Discontinued, Print    HYDROcodone-acetaminophen (NORCO/VICODIN) 5-325 MG tablet Take 1 tablet by mouth every 6 (six) hours as needed for moderate pain., Starting 10/12/2015, Until  Discontinued, Print    metroNIDAZOLE (FLAGYL) 500 MG tablet 1 PO  TID FOR 14 DAYS, Normal    ondansetron (ZOFRAN ODT) 4 MG disintegrating tablet 4mg  ODT q4 hours prn nausea/vomit, Print    oxyCODONE-acetaminophen (PERCOCET/ROXICET) 5-325 MG tablet Take 1-2 tablets by mouth every 6 (six) hours as needed for severe pain., Starting 11/14/2015, Until Discontinued, Print    promethazine (PHENERGAN) 12.5 MG suppository 1-2 pr q4-6h prn nausea or  vomiting, Normal      STOP taking these medications     acidophilus (RISAQUAD) CAPS capsule      ciprofloxacin (CIPRO) 500 MG tablet        Allergies  Allergen Reactions  . Ciprofloxacin Other (See Comments)    Nerve pain.  Marland Kitchen Morphine And Related Other (See Comments)    CHEST PRESSURE  . Adhesive [Tape] Rash   Follow-up Information    Follow up with Barney Drain, MD On 12/19/2015.   Specialty:  Gastroenterology   Why:  at 9:00 am   Contact information:   [facility] [address] [place] [address] [phone]        The results of significant diagnostics from this hospitalization (including imaging, microbiology, ancillary and laboratory) are listed below for reference.     SignedOrvan Falconer MD.  [facility] 12/28/2015, 7:28 PM

## 2015-12-30 ENCOUNTER — Encounter (HOSPITAL_COMMUNITY): Payer: Self-pay | Admitting: Gastroenterology

## 2016-01-07 DIAGNOSIS — Z8619 Personal history of other infectious and parasitic diseases: Secondary | ICD-10-CM | POA: Diagnosis not present

## 2016-01-07 DIAGNOSIS — K5732 Diverticulitis of large intestine without perforation or abscess without bleeding: Secondary | ICD-10-CM | POA: Diagnosis not present

## 2016-01-07 DIAGNOSIS — Z6839 Body mass index (BMI) 39.0-39.9, adult: Secondary | ICD-10-CM | POA: Diagnosis not present

## 2016-02-06 MED FILL — PHENTERMINE 37.5 MG TABLET: 37.5 | 30 days supply | Qty: 30 | Fill #0

## 2016-03-04 IMAGING — CT CT ABD-PELV W/ CM
2 of 4 series · 17 of 46 positions shown, 19 images · IV contrast (Omnipaque 300)
Comparison: CT abdomen and pelvis 10/12/2015 and 08/30/2008.

CLINICAL DATA: Acute left side abdominal pain and vomiting after
bowel prep for diverticulitis follow-up.

EXAM:
CT ABDOMEN AND PELVIS WITH CONTRAST
TECHNIQUE: Multidetector CT imaging of the abdomen and pelvis was performed
using the standard protocol following bolus administration of
intravenous contrast.
CONTRAST:  100 mL OMNIPAQUE IOHEXOL 300 MG/ML  SOLN

[Series 2: abd_pel_with 5.0 b40f · axial · 0.93mm/px · z∈[-508,-32]mm · 14 of 105 slices shown, 16 images]
[im 5/105  soft-tissue]
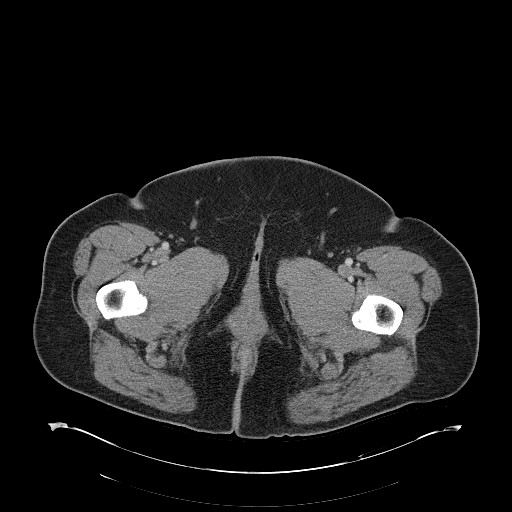
[im 5/105  bone]
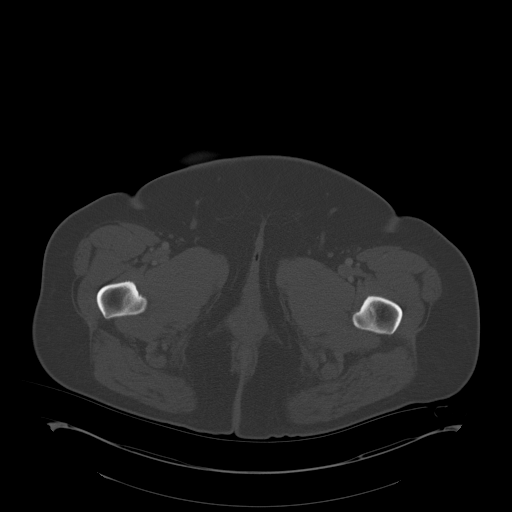
[im 14/105  soft-tissue]
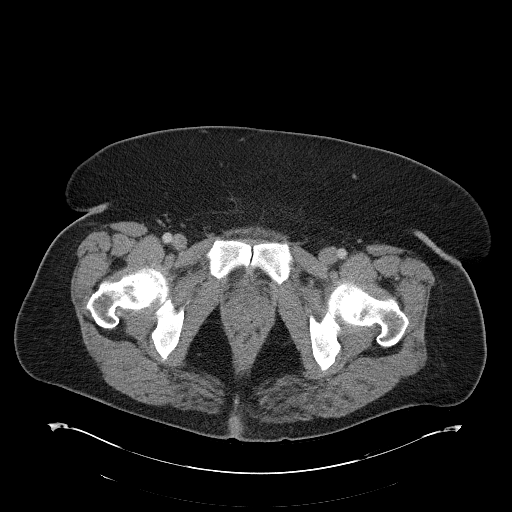
[im 22/105  soft-tissue]
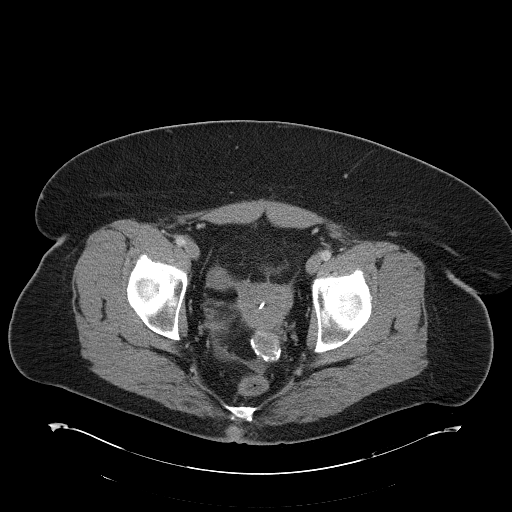
[im 27/105  soft-tissue]
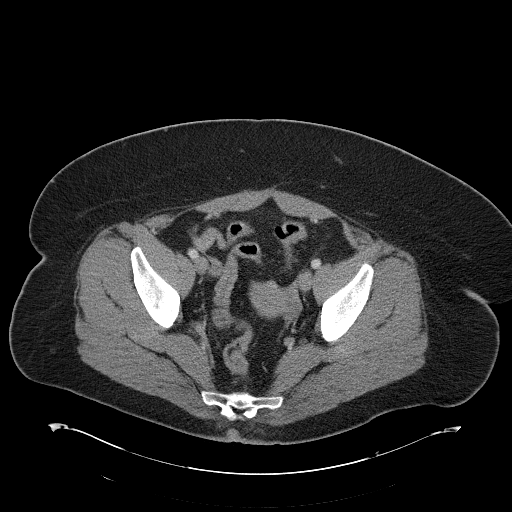
[im 35/105  soft-tissue]
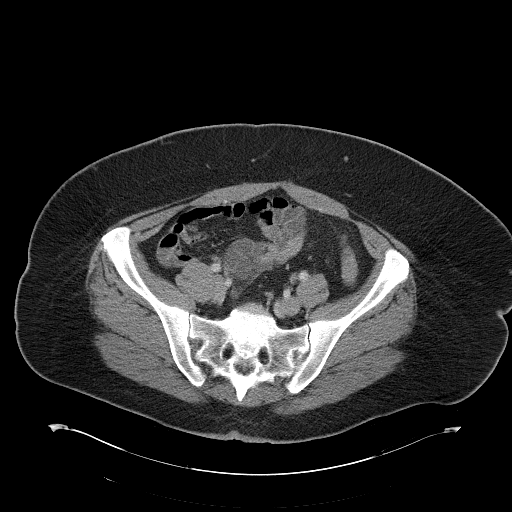
[im 44/105  soft-tissue]
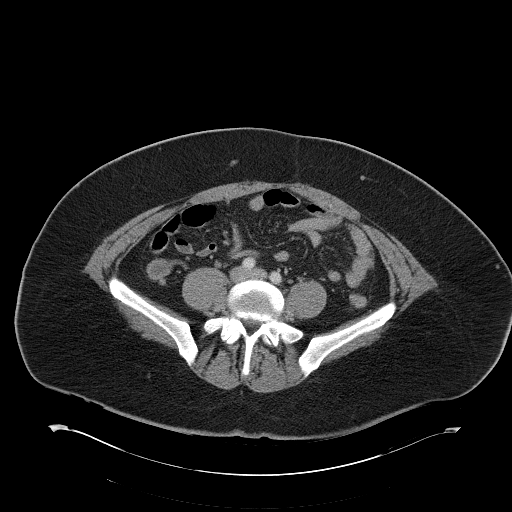
[im 48/105  soft-tissue]
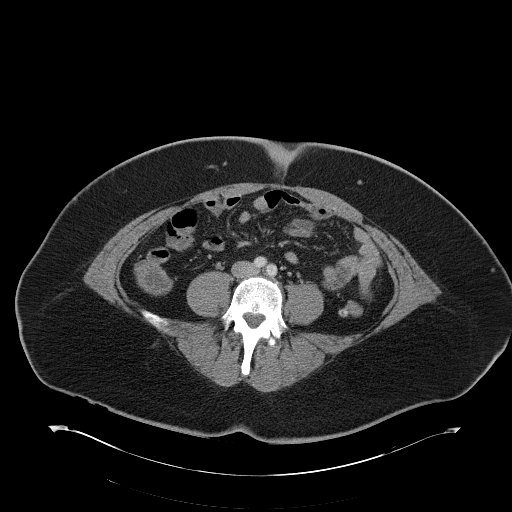
[im 57/105  soft-tissue]
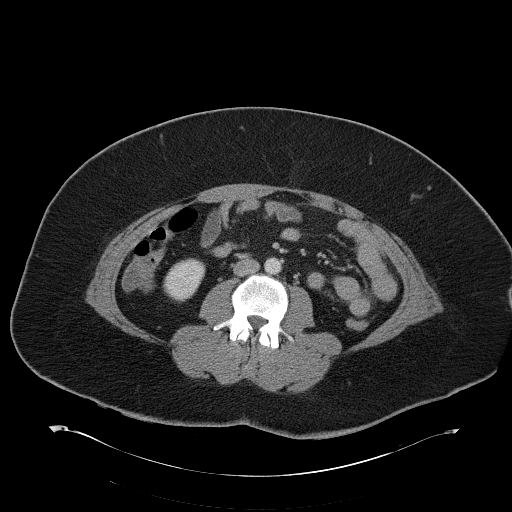
[im 61/105  soft-tissue]
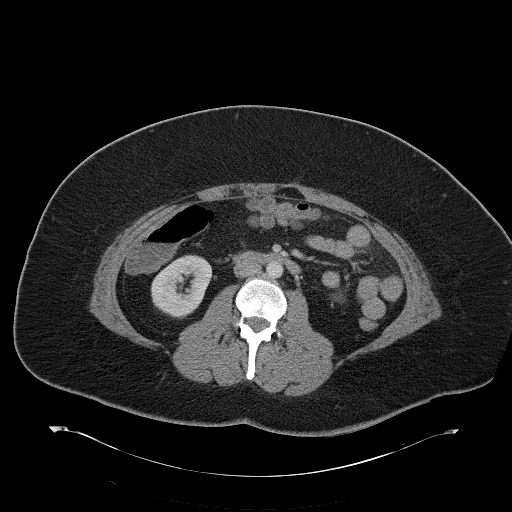
[im 61/105  bone]
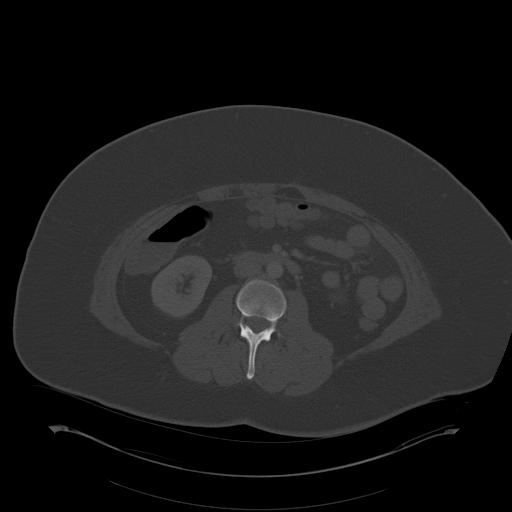
[im 70/105  soft-tissue]
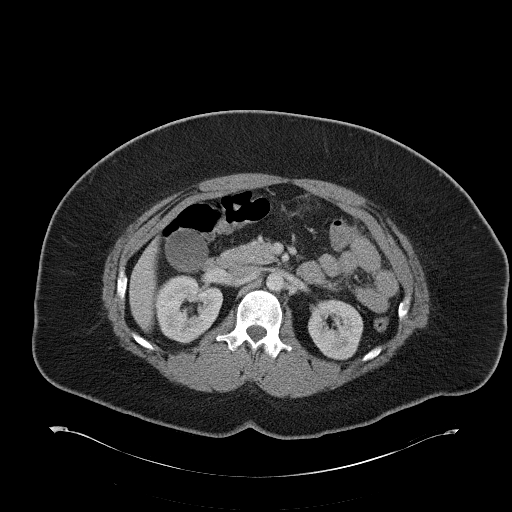
[im 79/105  soft-tissue]
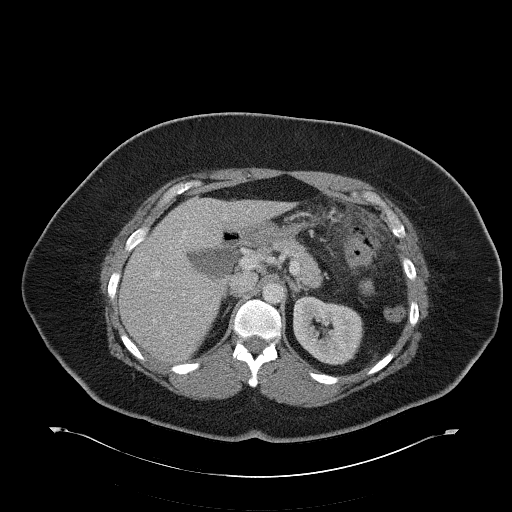
[im 83/105  soft-tissue]
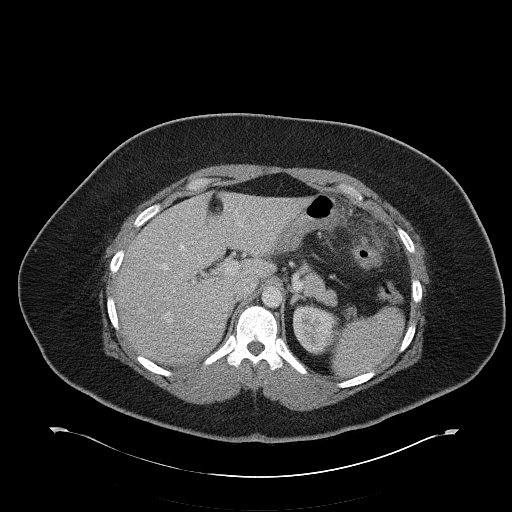
[im 92/105  soft-tissue]
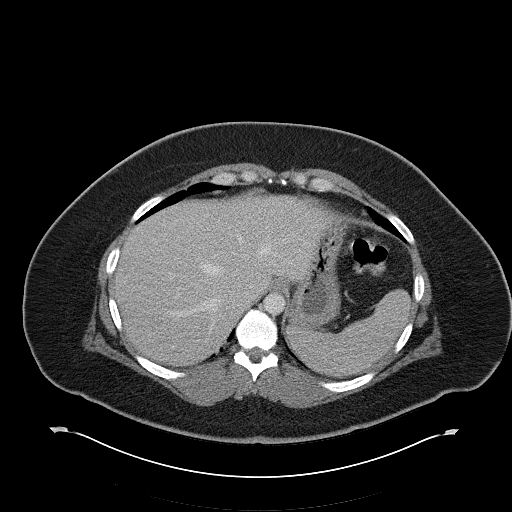
[im 100/105  soft-tissue]
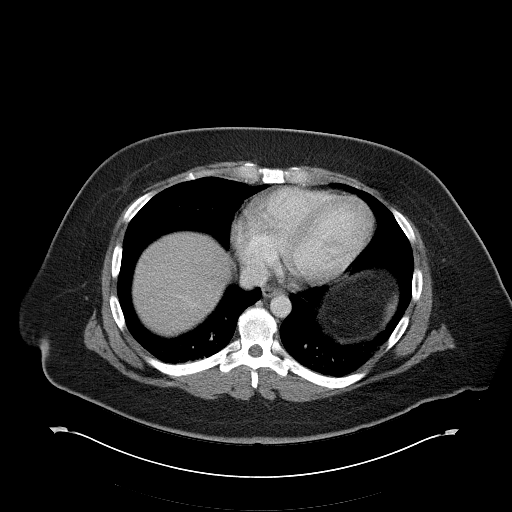

[Series 3: abd_pel_with 3.0 spo cor · coronal · 0.93mm/px · 3 of 105 slices shown]
[im 35/105  soft-tissue]
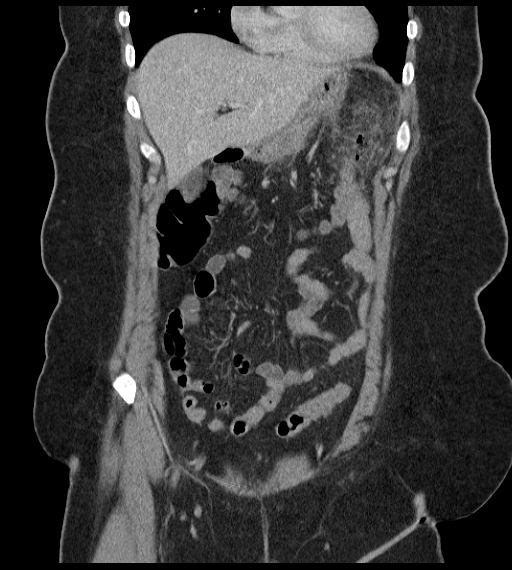
[im 47/105  soft-tissue]
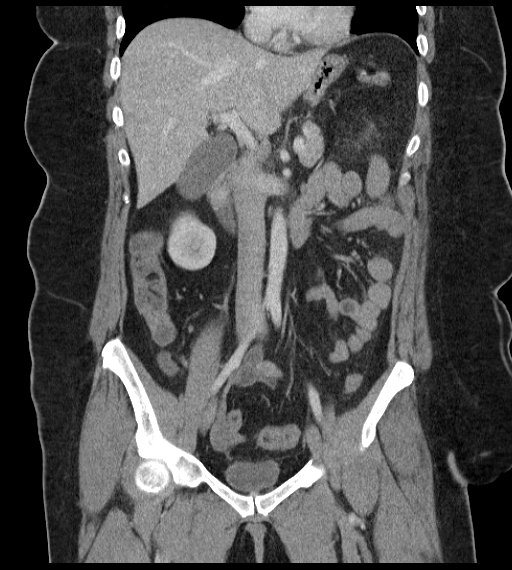
[im 58/105  soft-tissue]
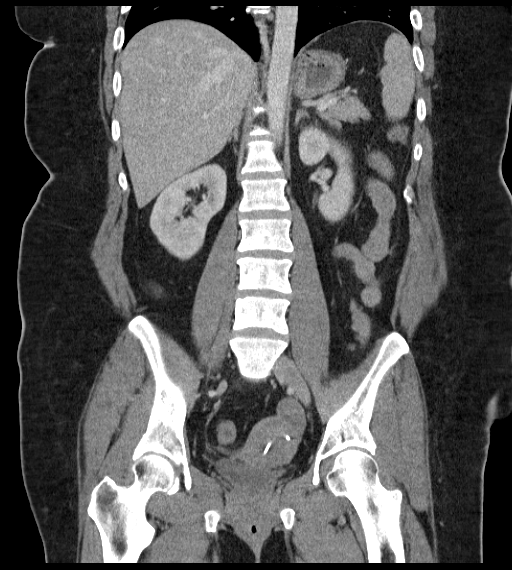

[17 of 46 positions shown; findings below may reference images not displayed]

FINDINGS: Mild dependent atelectasis is seen in the lung bases. No pleural or
pericardial effusion.

There is wall thickening and pericolonic stranding about the distal
transverse colon with a small volume of extra luminal air about the
anterior wall of the colon in this location most compatible with
diverticulitis and contained perforation. No abscess is identified.
Diverticulitis in the transverse colon seen on the prior examination
has resolved. The colon is otherwise unremarkable. The stomach,
small bowel and appendix appear normal.

The gallbladder, liver, spleen, adrenal glands, pancreas and kidneys
are unremarkable.

IUD is in place in the uterus. Pedunculated, calcified uterine
fibroid is again seen. Adnexa and urinary bladder appear normal.
There is no lymphadenopathy or fluid.

No focal bony abnormality is identified.
IMPRESSION: Findings most consistent with diverticulitis of the distal
transverse colon with associated small contained perforation. No
abscess.

Diverticulitis of the mid transverse colon seen on the comparison CT
scan has resolved.

## 2016-06-01 DIAGNOSIS — Z01419 Encounter for gynecological examination (general) (routine) without abnormal findings: Secondary | ICD-10-CM | POA: Diagnosis not present

## 2016-06-01 DIAGNOSIS — Z6841 Body Mass Index (BMI) 40.0 and over, adult: Secondary | ICD-10-CM | POA: Diagnosis not present

## 2016-06-01 DIAGNOSIS — E782 Mixed hyperlipidemia: Secondary | ICD-10-CM | POA: Diagnosis not present

## 2016-06-03 MED FILL — DIETHYLPROPION 75 MG TAB ER: 75 | 30 days supply | Qty: 30 | Fill #0

## 2016-06-08 DIAGNOSIS — I10 Essential (primary) hypertension: Secondary | ICD-10-CM | POA: Diagnosis not present

## 2016-06-08 DIAGNOSIS — F339 Major depressive disorder, recurrent, unspecified: Secondary | ICD-10-CM | POA: Diagnosis not present

## 2016-08-17 DIAGNOSIS — L814 Other melanin hyperpigmentation: Secondary | ICD-10-CM | POA: Diagnosis not present

## 2016-08-17 DIAGNOSIS — L738 Other specified follicular disorders: Secondary | ICD-10-CM | POA: Diagnosis not present

## 2016-08-17 DIAGNOSIS — D2261 Melanocytic nevi of right upper limb, including shoulder: Secondary | ICD-10-CM | POA: Diagnosis not present

## 2016-08-17 DIAGNOSIS — L821 Other seborrheic keratosis: Secondary | ICD-10-CM | POA: Diagnosis not present

## 2016-08-17 DIAGNOSIS — D2272 Melanocytic nevi of left lower limb, including hip: Secondary | ICD-10-CM | POA: Diagnosis not present

## 2016-08-17 DIAGNOSIS — D225 Melanocytic nevi of trunk: Secondary | ICD-10-CM | POA: Diagnosis not present

## 2016-08-17 DIAGNOSIS — D2271 Melanocytic nevi of right lower limb, including hip: Secondary | ICD-10-CM | POA: Diagnosis not present

## 2016-08-17 DIAGNOSIS — D2262 Melanocytic nevi of left upper limb, including shoulder: Secondary | ICD-10-CM | POA: Diagnosis not present

## 2016-09-13 ENCOUNTER — Encounter (HOSPITAL_COMMUNITY): Payer: Self-pay | Admitting: Emergency Medicine

## 2016-09-13 ENCOUNTER — Observation Stay (HOSPITAL_COMMUNITY)
Admission: EM | Admit: 2016-09-13 | Discharge: 2016-09-14 | Disposition: A | Discharge status: Home / Self Care | Payer: 59 | Attending: General Surgery | Admitting: General Surgery

## 2016-09-13 ENCOUNTER — Emergency Department (HOSPITAL_COMMUNITY): Payer: 59

## 2016-09-13 ENCOUNTER — Telehealth (INDEPENDENT_AMBULATORY_CARE_PROVIDER_SITE_OTHER): Payer: Self-pay | Admitting: Internal Medicine

## 2016-09-13 DIAGNOSIS — K3580 Unspecified acute appendicitis: Secondary | ICD-10-CM | POA: Diagnosis not present

## 2016-09-13 DIAGNOSIS — R1031 Right lower quadrant pain: Secondary | ICD-10-CM | POA: Diagnosis not present

## 2016-09-13 DIAGNOSIS — E669 Obesity, unspecified: Secondary | ICD-10-CM | POA: Insufficient documentation

## 2016-09-13 DIAGNOSIS — Z6841 Body Mass Index (BMI) 40.0 and over, adult: Secondary | ICD-10-CM | POA: Diagnosis not present

## 2016-09-13 DIAGNOSIS — K37 Unspecified appendicitis: Secondary | ICD-10-CM | POA: Diagnosis not present

## 2016-09-13 DIAGNOSIS — R109 Unspecified abdominal pain: Secondary | ICD-10-CM | POA: Diagnosis present

## 2016-09-13 LAB — URINE MICROSCOPIC-ADD ON

## 2016-09-13 LAB — CBC
HCT: 42.2 % (ref 36.0–46.0)
Hemoglobin: 14.6 g/dL (ref 12.0–15.0)
MCH: 29.7 pg (ref 26.0–34.0)
MCHC: 34.6 g/dL (ref 30.0–36.0)
MCV: 85.8 fL (ref 78.0–100.0)
PLATELETS: 205 10*3/uL (ref 150–400)
RBC: 4.92 MIL/uL (ref 3.87–5.11)
RDW: 13.3 % (ref 11.5–15.5)
WBC: 14.3 10*3/uL — ABNORMAL HIGH (ref 4.0–10.5)

## 2016-09-13 LAB — URINALYSIS, ROUTINE W REFLEX MICROSCOPIC
BILIRUBIN URINE: NEGATIVE
Glucose, UA: NEGATIVE mg/dL
Ketones, ur: NEGATIVE mg/dL
Leukocytes, UA: NEGATIVE
Nitrite: NEGATIVE
PROTEIN: NEGATIVE mg/dL
SPECIFIC GRAVITY, URINE: 1.01 (ref 1.005–1.030)
pH: 5.5 (ref 5.0–8.0)

## 2016-09-13 LAB — COMPREHENSIVE METABOLIC PANEL
ALBUMIN: 4.1 g/dL (ref 3.5–5.0)
ALK PHOS: 65 U/L (ref 38–126)
ALT: 19 U/L (ref 14–54)
AST: 19 U/L (ref 15–41)
Anion gap: 7 (ref 5–15)
BUN: 6 mg/dL (ref 6–20)
CALCIUM: 8.8 mg/dL — AB (ref 8.9–10.3)
CO2: 24 mmol/L (ref 22–32)
CREATININE: 0.75 mg/dL (ref 0.44–1.00)
Chloride: 107 mmol/L (ref 101–111)
GFR calc Af Amer: >60 mL/min (ref >60)
GFR calc non Af Amer: >60 mL/min (ref >60)
GLUCOSE: 124 mg/dL — AB (ref 65–99)
Potassium: 3.7 mmol/L (ref 3.5–5.1)
SODIUM: 138 mmol/L (ref 135–145)
Total Bilirubin: 0.6 mg/dL (ref 0.3–1.2)
Total Protein: 7 g/dL (ref 6.5–8.1)

## 2016-09-13 LAB — DIFFERENTIAL
BASOS ABS: 0 10*3/uL (ref 0.0–0.1)
Basophils Relative: 0 %
Eosinophils Absolute: 0.2 10*3/uL (ref 0.0–0.7)
Eosinophils Relative: 1 %
LYMPHS PCT: 14 %
Lymphs Abs: 2 10*3/uL (ref 0.7–4.0)
MONO ABS: 0.9 10*3/uL (ref 0.1–1.0)
MONOS PCT: 6 %
NEUTROS ABS: 11.4 10*3/uL — AB (ref 1.7–7.7)
Neutrophils Relative %: 79 %

## 2016-09-13 LAB — LIPASE, BLOOD: Lipase: 16 U/L (ref 11–51)

## 2016-09-13 LAB — TROPONIN I: Troponin I: <0.03 ng/mL (ref <0.03)

## 2016-09-13 LAB — HCG, QUANTITATIVE, PREGNANCY: hCG, Beta Chain, Quant, S: <1 m[IU]/mL (ref <5)

## 2016-09-13 MED ORDER — IOPAMIDOL (ISOVUE-300) INJECTION 61%
INTRAVENOUS | Status: AC
  Administered 2016-09-13: 100 mL
  Filled 2016-09-13: qty 30

## 2016-09-13 MED ORDER — ONDANSETRON HCL 4 MG/2ML IJ SOLN
4.0000 mg | Freq: Once | INTRAMUSCULAR | Status: AC
  Administered 2016-09-13: 4 mg via INTRAVENOUS
  Filled 2016-09-13: qty 2

## 2016-09-13 NOTE — ED Provider Notes (Signed)
[facility] DEPT Provider Note   CSN: [id number] Arrival date & time: 09/13/16  1522   By signing my name below, I, Cynthia Ochoa, attest that this documentation has been prepared under the direction and in the presence of Cynthia Baller, NP Electronically Signed: Macon Ochoa, ED Scribe. 09/13/16. 6:51 PM.  History   Chief Complaint Chief Complaint  Patient presents with  . Abdominal Pain   The history is provided by the patient. No language interpreter was used.   HPI Comments: Cynthia Ochoa is a 40 y.o. female who presents to the Emergency Department complaining of gradually worsening, left-sided shoulder pain with associated intermittent nausea onset today. She reports associated sharp, stabbing shoulder pain. Pt denies CP, SOB, apnea. She also reports having RLQ abdominal pain and tenderness with intermittent episodes of diarrhea onset last night. She notes having multiple episodes of diarrhea ~9 PM last night. She reports having a small bowel movement today ~2 PM. Pt states she took a bath and reports taking one oxycodone last night for abdominal pain with minimal relief. She also notes having a wave of chills onset today. Per pt, she notes having an episode of diverticulitis in December. Pt denies HA, fever, urinary symptoms. No additional complaints at this time.   Past Medical History:  Diagnosis Date  . Abnormal Pap smear   . Diverticulitis   . Fibroid   . Hx of campylobacteriosis 2005  . Hx of Salmonella gastroenteritis   . IBS (irritable bowel syndrome)     Patient Active Problem List   Diagnosis Date Noted  . Appendicitis, acute 09/14/2016  . Diverticulitis of colon   . Perforated intestine ([place]) 11/12/2015  . Diverticulitis of intestine with perforation 11/12/2015  . Acute diverticulitis of intestine 11/12/2015  . Prolapsed internal hemorrhoids, grade 3 11/08/2014  . Rotator cuff syndrome of left shoulder 07/05/2012  . IMPINGEMENT SYNDROME 03/03/2010    . RUPTURE ROTATOR CUFF 03/03/2010  . NAUSEA WITH VOMITING 12/04/2009  . EAR PAIN, LEFT 02/20/2009  . OBESITY, UNSPECIFIED 12/19/2007  . ANXIETY DISORDER, GENERALIZED 12/19/2007  . SINUSITIS, ACUTE 12/19/2007  . BRONCHITIS, ACUTE 12/19/2007    Past Surgical History:  Procedure Laterality Date  . CERVICAL CONIZATION W/BX N/A 09/07/2013   Procedure: CONIZATION CERVIX WITH BIOPSY/REMOVAL OF IUD AND INSERTION;  Surgeon: Cyril Mourning, MD;  Location: [facility];  Service: Gynecology;  Laterality: N/A;  . COLONOSCOPY  2005 NUR   I/E HEMORRHOIDS, TIC AT HF, NL TI  . COLONOSCOPY N/A 12/27/2015   Procedure: COLONOSCOPY;  Surgeon: Danie Binder, MD;  Location: [facility];  Service: Endoscopy;  Laterality: N/A;  0830  . COLPOSCOPY    . CYSTECTOMY     abdomen  . GYNECOLOGIC CRYOSURGERY    . VAGINAL DELIVERY  04/07/13  . WISDOM TOOTH EXTRACTION      OB History    Gravida Para Term Preterm AB Living   1 1 1     1    SAB TAB Ectopic Multiple Live Births           1       Home Medications    Prior to Admission medications   Medication Sig Start Date End Date Taking? Authorizing Provider  metroNIDAZOLE (FLAGYL) 500 MG tablet Take 500 mg by mouth 3 (three) times daily. 14 day course starting on 09/13/2016   Yes Historical Provider, MD  naproxen sodium (TH NAPROXEN) 220 MG tablet Take 440 mg by mouth once as needed (for pain).   Yes  Historical Provider, MD  oxyCODONE-acetaminophen (PERCOCET/ROXICET) 5-325 MG tablet Take 1 tablet by mouth once as needed for moderate pain or severe pain.   Yes Historical Provider, MD  sulfamethoxazole-trimethoprim (BACTRIM DS,SEPTRA DS) 800-160 MG tablet Take 1 tablet by mouth 2 (two) times daily. 14 day course starting on 09/13/2016   Yes Historical Provider, MD    Family History Family History  Problem Relation Age of Onset  . Hypertension Mother   . Hypertension Father   . Melanoma Paternal Uncle   . Prader-Willi syndrome Brother   . Diabetes  Brother   . Hypertension Brother   . COPD Maternal Grandfather   . COPD Paternal Grandmother     Social History Social History  Substance Use Topics  . Smoking status: Never Smoker  . Smokeless tobacco: Never Used  . Alcohol use No     Allergies   Ciprofloxacin; Morphine and related; and Adhesive [tape]   Review of Systems Review of Systems  Constitutional: Positive for chills. Negative for fever.  HENT: Negative.   Respiratory: Negative for apnea and shortness of breath.   Cardiovascular: Negative for chest pain.  Gastrointestinal: Positive for abdominal pain, diarrhea and nausea. Negative for vomiting.  Genitourinary: Negative for decreased urine volume, difficulty urinating, dysuria and urgency.  Musculoskeletal: Positive for arthralgias.       Left shoulder and back pain  Neurological: Negative for syncope and headaches.  Psychiatric/Behavioral: Negative for confusion.     Physical Exam Updated Vital Signs BP 140/86   Pulse 87   Temp 98.7 F (37.1 C) (Oral)   Resp 20   Ht 5\' 7"  (1.702 m)   Wt 119.7 kg   SpO2 94%   BMI 41.35 kg/m   Physical Exam  Constitutional: She is oriented to person, place, and time. She appears well-developed and well-nourished. No distress.  HENT:  Head: Normocephalic and atraumatic.  Eyes: Conjunctivae and EOM are normal.  Neck: Neck supple.  Cardiovascular: Normal rate and regular rhythm.   Pulmonary/Chest: Effort normal and breath sounds normal. No respiratory distress.  Abdominal: Soft. Bowel sounds are normal. There is tenderness in the right lower quadrant. There is no rebound, no guarding and no CVA tenderness.  Musculoskeletal: She exhibits no edema.  Neurological: She is alert and oriented to person, place, and time.  Skin: Skin is warm and dry.  Psychiatric: She has a normal mood and affect. Her behavior is normal.  Nursing note and vitals reviewed.    ED Treatments / Results   DIAGNOSTIC STUDIES: Oxygen  Saturation is 94% on RA, adequate by my interpretation.    COORDINATION OF CARE: 6:36 PM Discussed treatment plan with pt at bedside which includes labs, EKG, Zofran and and pt agreed to plan.  Labs (all labs ordered are listed, but only abnormal results are displayed) Labs Reviewed  COMPREHENSIVE METABOLIC PANEL - Abnormal; Notable for the following:       Result Value   Glucose, Bld 124 (*)    Calcium 8.8 (*)    All other components within normal limits  CBC - Abnormal; Notable for the following:    WBC 14.3 (*)    All other components within normal limits  URINALYSIS, ROUTINE W REFLEX MICROSCOPIC (NOT AT [facility]) - Abnormal; Notable for the following:    Hgb urine dipstick TRACE (*)    All other components within normal limits  URINE MICROSCOPIC-ADD ON - Abnormal; Notable for the following:    Squamous Epithelial / LPF 0-5 (*)  Bacteria, UA FEW (*)    All other components within normal limits  DIFFERENTIAL - Abnormal; Notable for the following:    Neutro Abs 11.4 (*)    All other components within normal limits  LIPASE, BLOOD  TROPONIN I  HCG, QUANTITATIVE, PREGNANCY    EKG: Read by Dr. Lita Mains  Radiology Ct Abdomen Pelvis W Contrast  Result Date: 09/13/2016 CLINICAL DATA:  40 year old female with right lower quadrant abdominal pain EXAM: CT ABDOMEN AND PELVIS WITH CONTRAST TECHNIQUE: Multidetector CT imaging of the abdomen and pelvis was performed using the standard protocol following bolus administration of intravenous contrast. CONTRAST:  170mL ISOVUE-300 IOPAMIDOL (ISOVUE-300) INJECTION 61% COMPARISON:  CT dated 11/12/2015 FINDINGS: Lower chest: The visualized lung bases are clear. No intra-abdominal free air or free fluid. Hepatobiliary: Apparent mild fatty infiltration of the liver. No intrahepatic biliary ductal dilatation. The gallbladder is unremarkable. Pancreas: Unremarkable. No pancreatic ductal dilatation or surrounding inflammatory changes. Spleen: Normal in  size without focal abnormality. Adrenals/Urinary Tract: Adrenal glands are unremarkable. Kidneys are normal, without renal calculi, focal lesion, or hydronephrosis. Bladder is unremarkable. Stomach/Bowel: There is sigmoid diverticulosis without active inflammatory changes. No evidence of bowel obstruction. The appendix is minimally enlarged measures up to 9-10 mm in diameter. There is mild stranding of the periappendiceal fat concerning for an early acute appendicitis. No drainable fluid collection or abscess identified. The appendix is located in the right lower quadrant medial and posterior to the cecum. Vascular/Lymphatic: No significant vascular findings are present. No enlarged abdominal or pelvic lymph nodes. Reproductive: The uterus is anteverted. An intrauterine device is noted. A 2.5 x 3.2 cm partially calcified exophytic lesion along the posterior aspect of the lower uterus most compatible with a fibroid. The ovaries are grossly unremarkable. Other: Small fat containing umbilical hernia. Musculoskeletal: No acute osseous pathology. IMPRESSION: Findings most compatible with an early acute appendicitis. No abscess or evidence of perforation. Electronically Signed   By: Anner Crete M.D.   On: 09/13/2016 23:34    Procedures Procedures (including critical care time)  Medications Ordered in ED Medications  piperacillin-tazobactam (ZOSYN) IVPB 3.375 g (not administered)  ondansetron (ZOFRAN) injection 4 mg (4 mg Intravenous Given 09/13/16 1856)  iopamidol (ISOVUE-300) 61 % injection (100 mLs  Contrast Given 09/13/16 2149)     Initial Impression / Assessment and Plan / ED Course  I have reviewed the triage vital signs and the nursing notes.  Pertinent labs & imaging results that were available during my care of the patient were reviewed by me and considered in my medical decision making (see chart for details).  Clinical Course  Consult with Dr. Arnoldo Morale and he will accept the patient for  admission and plan for surgery this morning.  Final Clinical Impressions(s) / ED Diagnoses  39 y.o. female with right lower quadrant pain, nausea and shoulder pain stable for admission and continued care with Dr. Arnoldo Morale. Temporary admission orders written.  Final diagnoses:  Appendicitis, unspecified appendicitis type    New Prescriptions New Prescriptions   No medications on file   I personally performed the services described in this documentation, which was scribed in my presence. The recorded information has been reviewed and is accurate.     [address] [place], NP 09/14/16 0031    Julianne Rice, MD 09/18/16 [phone]

## 2016-09-13 NOTE — ED Triage Notes (Signed)
Patient states history of diverticulitis and complains of right lower abdominal pain that started last night at 1am. States pain radiates to left scapula. States pain started with diarrhea. States nausea. Denies Vomiting.

## 2016-09-13 NOTE — Telephone Encounter (Signed)
Tamalyn called me earlier with abdominal pain and she also had loose bowel movements. She felt like she had recurrent diverticulitis. Prescription was called into [facility] form actually MDS and metronidazole. Patient is being evaluated in emergency room for worsening pain.

## 2016-09-14 ENCOUNTER — Encounter (HOSPITAL_COMMUNITY): Payer: Self-pay

## 2016-09-14 ENCOUNTER — Observation Stay (HOSPITAL_COMMUNITY): Payer: 59 | Admitting: Anesthesiology

## 2016-09-14 ENCOUNTER — Encounter (HOSPITAL_COMMUNITY)
Admission: EM | Disposition: A | Discharge status: Home / Self Care | Payer: Self-pay | Source: Home / Self Care | Attending: Emergency Medicine

## 2016-09-14 DIAGNOSIS — K37 Unspecified appendicitis: Secondary | ICD-10-CM | POA: Diagnosis not present

## 2016-09-14 DIAGNOSIS — Z6841 Body Mass Index (BMI) 40.0 and over, adult: Secondary | ICD-10-CM | POA: Diagnosis not present

## 2016-09-14 DIAGNOSIS — K3580 Unspecified acute appendicitis: Secondary | ICD-10-CM | POA: Diagnosis present

## 2016-09-14 DIAGNOSIS — E669 Obesity, unspecified: Secondary | ICD-10-CM | POA: Diagnosis not present

## 2016-09-14 HISTORY — PX: LAPAROSCOPIC APPENDECTOMY: SHX408

## 2016-09-14 LAB — SURGICAL PCR SCREEN
MRSA, PCR: NEGATIVE
Staphylococcus aureus: NEGATIVE

## 2016-09-14 SURGERY — APPENDECTOMY, LAPAROSCOPIC
Anesthesia: General | Site: Abdomen

## 2016-09-14 MED ORDER — ARTIFICIAL TEARS OP OINT
TOPICAL_OINTMENT | OPHTHALMIC | Status: AC
  Filled 2016-09-14: qty 3.5

## 2016-09-14 MED ORDER — GLYCOPYRROLATE 0.2 MG/ML IJ SOLN
INTRAMUSCULAR | Status: AC
  Filled 2016-09-14: qty 1

## 2016-09-14 MED ORDER — HYDROMORPHONE HCL 1 MG/ML IJ SOLN: 1.0000 mg | INTRAMUSCULAR | Status: DC | PRN

## 2016-09-14 MED ORDER — POVIDONE-IODINE 10 % OINT PACKET
TOPICAL_OINTMENT | CUTANEOUS | Status: DC | PRN
  Administered 2016-09-14: 1 via TOPICAL

## 2016-09-14 MED ORDER — SUCCINYLCHOLINE CHLORIDE 20 MG/ML IJ SOLN
INTRAMUSCULAR | Status: AC
  Filled 2016-09-14: qty 1

## 2016-09-14 MED ORDER — ONDANSETRON HCL 4 MG/2ML IJ SOLN: 4.0000 mg | Freq: Four times a day (QID) | INTRAMUSCULAR | Status: DC | PRN

## 2016-09-14 MED ORDER — SUCCINYLCHOLINE CHLORIDE 20 MG/ML IJ SOLN
INTRAMUSCULAR | Status: DC | PRN
  Administered 2016-09-14: 140 mg via INTRAVENOUS

## 2016-09-14 MED ORDER — SIMETHICONE 80 MG PO CHEW
40.0000 mg | CHEWABLE_TABLET | Freq: Four times a day (QID) | ORAL | Status: DC | PRN
  Administered 2016-09-14: 40 mg via ORAL
  Filled 2016-09-14: qty 1

## 2016-09-14 MED ORDER — OXYCODONE-ACETAMINOPHEN 5-325 MG PO TABS
1.0000 | ORAL_TABLET | ORAL | Status: DC | PRN
  Administered 2016-09-14: 2 via ORAL
  Filled 2016-09-14: qty 2

## 2016-09-14 MED ORDER — ENOXAPARIN SODIUM 40 MG/0.4ML ~~LOC~~ SOLN: 40.0000 mg | SUBCUTANEOUS | Status: DC

## 2016-09-14 MED ORDER — KETOROLAC TROMETHAMINE 30 MG/ML IJ SOLN: 30.0000 mg | Freq: Once | INTRAMUSCULAR | Status: DC

## 2016-09-14 MED ORDER — CHLORHEXIDINE GLUCONATE CLOTH 2 % EX PADS
6.0000 | MEDICATED_PAD | Freq: Once | CUTANEOUS | Status: AC
  Administered 2016-09-14: 6 via TOPICAL

## 2016-09-14 MED ORDER — PIPERACILLIN-TAZOBACTAM 3.375 G IVPB
3.3750 g | Freq: Once | INTRAVENOUS | Status: AC
  Administered 2016-09-14: 3.375 g via INTRAVENOUS
  Filled 2016-09-14: qty 50

## 2016-09-14 MED ORDER — PROPOFOL 10 MG/ML IV BOLUS
INTRAVENOUS | Status: AC
  Filled 2016-09-14: qty 40

## 2016-09-14 MED ORDER — LACTATED RINGERS IV SOLN
INTRAVENOUS | Status: DC
  Administered 2016-09-14: 12:00:00 via INTRAVENOUS

## 2016-09-14 MED ORDER — DIPHENHYDRAMINE HCL 25 MG PO CAPS: 25.0000 mg | ORAL_CAPSULE | Freq: Four times a day (QID) | ORAL | Status: DC | PRN

## 2016-09-14 MED ORDER — ACETAMINOPHEN 325 MG PO TABS: 650.0000 mg | ORAL_TABLET | Freq: Four times a day (QID) | ORAL | Status: DC | PRN

## 2016-09-14 MED ORDER — MIDAZOLAM HCL 2 MG/2ML IJ SOLN
INTRAMUSCULAR | Status: AC
  Filled 2016-09-14: qty 2

## 2016-09-14 MED ORDER — KETOROLAC TROMETHAMINE 30 MG/ML IJ SOLN
INTRAMUSCULAR | Status: AC
  Filled 2016-09-14: qty 1

## 2016-09-14 MED ORDER — HYDROMORPHONE HCL 1 MG/ML IJ SOLN
1.0000 mg | INTRAMUSCULAR | Status: DC | PRN
  Administered 2016-09-14 (×2): 1 mg via INTRAVENOUS
  Administered 2016-09-14 (×2): 0.5 mg via INTRAVENOUS
  Filled 2016-09-14 (×3): qty 1

## 2016-09-14 MED ORDER — ONDANSETRON HCL 4 MG/2ML IJ SOLN
4.0000 mg | Freq: Once | INTRAMUSCULAR | Status: AC
  Administered 2016-09-14: 4 mg via INTRAVENOUS

## 2016-09-14 MED ORDER — PIPERACILLIN-TAZOBACTAM 3.375 G IVPB: 3.3750 g | Freq: Three times a day (TID) | INTRAVENOUS | Status: DC

## 2016-09-14 MED ORDER — ROCURONIUM BROMIDE 100 MG/10ML IV SOLN
INTRAVENOUS | Status: DC | PRN
  Administered 2016-09-14: 20 mg via INTRAVENOUS
  Administered 2016-09-14: 5 mg via INTRAVENOUS

## 2016-09-14 MED ORDER — DIPHENHYDRAMINE HCL 50 MG/ML IJ SOLN: 25.0000 mg | Freq: Four times a day (QID) | INTRAMUSCULAR | Status: DC | PRN

## 2016-09-14 MED ORDER — CHLORHEXIDINE GLUCONATE CLOTH 2 % EX PADS: 6.0000 | MEDICATED_PAD | Freq: Once | CUTANEOUS | Status: DC

## 2016-09-14 MED ORDER — ACETAMINOPHEN 650 MG RE SUPP: 650.0000 mg | Freq: Four times a day (QID) | RECTAL | Status: DC | PRN

## 2016-09-14 MED ORDER — OXYCODONE-ACETAMINOPHEN 7.5-325 MG PO TABS: 1.0000 | ORAL_TABLET | ORAL | 0 refills | Status: DC | PRN

## 2016-09-14 MED ORDER — LORAZEPAM 2 MG/ML IJ SOLN
1.0000 mg | INTRAMUSCULAR | Status: DC | PRN
Start: 2016-09-14 — End: 2016-09-14

## 2016-09-14 MED ORDER — FENTANYL CITRATE (PF) 250 MCG/5ML IJ SOLN
INTRAMUSCULAR | Status: AC
  Filled 2016-09-14: qty 5

## 2016-09-14 MED ORDER — PROPOFOL 10 MG/ML IV BOLUS
INTRAVENOUS | Status: DC | PRN
  Administered 2016-09-14: 180 mg via INTRAVENOUS
  Administered 2016-09-14: 20 mg via INTRAVENOUS

## 2016-09-14 MED ORDER — PIPERACILLIN-TAZOBACTAM 3.375 G IVPB
3.3750 g | Freq: Three times a day (TID) | INTRAVENOUS | Status: DC
  Administered 2016-09-14: 3.375 g via INTRAVENOUS
  Filled 2016-09-14: qty 50

## 2016-09-14 MED ORDER — GLYCOPYRROLATE 0.2 MG/ML IJ SOLN
INTRAMUSCULAR | Status: DC | PRN
  Administered 2016-09-14: 0.6 mg via INTRAVENOUS

## 2016-09-14 MED ORDER — MIDAZOLAM HCL 2 MG/2ML IJ SOLN
1.0000 mg | INTRAMUSCULAR | Status: DC | PRN
  Administered 2016-09-14 (×2): 2 mg via INTRAVENOUS
  Filled 2016-09-14: qty 2

## 2016-09-14 MED ORDER — POVIDONE-IODINE 10 % EX OINT
TOPICAL_OINTMENT | CUTANEOUS | Status: AC
  Filled 2016-09-14: qty 1

## 2016-09-14 MED ORDER — SODIUM CHLORIDE 0.9 % IV SOLN
INTRAVENOUS | Status: AC
  Administered 2016-09-14: 06:00:00 via INTRAVENOUS

## 2016-09-14 MED ORDER — DEXAMETHASONE SODIUM PHOSPHATE 4 MG/ML IJ SOLN
4.0000 mg | INTRAMUSCULAR | Status: AC
  Administered 2016-09-14: 4 mg via INTRAVENOUS

## 2016-09-14 MED ORDER — ONDANSETRON 4 MG PO TBDP: 4.0000 mg | ORAL_TABLET | Freq: Four times a day (QID) | ORAL | Status: DC | PRN

## 2016-09-14 MED ORDER — BUPIVACAINE HCL (PF) 0.5 % IJ SOLN
INTRAMUSCULAR | Status: DC | PRN
  Administered 2016-09-14: 10 mL

## 2016-09-14 MED ORDER — FENTANYL CITRATE (PF) 100 MCG/2ML IJ SOLN
INTRAMUSCULAR | Status: DC | PRN
  Administered 2016-09-14 (×3): 50 ug via INTRAVENOUS
  Administered 2016-09-14: 25 ug via INTRAVENOUS

## 2016-09-14 MED ORDER — GLYCOPYRROLATE 0.2 MG/ML IJ SOLN
INTRAMUSCULAR | Status: AC
  Filled 2016-09-14: qty 3

## 2016-09-14 MED ORDER — LACTATED RINGERS IV SOLN
INTRAVENOUS | Status: DC
  Administered 2016-09-14 (×2): via INTRAVENOUS

## 2016-09-14 MED ORDER — DEXAMETHASONE SODIUM PHOSPHATE 4 MG/ML IJ SOLN
INTRAMUSCULAR | Status: AC
  Filled 2016-09-14: qty 1

## 2016-09-14 MED ORDER — SODIUM CHLORIDE 0.9 % IV SOLN
INTRAVENOUS | Status: AC
  Administered 2016-09-14: 01:00:00 via INTRAVENOUS

## 2016-09-14 MED ORDER — LIDOCAINE HCL (CARDIAC) 10 MG/ML IV SOLN
INTRAVENOUS | Status: DC | PRN
  Administered 2016-09-14: 50 mg via INTRAVENOUS

## 2016-09-14 MED ORDER — 0.9 % SODIUM CHLORIDE (POUR BTL) OPTIME
TOPICAL | Status: DC | PRN
  Administered 2016-09-14: 1000 mL

## 2016-09-14 MED ORDER — HYDROMORPHONE HCL 1 MG/ML IJ SOLN
INTRAMUSCULAR | Status: AC
  Filled 2016-09-14: qty 0.5

## 2016-09-14 MED ORDER — NEOSTIGMINE METHYLSULFATE 10 MG/10ML IV SOLN
INTRAVENOUS | Status: DC | PRN
  Administered 2016-09-14: 3.5 mg via INTRAVENOUS

## 2016-09-14 MED ORDER — BUPIVACAINE HCL (PF) 0.5 % IJ SOLN
INTRAMUSCULAR | Status: AC
  Filled 2016-09-14: qty 30

## 2016-09-14 MED ORDER — HYDROMORPHONE HCL 1 MG/ML IJ SOLN: 0.2500 mg | INTRAMUSCULAR | Status: DC | PRN

## 2016-09-14 MED ORDER — ONDANSETRON HCL 4 MG/2ML IJ SOLN
4.0000 mg | Freq: Three times a day (TID) | INTRAMUSCULAR | Status: DC | PRN
  Administered 2016-09-14: 4 mg via INTRAVENOUS
  Filled 2016-09-14 (×2): qty 2

## 2016-09-14 MED ORDER — GLYCOPYRROLATE 0.2 MG/ML IJ SOLN
0.2000 mg | Freq: Once | INTRAMUSCULAR | Status: AC | PRN
  Administered 2016-09-14: 0.2 mg via INTRAVENOUS

## 2016-09-14 SURGICAL SUPPLY — 42 items
BAG HAMPER (MISCELLANEOUS) ×3 IMPLANT
BAG SPEC RTRVL LRG 6X4 10 (ENDOMECHANICALS) ×1
CHLORAPREP W/TINT 26ML (MISCELLANEOUS) ×3 IMPLANT
CLOTH BEACON ORANGE TIMEOUT ST (SAFETY) ×3 IMPLANT
COVER LIGHT HANDLE STERIS (MISCELLANEOUS) ×6 IMPLANT
CUTTER FLEX LINEAR 45M (STAPLE) ×2 IMPLANT
DECANTER SPIKE VIAL GLASS SM (MISCELLANEOUS) ×3 IMPLANT
ELECT REM PT RETURN 9FT ADLT (ELECTROSURGICAL) ×3
ELECTRODE REM PT RTRN 9FT ADLT (ELECTROSURGICAL) ×1 IMPLANT
EVACUATOR SMOKE 8.L (FILTER) ×3 IMPLANT
FORMALIN 10 PREFIL 120ML (MISCELLANEOUS) ×3 IMPLANT
GLOVE BIOGEL PI IND STRL 7.0 (GLOVE) ×2 IMPLANT
GLOVE BIOGEL PI INDICATOR 7.0 (GLOVE) ×6
GLOVE SURG SS PI 7.5 STRL IVOR (GLOVE) ×3 IMPLANT
GOWN STRL REUS W/ TWL XL LVL3 (GOWN DISPOSABLE) ×1 IMPLANT
GOWN STRL REUS W/TWL LRG LVL3 (GOWN DISPOSABLE) ×5 IMPLANT
GOWN STRL REUS W/TWL XL LVL3 (GOWN DISPOSABLE) ×3
INST SET LAPROSCOPIC AP (KITS) ×3 IMPLANT
KIT ROOM TURNOVER APOR (KITS) ×3 IMPLANT
MANIFOLD NEPTUNE II (INSTRUMENTS) ×3 IMPLANT
NDL INSUFFLATION 14GA 120MM (NEEDLE) ×1 IMPLANT
NEEDLE INSUFFLATION 14GA 120MM (NEEDLE) ×3 IMPLANT
NS IRRIG 1000ML POUR BTL (IV SOLUTION) ×3 IMPLANT
PACK LAP CHOLE LZT030E (CUSTOM PROCEDURE TRAY) ×3 IMPLANT
PAD ARMBOARD 7.5X6 YLW CONV (MISCELLANEOUS) ×3 IMPLANT
POUCH SPECIMEN RETRIEVAL 10MM (ENDOMECHANICALS) ×3 IMPLANT
RELOAD 45 VASCULAR/THIN (ENDOMECHANICALS) ×3 IMPLANT
RELOAD STAPLE 45 2.5 WHT GRN (ENDOMECHANICALS) IMPLANT
SET BASIN LINEN APH (SET/KITS/TRAYS/PACK) ×3 IMPLANT
SHEARS HARMONIC ACE PLUS 36CM (ENDOMECHANICALS) ×3 IMPLANT
SPONGE GAUZE 2X2 8PLY STER LF (GAUZE/BANDAGES/DRESSINGS) ×1
SPONGE GAUZE 2X2 8PLY STRL LF (GAUZE/BANDAGES/DRESSINGS) ×4 IMPLANT
STAPLER VISISTAT (STAPLE) ×3 IMPLANT
SUT VICRYL 0 UR6 27IN ABS (SUTURE) ×3 IMPLANT
TAPE CLOTH SURG 4X10 WHT LF (GAUZE/BANDAGES/DRESSINGS) ×2 IMPLANT
TRAY FOLEY CATH SILVER 16FR (SET/KITS/TRAYS/PACK) ×3 IMPLANT
TROCAR ENDO BLADELESS 11MM (ENDOMECHANICALS) ×3 IMPLANT
TROCAR ENDO BLADELESS 12MM (ENDOMECHANICALS) ×3 IMPLANT
TROCAR XCEL NON-BLD 5MMX100MML (ENDOMECHANICALS) ×3 IMPLANT
TUBING INSUFFLATION (TUBING) ×3 IMPLANT
WARMER LAPAROSCOPE (MISCELLANEOUS) ×3 IMPLANT
YANKAUER SUCT 12FT TUBE ARGYLE (SUCTIONS) ×3 IMPLANT

## 2016-09-14 NOTE — H&P (Signed)
Cynthia Ochoa is an 40 y.o. female.   Chief Complaint: Right lower quadrant abdominal pain HPI: Patient is a 40 year old white female who started having diarrhea and nonspecific abdominal pain 2 nights ago. She also was having some left shoulder pain. She was started on antibiotics for presumed possible diverticulitis. The diarrhea resolved but her pain worsened. She presented the emergency room with right lower quadrant abdominal pain. CT scan of the abdomen revealed early acute appendicitis.  Past Medical History:  Diagnosis Date  . Abnormal Pap smear   . Diverticulitis   . Fibroid   . Hx of campylobacteriosis 2005  . Hx of Salmonella gastroenteritis   . IBS (irritable bowel syndrome)     Past Surgical History:  Procedure Laterality Date  . CERVICAL CONIZATION W/BX N/A 09/07/2013   Procedure: CONIZATION CERVIX WITH BIOPSY/REMOVAL OF IUD AND INSERTION;  Surgeon: Cyril Mourning, MD;  Location: [facility];  Service: Gynecology;  Laterality: N/A;  . COLONOSCOPY  2005 NUR   I/E HEMORRHOIDS, TIC AT HF, NL TI  . COLONOSCOPY N/A 12/27/2015   Procedure: COLONOSCOPY;  Surgeon: Danie Binder, MD;  Location: [facility];  Service: Endoscopy;  Laterality: N/A;  0830  . COLPOSCOPY    . CYSTECTOMY     abdomen  . GYNECOLOGIC CRYOSURGERY    . VAGINAL DELIVERY  04/07/13  . WISDOM TOOTH EXTRACTION      Family History  Problem Relation Age of Onset  . Hypertension Mother   . Hypertension Father   . Melanoma Paternal Uncle   . Prader-Willi syndrome Brother   . Diabetes Brother   . Hypertension Brother   . COPD Maternal Grandfather   . COPD Paternal Grandmother    Social History:  reports that she has never smoked. She has never used smokeless tobacco. She reports that she does not drink alcohol or use drugs.  Allergies:  Allergies  Allergen Reactions  . Ciprofloxacin Other (See Comments)    Nerve pain.  Marland Kitchen Morphine And Related Other (See Comments)    CHEST PRESSURE  . Adhesive [Tape]  Rash    Medications Prior to Admission  Medication Sig Dispense Refill  . metroNIDAZOLE (FLAGYL) 500 MG tablet Take 500 mg by mouth 3 (three) times daily. 14 day course starting on 09/13/2016    . naproxen sodium (TH NAPROXEN) 220 MG tablet Take 440 mg by mouth once as needed (for pain).    Marland Kitchen oxyCODONE-acetaminophen (PERCOCET/ROXICET) 5-325 MG tablet Take 1 tablet by mouth once as needed for moderate pain or severe pain.    Marland Kitchen sulfamethoxazole-trimethoprim (BACTRIM DS,SEPTRA DS) 800-160 MG tablet Take 1 tablet by mouth 2 (two) times daily. 14 day course starting on 09/13/2016      Results for orders placed or performed during the hospital encounter of 09/13/16 (from the past 48 hour(s))  Urinalysis, Routine w reflex microscopic     Status: Abnormal   Collection Time: 09/13/16  3:50 PM  Result Value Ref Range   Color, Urine YELLOW YELLOW   APPearance CLEAR CLEAR   Specific Gravity, Urine 1.010 1.005 - 1.030   pH 5.5 5.0 - 8.0   Glucose, UA NEGATIVE NEGATIVE mg/dL   Hgb urine dipstick TRACE (A) NEGATIVE   Bilirubin Urine NEGATIVE NEGATIVE   Ketones, ur NEGATIVE NEGATIVE mg/dL   Protein, ur NEGATIVE NEGATIVE mg/dL   Nitrite NEGATIVE NEGATIVE   Leukocytes, UA NEGATIVE NEGATIVE  Urine microscopic-add on     Status: Abnormal   Collection Time: 09/13/16  3:50  PM  Result Value Ref Range   Squamous Epithelial / LPF 0-5 (A) NONE SEEN   WBC, UA 0-5 0 - 5 WBC/hpf   RBC / HPF 0-5 0 - 5 RBC/hpf   Bacteria, UA FEW (A) NONE SEEN   Urine-Other MUCOUS PRESENT   Lipase, blood     Status: None   Collection Time: 09/13/16  4:20 PM  Result Value Ref Range   Lipase 16 11 - 51 U/L  Comprehensive metabolic panel     Status: Abnormal   Collection Time: 09/13/16  4:20 PM  Result Value Ref Range   Sodium 138 135 - 145 mmol/L   Potassium 3.7 3.5 - 5.1 mmol/L   Chloride 107 101 - 111 mmol/L   CO2 24 22 - 32 mmol/L   Glucose, Bld 124 (H) 65 - 99 mg/dL   BUN 6 6 - 20 mg/dL   Creatinine, Ser 0.75 0.44 -  1.00 mg/dL   Calcium 8.8 (L) 8.9 - 10.3 mg/dL   Total Protein 7.0 6.5 - 8.1 g/dL   Albumin 4.1 3.5 - 5.0 g/dL   AST 19 15 - 41 U/L   ALT 19 14 - 54 U/L   Alkaline Phosphatase 65 38 - 126 U/L   Total Bilirubin 0.6 0.3 - 1.2 mg/dL   GFR calc non Af Amer >60 >60 mL/min   GFR calc Af Amer >60 >60 mL/min    Comment: (NOTE) The eGFR has been calculated using the CKD EPI equation. This calculation has not been validated in all clinical situations. eGFR's persistently <60 mL/min signify possible Chronic Kidney Disease.    Anion gap 7 5 - 15  CBC     Status: Abnormal   Collection Time: 09/13/16  4:20 PM  Result Value Ref Range   WBC 14.3 (H) 4.0 - 10.5 K/uL   RBC 4.92 3.87 - 5.11 MIL/uL   Hemoglobin 14.6 12.0 - 15.0 g/dL   HCT 42.2 36.0 - 46.0 %   MCV 85.8 78.0 - 100.0 fL   MCH 29.7 26.0 - 34.0 pg   MCHC 34.6 30.0 - 36.0 g/dL   RDW 13.3 11.5 - 15.5 %   Platelets 205 150 - 400 K/uL  Differential     Status: Abnormal   Collection Time: 09/13/16  4:20 PM  Result Value Ref Range   Neutrophils Relative % 79 %   Neutro Abs 11.4 (H) 1.7 - 7.7 K/uL   Lymphocytes Relative 14 %   Lymphs Abs 2.0 0.7 - 4.0 K/uL   Monocytes Relative 6 %   Monocytes Absolute 0.9 0.1 - 1.0 K/uL   Eosinophils Relative 1 %   Eosinophils Absolute 0.2 0.0 - 0.7 K/uL   Basophils Relative 0 %   Basophils Absolute 0.0 0.0 - 0.1 K/uL  Troponin I     Status: None   Collection Time: 09/13/16  7:13 PM  Result Value Ref Range   Troponin I <0.03 <0.03 ng/mL  hCG, quantitative, pregnancy     Status: None   Collection Time: 09/13/16  7:13 PM  Result Value Ref Range   hCG, Beta Chain, Quant, S <1 <5 mIU/mL    Comment:          GEST. AGE      CONC.  (mIU/mL)   <=1 WEEK        5 - 50     2 WEEKS       50 - 500     3 WEEKS  100 - 10,000     4 WEEKS     1,000 - 30,000     5 WEEKS     3,500 - 115,000   6-8 WEEKS     12,000 - 270,000    12 WEEKS     15,000 - 220,000        FEMALE AND NON-PREGNANT FEMALE:     LESS  THAN 5 mIU/mL   Surgical pcr screen     Status: None   Collection Time: 09/14/16  1:04 AM  Result Value Ref Range   MRSA, PCR NEGATIVE NEGATIVE   Staphylococcus aureus NEGATIVE NEGATIVE    Comment:        The Xpert SA Assay (FDA approved for NASAL specimens in patients over 72 years of age), is one component of a comprehensive surveillance program.  Test performance has been validated by [facility] for patients greater than or equal to 36 year old. It is not intended to diagnose infection nor to guide or monitor treatment.    Ct Abdomen Pelvis W Contrast  Result Date: 09/13/2016 CLINICAL DATA:  40 year old female with right lower quadrant abdominal pain EXAM: CT ABDOMEN AND PELVIS WITH CONTRAST TECHNIQUE: Multidetector CT imaging of the abdomen and pelvis was performed using the standard protocol following bolus administration of intravenous contrast. CONTRAST:  129m ISOVUE-300 IOPAMIDOL (ISOVUE-300) INJECTION 61% COMPARISON:  CT dated 11/12/2015 FINDINGS: Lower chest: The visualized lung bases are clear. No intra-abdominal free air or free fluid. Hepatobiliary: Apparent mild fatty infiltration of the liver. No intrahepatic biliary ductal dilatation. The gallbladder is unremarkable. Pancreas: Unremarkable. No pancreatic ductal dilatation or surrounding inflammatory changes. Spleen: Normal in size without focal abnormality. Adrenals/Urinary Tract: Adrenal glands are unremarkable. Kidneys are normal, without renal calculi, focal lesion, or hydronephrosis. Bladder is unremarkable. Stomach/Bowel: There is sigmoid diverticulosis without active inflammatory changes. No evidence of bowel obstruction. The appendix is minimally enlarged measures up to 9-10 mm in diameter. There is mild stranding of the periappendiceal fat concerning for an early acute appendicitis. No drainable fluid collection or abscess identified. The appendix is located in the right lower quadrant medial and posterior to the  cecum. Vascular/Lymphatic: No significant vascular findings are present. No enlarged abdominal or pelvic lymph nodes. Reproductive: The uterus is anteverted. An intrauterine device is noted. A 2.5 x 3.2 cm partially calcified exophytic lesion along the posterior aspect of the lower uterus most compatible with a fibroid. The ovaries are grossly unremarkable. Other: Small fat containing umbilical hernia. Musculoskeletal: No acute osseous pathology. IMPRESSION: Findings most compatible with an early acute appendicitis. No abscess or evidence of perforation. Electronically Signed   By: AAnner CreteM.D.   On: 09/13/2016 23:34    Review of Systems  Constitutional: Positive for malaise/fatigue. Negative for fever.  Eyes: Negative.   Respiratory: Negative.   Gastrointestinal: Positive for abdominal pain, diarrhea and nausea.  Genitourinary: Negative.   Musculoskeletal:       Left shoulder pain  Skin: Negative.   Neurological: Negative.   Endo/Heme/Allergies: Negative.   Psychiatric/Behavioral: Negative.     Blood pressure 119/73, pulse 93, temperature 98.6 F (37 C), temperature source Oral, resp. rate 18, height _0  (1.702 m), weight 118.5 kg (261 lb 3.2 oz), SpO2 98 %. Physical Exam  Constitutional: She is oriented to person, place, and time. She appears well-developed and well-nourished.  HENT:  Head: Normocephalic and atraumatic.  Neck: Normal range of motion. Neck supple.  Cardiovascular: Normal rate, regular rhythm and normal heart sounds.  Respiratory: Effort normal and breath sounds normal.  GI: Soft. She exhibits no distension. There is tenderness. There is no rebound.  Tender in right lower quadrant to palpation. No rigidity noted.  Neurological: She is alert and oriented to person, place, and time.  Skin: Skin is warm and dry.     Assessment/Plan Impression: Acute appendicitis Plan: Patient will be taken to the operating room for laparoscopic appendectomy. The risks and  benefits of the procedure including bleeding, infection, and the possibility of an open procedure were fully explained to the patient, who gave informed consent.  Jamesetta So, MD 09/14/2016, 7:30 AM

## 2016-09-14 NOTE — Progress Notes (Signed)
Patient discharged home with belongings, prescriptions, and IV removed and site intact.

## 2016-09-14 NOTE — Op Note (Signed)
Patient:  Cynthia Ochoa  DOB:  Oct 08, 1976  MRN:  [id number]   Preop Diagnosis:  Acute appendicitis  Postop Diagnosis:  Same  Procedure:  Laparoscopic appendectomy  Surgeon:  Aviva Signs, M.D.  Anes:  Gen. endotracheal  Indications:  Patient is a 40 year old white female who presents with a less than 48 hour history of worsening right lower quadrant abdominal pain. CT scan the abdomen reveals acute appendicitis. The risks and benefits of the procedure including bleeding, infection, and the possibility of an open procedure were fully explained to the patient, who gave informed consent.  Procedure note:  The patient was placed the supine position. After induction of general endotracheal anesthesia, the abdomen was prepped and draped using the usual sterile technique with DuraPrep. Surgical site confirmation was performed.  A supraumbilical incision was made down to the fascia. A Veress needle was introduced into the abdominal cavity and confirmation of placement was done using the saline drop test. The abdomen was then insufflated to 16 mmHg pressure. An 11 mm trocar was introduced into the abdominal cavity under direct visualization without difficulty. The patient was placed in deeper Trendelenburg position and an additional 12 mm trocar was placed the suprapubic region and a 5 mm trocar was placed left lower quadrant region. The appendix was visualized and noted to be acutely inflamed in its distal half. The mesoappendix was divided using the harmonic scalpel. A vascular Endo GIA was placed across the base the appendix and fired. The appendix was then removed using an Endo Catch bag without difficulty. The staple line was inspected and noted to be within normal limits. All fluid and air were then evacuated from the abdominal cavity prior to removal of the trochars.  All wounds were irrigated with normal saline. All wounds were injected with 0.5% Sensorcaine. The supraumbilical fascia was  reapproximated using an 0 Vicryl interrupted suture. All skin incisions were closed using staples. Betadine ointment and dry sterile dressings were applied.  All tape and needle counts were correct at the end of the procedure. Patient was extubated in the operating room and transferred to PACU in stable condition.  Complications:  None  EBL:  Minimal  Specimen:  Appendix

## 2016-09-14 NOTE — Anesthesia Procedure Notes (Signed)
Procedure Name: Intubation Date/Time: 09/14/2016 9:21 AM Performed by: Vista Deck Pre-anesthesia Checklist: Patient identified, Emergency Drugs available, Suction available, Patient being monitored and Timeout performed Patient Re-evaluated:Patient Re-evaluated prior to inductionOxygen Delivery Method: Circle system utilized Preoxygenation: Pre-oxygenation with 100% oxygen Intubation Type: IV induction, Cricoid Pressure applied and Rapid sequence Laryngoscope Size: Glidescope and 3 (lopro) Grade View: Grade I Tube type: Oral Tube size: 7.0 mm Number of attempts: 1 Airway Equipment and Method: Video-laryngoscopy,  Stylet and Oral airway Placement Confirmation: ETT inserted through vocal cords under direct vision,  positive ETCO2 and breath sounds checked- equal and bilateral Secured at: 20 cm Tube secured with: Tape Dental Injury: Teeth and Oropharynx as per pre-operative assessment

## 2016-09-14 NOTE — Discharge Instructions (Signed)
Laparoscopic Appendectomy, Adult, Care After °Refer to this sheet in the next few weeks. These instructions provide you with information on caring for yourself after your procedure. Your caregiver may also give you more specific instructions. Your treatment has been planned according to current medical practices, but problems sometimes occur. Call your caregiver if you have any problems or questions after your procedure. °HOME CARE INSTRUCTIONS °· Do not drive while taking narcotic pain medicines. °· Use stool softener if you become constipated from your pain medicines. °· Change your bandages (dressings) as directed. °· Keep your wounds clean and dry. You may wash the wounds gently with soap and water. Gently pat the wounds dry with a clean towel. °· Do not take baths, swim, or use hot tubs for 10 days, or as instructed by your caregiver. °· Only take over-the-counter or prescription medicines for pain, discomfort, or fever as directed by your caregiver. °· You may continue your normal diet as directed. °· Do not lift more than 10 pounds (4.5 kg) or play contact sports for 3 weeks, or as directed. °· Slowly increase your activity after surgery. °· Take deep breaths to avoid getting a lung infection (pneumonia). °SEEK MEDICAL CARE IF: °· You have redness, swelling, or increasing pain in your wounds. °· You have pus coming from your wounds. °· You have drainage from a wound that lasts longer than 1 day. °· You notice a bad smell coming from the wounds or dressing. °· Your wound edges break open after stitches (sutures) have been removed. °· You notice increasing pain in the shoulders (shoulder strap areas) or near your shoulder blades. °· You develop dizzy episodes or fainting while standing. °· You develop shortness of breath. °· You develop persistent nausea or vomiting. °· You cannot control your bowel functions or lose your appetite. °· You develop diarrhea. °SEEK IMMEDIATE MEDICAL CARE IF:  °· You have a  fever. °· You develop a rash. °· You have difficulty breathing or sharp pains in your chest. °· You develop any reaction or side effects to medicines given. °MAKE SURE YOU: °· Understand these instructions. °· Will watch your condition. °· Will get help right away if you are not doing well or get worse. °  °This information is not intended to replace advice given to you by your health care provider. Make sure you discuss any questions you have with your health care provider. °  °Document Released: 11/09/2005 Document Revised: 03/26/2015 Document Reviewed: 04/29/2015 °Elsevier Interactive Patient Education ©2016 [employer] ° °

## 2016-09-14 NOTE — Anesthesia Postprocedure Evaluation (Signed)
Anesthesia Post Note  Patient: Cynthia Ochoa  Procedure(s) Performed: Procedure(s) (LRB): APPENDECTOMY LAPAROSCOPIC (N/A)  Patient location during evaluation: PACU Anesthesia Type: General Level of consciousness: awake Pain management: satisfactory to patient Vital Signs Assessment: post-procedure vital signs reviewed and stable Respiratory status: spontaneous breathing Cardiovascular status: stable Anesthetic complications: no    Last Vitals:  Vitals:   09/14/16 1045 09/14/16 1054  BP: 127/72 124/71  Pulse: 94 89  Resp: 17 16  Temp:      Last Pain:  Vitals:   09/14/16 1045  TempSrc:   PainSc: 4                  Kyandra Mcclaine

## 2016-09-14 NOTE — Transfer of Care (Signed)
Immediate Anesthesia Transfer of Care Note  Patient: Cynthia Ochoa  Procedure(s) Performed: Procedure(s): APPENDECTOMY LAPAROSCOPIC (N/A)  Patient Location: PACU  Anesthesia Type:General  Level of Consciousness: sedated  Airway & Oxygen Therapy: Patient Spontanous Breathing and non-rebreather face mask  Post-op Assessment: Report given to RN and Post -op Vital signs reviewed and stable  Post vital signs: Reviewed  Last Vitals:  Vitals:   09/14/16 0830 09/14/16 0835  BP: 127/72 131/84  Pulse:    Resp: (!) 37 14  Temp:      Last Pain:  Vitals:   09/14/16 0636  TempSrc: Oral  PainSc: 1          Complications: No apparent anesthesia complications

## 2016-09-14 NOTE — Anesthesia Preprocedure Evaluation (Signed)
Anesthesia Evaluation  Patient identified by MRN, date of birth, ID band Patient awake    Reviewed: Allergy & Precautions, H&P , NPO status , Patient's Chart, lab work & pertinent test results  History of Anesthesia Complications Negative for: history of anesthetic complications  Airway Mallampati: II  TM Distance: >3 FB Neck ROM: full    Dental  (+) Teeth Intact   Pulmonary neg pulmonary ROS,    Pulmonary exam normal breath sounds clear to auscultation       Cardiovascular Exercise Tolerance: Good negative cardio ROS   Rhythm:regular Rate:Normal     Neuro/Psych negative neurological ROS  negative psych ROS   GI/Hepatic Neg liver ROS,   Endo/Other    Renal/GU negative Renal ROS     Musculoskeletal   Abdominal   Peds  Hematology   Anesthesia Other Findings   Reproductive/Obstetrics                             Anesthesia Physical Anesthesia Plan  ASA: II  Anesthesia Plan: General   Post-op Pain Management:    Induction: Intravenous, Rapid sequence and Cricoid pressure planned  Airway Management Planned: Oral ETT  Additional Equipment:   Intra-op Plan:   Post-operative Plan: Extubation in OR  Informed Consent: I have reviewed the patients History and Physical, chart, labs and discussed the procedure including the risks, benefits and alternatives for the proposed anesthesia with the patient or authorized representative who has indicated his/her understanding and acceptance.     Plan Discussed with:   Anesthesia Plan Comments:         Anesthesia Quick Evaluation

## 2016-09-14 NOTE — ED Notes (Signed)
Report given to Kristi RN on 300 

## 2016-09-17 ENCOUNTER — Encounter (HOSPITAL_COMMUNITY): Payer: Self-pay | Admitting: General Surgery

## 2016-09-21 NOTE — Discharge Summary (Signed)
Physician Discharge Summary  Patient ID: Cynthia Ochoa MRN: [id number] DOB/AGE: November 28, 1975 40 y.o.  Admit date: 09/13/2016 Discharge date: 09/14/2016  Admission Diagnoses:Acute appendicitis  Discharge Diagnoses: Same Active Problems:   Appendicitis, acute   Discharged Condition: good  Hospital Course: Patient is a 40 year old white female who presented to the emergency room with a 24-hour history of worsening lower abdominal pain. Initially started on the left side of the abdomen but then radiated to the right lower quadrant. CT scan the abdomen revealed acute appendicitis. The patient was admitted to the hospital under observation status for IV antibiotic and pain control. She stopped school he underwent laparoscopic appendectomy on 09/14/2016. She tolerated the procedure well. Her postoperative course was unremarkable. Her diet was advanced without difficulty. The patient was discharged home on 09/14/2016 in good and improving condition.  Treatments: surgery: Laparoscopic appendectomy on 09/14/2016  Discharge Exam: Blood pressure (!) 114/58, pulse 86, temperature 97.8 F (36.6 C), resp. rate 16, height 5\' 7"  (1.702 m), weight 118.5 kg (261 lb 3.2 oz), SpO2 94 %. General appearance: alert, cooperative and no distress Resp: clear to auscultation bilaterally Cardio: regular rate and rhythm, S1, S2 normal, no murmur, click, rub or gallop GI: Soft, dressings dry and intact.  Disposition: 01-Home or Self Care     Medication List    STOP taking these medications   oxyCODONE-acetaminophen 5-325 MG tablet Commonly known as:  PERCOCET/ROXICET Replaced by:  oxyCODONE-acetaminophen 7.5-325 MG tablet     TAKE these medications   metroNIDAZOLE 500 MG tablet Commonly known as:  FLAGYL Take 500 mg by mouth 3 (three) times daily. 14 day course starting on 09/13/2016   oxyCODONE-acetaminophen 7.5-325 MG tablet Commonly known as:  PERCOCET Take 1-2 tablets by mouth every 4 (four)  hours as needed. Replaces:  oxyCODONE-acetaminophen 5-325 MG tablet   sulfamethoxazole-trimethoprim 800-160 MG tablet Commonly known as:  BACTRIM DS,SEPTRA DS Take 1 tablet by mouth 2 (two) times daily. 14 day course starting on 09/13/2016   TH NAPROXEN 220 MG tablet Generic drug:  naproxen sodium Take 440 mg by mouth once as needed (for pain).      Follow-up Information    Jamesetta So, MD. Schedule an appointment as soon as possible for a visit on 09/22/2016.   Specialty:  General Surgery Why:  Tuesday, October 31st at 11:00am. Contact information: [address] [phone]           Signed: Aviva Signs A 09/21/2016, 8:36 AM

## 2016-11-30 MED FILL — DIETHYLPROPION 75 MG TAB ER: 75 | 30 days supply | Qty: 30 | Fill #1

## 2017-01-08 ENCOUNTER — Telehealth: Payer: 59 | Admitting: Family

## 2017-01-08 DIAGNOSIS — J028 Acute pharyngitis due to other specified organisms: Secondary | ICD-10-CM

## 2017-01-08 DIAGNOSIS — B9689 Other specified bacterial agents as the cause of diseases classified elsewhere: Secondary | ICD-10-CM | POA: Diagnosis not present

## 2017-01-08 MED ORDER — BENZONATATE 100 MG PO CAPS: 100.0000 mg | ORAL_CAPSULE | Freq: Three times a day (TID) | ORAL | 0 refills | Status: DC | PRN

## 2017-01-08 MED ORDER — AZITHROMYCIN 250 MG PO TABS: ORAL_TABLET | ORAL | 0 refills | Status: DC

## 2017-01-08 NOTE — Progress Notes (Signed)

## 2017-01-08 NOTE — Addendum Note (Signed)
Addended by: Benjamine Mola on: 01/08/2017 07:09 PM   Modules accepted: Orders

## 2017-01-27 MED FILL — DIETHYLPROPION 75 MG TAB ER: 75 | 30 days supply | Qty: 30 | Fill #0

## 2017-02-05 DIAGNOSIS — Z6838 Body mass index (BMI) 38.0-38.9, adult: Secondary | ICD-10-CM | POA: Diagnosis not present

## 2017-02-05 DIAGNOSIS — F411 Generalized anxiety disorder: Secondary | ICD-10-CM | POA: Diagnosis not present

## 2017-03-08 DIAGNOSIS — F411 Generalized anxiety disorder: Secondary | ICD-10-CM | POA: Diagnosis not present

## 2017-03-08 MED FILL — FLUoxetine HCL 20 MG CAPS: 20 | 30 days supply | Qty: 30 | Fill #0

## 2017-03-09 MED FILL — ALPRAZolam 0.5 MG TABS: 0.5 | 30 days supply | Qty: 30 | Fill #0

## 2017-04-05 MED FILL — FLUoxetine HCL 20 MG CAPS: 20 | 30 days supply | Qty: 30 | Fill #1

## 2017-05-07 MED FILL — FLUoxetine HCL 20 MG CAPS: 20 | 30 days supply | Qty: 30 | Fill #2

## 2017-06-03 DIAGNOSIS — Z01419 Encounter for gynecological examination (general) (routine) without abnormal findings: Secondary | ICD-10-CM | POA: Diagnosis not present

## 2017-06-03 DIAGNOSIS — Z6841 Body Mass Index (BMI) 40.0 and over, adult: Secondary | ICD-10-CM | POA: Diagnosis not present

## 2017-06-03 MED FILL — FLUoxetine HCL 20 MG CAPS: 20 | 30 days supply | Qty: 30 | Fill #3

## 2017-06-04 MED FILL — DOXYCYCLINE HYCLATE 100 MG: 100 | 30 days supply | Qty: 30 | Fill #0

## 2017-06-04 MED FILL — PHENTERMINE 37.5 MG TABLET: 37.5 | 30 days supply | Qty: 30 | Fill #0

## 2017-06-04 MED FILL — CLINDAMYCIN PHOSP 1% LOTION: 1 | 30 days supply | Qty: 60 | Fill #0

## 2017-07-02 MED FILL — PHENTERMINE 37.5 MG TABLET: 37.5 | 30 days supply | Qty: 30 | Fill #1

## 2017-07-02 MED FILL — FLUoxetine HCL 20 MG CAPS: 20 | 30 days supply | Qty: 30 | Fill #4

## 2017-08-02 MED FILL — PHENTERMINE 37.5 MG TABLET: 37.5 | 30 days supply | Qty: 30 | Fill #2

## 2017-08-02 MED FILL — FLUoxetine HCL 20 MG CAPS: 20 | 30 days supply | Qty: 30 | Fill #5

## 2017-08-02 MED FILL — DOXYCYCLINE HYCLATE 100 MG: 100 | 30 days supply | Qty: 30 | Fill #1

## 2017-08-31 MED FILL — FLUoxetine HCL 20 MG CAPS: 20 | 30 days supply | Qty: 30 | Fill #0

## 2017-08-31 MED FILL — PHENTERMINE 37.5 MG TABLET: 37.5 | 30 days supply | Qty: 30 | Fill #3

## 2017-08-31 MED FILL — DOXYCYCLINE HYCLATE 100 MG: 100 | 30 days supply | Qty: 30 | Fill #2

## 2017-09-29 MED FILL — PHENTERMINE 37.5 MG TABLET: 37.5 | 30 days supply | Qty: 30 | Fill #0

## 2017-09-29 MED FILL — FLUoxetine HCL 20 MG CAPS: 20 | 30 days supply | Qty: 30 | Fill #1

## 2017-09-29 MED FILL — DOXYCYCLINE HYCLATE 100 MG: 100 | 30 days supply | Qty: 30 | Fill #3

## 2017-10-28 DIAGNOSIS — H52223 Regular astigmatism, bilateral: Secondary | ICD-10-CM | POA: Diagnosis not present

## 2017-10-28 DIAGNOSIS — H5213 Myopia, bilateral: Secondary | ICD-10-CM | POA: Diagnosis not present

## 2017-10-28 MED FILL — DOXYCYCLINE HYCLATE 100 MG: 100 | 30 days supply | Qty: 30 | Fill #4

## 2017-10-28 MED FILL — PHENTERMINE 37.5 MG TABLET: 37.5 | 30 days supply | Qty: 30 | Fill #1

## 2017-10-28 MED FILL — FLUoxetine HCL 20 MG CAPS: 20 | 30 days supply | Qty: 30 | Fill #2

## 2017-11-10 MED FILL — XIIDRA 5% EYE DROPS: 5 | 30 days supply | Qty: 60 | Fill #0

## 2017-11-29 MED FILL — PHENTERMINE 37.5 MG TABLET: 37.5 | 30 days supply | Qty: 30 | Fill #2

## 2017-11-29 MED FILL — FLUoxetine HCL 20 MG CAPS: 20 | 30 days supply | Qty: 30 | Fill #3

## 2017-11-29 MED FILL — DOXYCYCLINE HYCLATE 100 MG: 100 | 30 days supply | Qty: 30 | Fill #5

## 2017-12-29 MED FILL — FLUoxetine HCL 20 MG CAPS: 20 | 30 days supply | Qty: 30 | Fill #4

## 2017-12-29 MED FILL — PHENTERMINE 37.5 MG TABLET: 37.5 | 30 days supply | Qty: 30 | Fill #3

## 2017-12-29 MED FILL — DOXYCYCLINE HYCLATE 100 MG: 100 | 30 days supply | Qty: 30 | Fill #6

## 2018-01-24 MED FILL — FLUoxetine HCL 20 MG CAPS: 20 | 30 days supply | Qty: 30 | Fill #5

## 2018-01-24 MED FILL — DOXYCYCLINE HYCLATE 100 MG: 100 | 30 days supply | Qty: 30 | Fill #7

## 2018-02-28 MED FILL — DOXYCYCLINE HYCLATE 100 MG: 100 | 30 days supply | Qty: 30 | Fill #8

## 2018-03-04 DIAGNOSIS — F419 Anxiety disorder, unspecified: Secondary | ICD-10-CM | POA: Diagnosis not present

## 2018-03-04 DIAGNOSIS — Z6841 Body Mass Index (BMI) 40.0 and over, adult: Secondary | ICD-10-CM | POA: Diagnosis not present

## 2018-03-04 MED FILL — ESCITALOPRAM 10 MG TABLET: 10 | 30 days supply | Qty: 30 | Fill #0

## 2018-03-04 MED FILL — clonazePAM 0.5 MG TABS: 0.5 | 22 days supply | Qty: 45 | Fill #0

## 2018-03-10 DIAGNOSIS — Z30433 Encounter for removal and reinsertion of intrauterine contraceptive device: Secondary | ICD-10-CM | POA: Diagnosis not present

## 2018-03-29 MED FILL — DOXYCYCLINE HYCLATE 100 MG: 100 | 30 days supply | Qty: 30 | Fill #9

## 2018-03-29 MED FILL — ESCITALOPRAM 10 MG TABLET: 10 | 30 days supply | Qty: 30 | Fill #1

## 2018-04-07 DIAGNOSIS — Z6841 Body Mass Index (BMI) 40.0 and over, adult: Secondary | ICD-10-CM | POA: Diagnosis not present

## 2018-04-07 DIAGNOSIS — I10 Essential (primary) hypertension: Secondary | ICD-10-CM | POA: Diagnosis not present

## 2018-04-07 DIAGNOSIS — E782 Mixed hyperlipidemia: Secondary | ICD-10-CM | POA: Diagnosis not present

## 2018-04-11 DIAGNOSIS — E782 Mixed hyperlipidemia: Secondary | ICD-10-CM | POA: Diagnosis not present

## 2018-04-11 DIAGNOSIS — Z6841 Body Mass Index (BMI) 40.0 and over, adult: Secondary | ICD-10-CM | POA: Diagnosis not present

## 2018-04-11 DIAGNOSIS — F331 Major depressive disorder, recurrent, moderate: Secondary | ICD-10-CM | POA: Diagnosis not present

## 2018-04-11 DIAGNOSIS — Z0000 Encounter for general adult medical examination without abnormal findings: Secondary | ICD-10-CM | POA: Diagnosis not present

## 2018-04-20 DIAGNOSIS — Z30431 Encounter for routine checking of intrauterine contraceptive device: Secondary | ICD-10-CM | POA: Diagnosis not present

## 2018-04-27 MED FILL — ESCITALOPRAM 10 MG TABLET: 10 | 30 days supply | Qty: 30 | Fill #2

## 2018-04-27 MED FILL — DOXYCYCLINE HYCLATE 100 MG: 100 | 30 days supply | Qty: 30 | Fill #10

## 2018-05-23 MED FILL — DOXYCYCLINE HYCLATE 100 MG: 100 | 30 days supply | Qty: 30 | Fill #11

## 2018-05-23 MED FILL — ESCITALOPRAM 10 MG TABLET: 10 | 30 days supply | Qty: 30 | Fill #3

## 2018-06-06 DIAGNOSIS — Z01419 Encounter for gynecological examination (general) (routine) without abnormal findings: Secondary | ICD-10-CM | POA: Diagnosis not present

## 2018-06-06 DIAGNOSIS — Z6841 Body Mass Index (BMI) 40.0 and over, adult: Secondary | ICD-10-CM | POA: Diagnosis not present

## 2018-06-08 MED FILL — CLINDAMYCIN PH 1% GEL: 1 | 15 days supply | Qty: 30 | Fill #0

## 2018-06-08 MED FILL — DIETHYLPROPION ER 75 MG TAB: 75 | 30 days supply | Qty: 30 | Fill #0

## 2018-06-27 MED FILL — ESCITALOPRAM 10 MG TABLET: 10 | 30 days supply | Qty: 30 | Fill #4

## 2018-06-27 MED FILL — DOXYCYCLINE HYCLATE 100 MG: 100 | 90 days supply | Qty: 90 | Fill #0

## 2018-07-19 DIAGNOSIS — R350 Frequency of micturition: Secondary | ICD-10-CM | POA: Diagnosis not present

## 2018-07-19 DIAGNOSIS — R3121 Asymptomatic microscopic hematuria: Secondary | ICD-10-CM | POA: Diagnosis not present

## 2018-07-26 MED FILL — ESCITALOPRAM 10 MG TABLET: 10 | 90 days supply | Qty: 90 | Fill #0

## 2018-08-09 ENCOUNTER — Other Ambulatory Visit (HOSPITAL_COMMUNITY): Payer: Self-pay | Admitting: Obstetrics and Gynecology

## 2018-08-09 DIAGNOSIS — Z1231 Encounter for screening mammogram for malignant neoplasm of breast: Secondary | ICD-10-CM

## 2018-08-22 ENCOUNTER — Ambulatory Visit (HOSPITAL_COMMUNITY)
Admission: RE | Admit: 2018-08-22 | Discharge: 2018-08-22 | Disposition: A | Discharge status: Home / Self Care | Payer: 59 | Source: Ambulatory Visit | Attending: Obstetrics and Gynecology | Admitting: Obstetrics and Gynecology

## 2018-08-22 DIAGNOSIS — Z1231 Encounter for screening mammogram for malignant neoplasm of breast: Secondary | ICD-10-CM | POA: Diagnosis not present

## 2018-09-23 MED FILL — DIETHYLPROPION ER 75 MG TAB: 75 | 30 days supply | Qty: 30 | Fill #1

## 2018-10-24 MED FILL — ESCITALOPRAM 10 MG TABLET: 10 | 90 days supply | Qty: 90 | Fill #1

## 2018-10-28 ENCOUNTER — Ambulatory Visit (INDEPENDENT_AMBULATORY_CARE_PROVIDER_SITE_OTHER): Payer: Self-pay | Admitting: Obstetrics and Gynecology

## 2018-10-28 ENCOUNTER — Encounter: Payer: Self-pay | Admitting: Obstetrics and Gynecology

## 2018-10-28 VITALS — BP 140/100 | HR 89 | Temp 99.1°F | Resp 17 | Wt 274.4 lb

## 2018-10-28 DIAGNOSIS — R0989 Other specified symptoms and signs involving the circulatory and respiratory systems: Secondary | ICD-10-CM | POA: Insufficient documentation

## 2018-10-28 DIAGNOSIS — H6593 Unspecified nonsuppurative otitis media, bilateral: Secondary | ICD-10-CM

## 2018-10-28 DIAGNOSIS — J209 Acute bronchitis, unspecified: Secondary | ICD-10-CM

## 2018-10-28 DIAGNOSIS — R05 Cough: Secondary | ICD-10-CM

## 2018-10-28 DIAGNOSIS — R059 Cough, unspecified: Secondary | ICD-10-CM | POA: Insufficient documentation

## 2018-10-28 MED ORDER — PROMETHAZINE-DM 6.25-15 MG/5ML PO SYRP: 5.0000 mL | ORAL_SOLUTION | Freq: Four times a day (QID) | ORAL | 0 refills | Status: DC | PRN

## 2018-10-28 MED ORDER — AMOXICILLIN-POT CLAVULANATE 875-125 MG PO TABS: 1.0000 | ORAL_TABLET | Freq: Two times a day (BID) | ORAL | 0 refills | Status: DC

## 2018-10-28 MED ORDER — METHYLPREDNISOLONE SODIUM SUCC 125 MG IJ SOLR
125.0000 mg | Freq: Once | INTRAMUSCULAR | Status: AC
  Administered 2018-10-28: 125 mg via INTRAMUSCULAR

## 2018-10-28 MED ORDER — ALBUTEROL SULFATE 108 (90 BASE) MCG/ACT IN AEPB: 1.0000 | INHALATION_SPRAY | Freq: Four times a day (QID) | RESPIRATORY_TRACT | 0 refills | Status: DC | PRN

## 2018-10-28 MED FILL — PROMETHAZINE W/DM SYRUP: 6.25-15 | 9 days supply | Qty: 180 | Fill #0

## 2018-10-28 MED FILL — AMOX-CLAV 875-125 MG TABLET: 875-125 | 10 days supply | Qty: 20 | Fill #0

## 2018-10-28 MED FILL — PROAIR RESPICLICK INHAL PWD: 108 (90 BAS | 25 days supply | Qty: 1 | Fill #0

## 2018-10-28 NOTE — Progress Notes (Signed)
  Subjective:     Patient ID: Cynthia Ochoa, female   DOB: Jun 14, 1976, 42 y.o.   MRN: [id number]  HPI  Cynthia Ochoa is a 42 y.o. female here with URI symptoms that started last Friday. Says that her symptoms are getting worse. Says she cannot sleep because of the cough. The cough is non productive, however she feels like she needs to cough something up. She has tried mucinex, delsym, tessalon pearles, sudafed, and ibuprofen. Nothing is helping her symptoms. Says she has missed work because she feels so bad. No fever. + Hot and cold episodes. Non smoker. She is trying to rest however feels like she cannot get rest due to her aches and cough.   Review of Systems  Constitutional: Positive for chills. Negative for fever.  HENT: Positive for congestion. Negative for ear pain and sore throat.   Respiratory: Positive for cough. Negative for chest tightness.    Objective:   Physical Exam  Constitutional: She appears well-developed.  Non-toxic appearance. She has a sickly appearance. She appears ill. No distress.  HENT:  Right Ear: There is tenderness. Tympanic membrane is erythematous and bulging. A middle ear effusion is present.  Left Ear: There is tenderness. Tympanic membrane is erythematous and bulging. A middle ear effusion is present.  Mouth/Throat: Uvula is midline. Mucous membranes are pale and dry. Posterior oropharyngeal erythema present. No oropharyngeal exudate or tonsillar abscesses.  Pulmonary/Chest: Effort normal and breath sounds normal. No respiratory distress.   Blood pressure (!) 140/100, pulse 89, temperature 99.1 F (37.3 C), temperature source Oral, resp. rate 17, weight 274 lb 6.4 oz (124.5 kg), SpO2 97 %.  Assessment:   1. Bilateral otitis media with effusion   2. Cough   3. Chest congestion   4. Bronchitis, acute, with bronchospasm    Plan:   - 42 year old obese female with multiple health conditions; sickly appearance. Coughing frequently in office  today. Bilateral TM bulging with significant erythema. Mild low grade temp today.  - Solumedrol 125 mg IM in office today  - RX: Albuterol, Augmentin, Phenergan cough. Continue tessalon Pearles and OTC treatment.  - increase rest and hydration- stressed this.  - If symptoms do not start to improve in the next 48 hours patient should go to [facility] for further evaluation.   Lezlie Lye, NP 10/28/2018 9:48 AM

## 2018-12-09 ENCOUNTER — Ambulatory Visit (HOSPITAL_COMMUNITY)
Admission: RE | Admit: 2018-12-09 | Discharge: 2018-12-09 | Disposition: A | Discharge status: Home / Self Care | Payer: 59 | Source: Ambulatory Visit | Attending: General Surgery | Admitting: General Surgery

## 2018-12-09 ENCOUNTER — Other Ambulatory Visit: Payer: Self-pay | Admitting: General Surgery

## 2018-12-09 DIAGNOSIS — R059 Cough, unspecified: Secondary | ICD-10-CM

## 2018-12-09 DIAGNOSIS — R0602 Shortness of breath: Secondary | ICD-10-CM | POA: Diagnosis not present

## 2018-12-09 DIAGNOSIS — R05 Cough: Secondary | ICD-10-CM | POA: Diagnosis not present

## 2018-12-12 DIAGNOSIS — J060 Acute laryngopharyngitis: Secondary | ICD-10-CM | POA: Diagnosis not present

## 2018-12-12 MED FILL — HYDROCODONE-CHLORPHEN ER SU: 10-8 | 12 days supply | Qty: 120 | Fill #0

## 2018-12-12 MED FILL — clonazePAM 0.5 MG TABS: 0.5 | 22 days supply | Qty: 45 | Fill #0

## 2018-12-12 MED FILL — predniSONE 10 MG TABS: 10 | 6 days supply | Qty: 21 | Fill #0

## 2018-12-12 MED FILL — AZITHROMYCIN 250 MG TABLET: 250 | 5 days supply | Qty: 6 | Fill #0

## 2018-12-12 MED FILL — DIETHYLPROPION ER 75 MG TAB: 75 | 30 days supply | Qty: 30 | Fill #0

## 2019-01-09 DIAGNOSIS — F331 Major depressive disorder, recurrent, moderate: Secondary | ICD-10-CM | POA: Diagnosis not present

## 2019-01-09 DIAGNOSIS — F419 Anxiety disorder, unspecified: Secondary | ICD-10-CM | POA: Diagnosis not present

## 2019-01-09 DIAGNOSIS — Z0000 Encounter for general adult medical examination without abnormal findings: Secondary | ICD-10-CM | POA: Diagnosis not present

## 2019-01-09 MED FILL — buPROPion HCL ER (SR) 100 M: 100 | 30 days supply | Qty: 30 | Fill #0

## 2019-01-09 MED FILL — ALPRAZolam 0.5 MG TABS: 0.5 | 30 days supply | Qty: 30 | Fill #0

## 2019-01-12 ENCOUNTER — Ambulatory Visit
Admission: EM | Admit: 2019-01-12 | Discharge: 2019-01-12 | Disposition: A | Discharge status: Home / Self Care | Payer: 59 | Attending: Family Medicine | Admitting: Family Medicine

## 2019-01-12 ENCOUNTER — Encounter: Payer: Self-pay | Admitting: Emergency Medicine

## 2019-01-12 ENCOUNTER — Encounter (INDEPENDENT_AMBULATORY_CARE_PROVIDER_SITE_OTHER): Payer: Self-pay | Admitting: Physician Assistant

## 2019-01-12 ENCOUNTER — Encounter: Payer: Self-pay | Admitting: Physician Assistant

## 2019-01-12 DIAGNOSIS — R509 Fever, unspecified: Secondary | ICD-10-CM | POA: Diagnosis not present

## 2019-01-12 DIAGNOSIS — R52 Pain, unspecified: Secondary | ICD-10-CM

## 2019-01-12 DIAGNOSIS — R05 Cough: Secondary | ICD-10-CM | POA: Diagnosis not present

## 2019-01-12 DIAGNOSIS — R110 Nausea: Secondary | ICD-10-CM | POA: Diagnosis not present

## 2019-01-12 DIAGNOSIS — R6889 Other general symptoms and signs: Secondary | ICD-10-CM

## 2019-01-12 MED ORDER — BENZONATATE 100 MG PO CAPS: 100.0000 mg | ORAL_CAPSULE | Freq: Three times a day (TID) | ORAL | 0 refills | Status: DC

## 2019-01-12 MED ORDER — OSELTAMIVIR PHOSPHATE 75 MG PO CAPS: 75.0000 mg | ORAL_CAPSULE | Freq: Two times a day (BID) | ORAL | 0 refills | Status: DC

## 2019-01-12 MED ORDER — ONDANSETRON HCL 4 MG PO TABS: 4.0000 mg | ORAL_TABLET | Freq: Four times a day (QID) | ORAL | 0 refills | Status: DC

## 2019-01-12 MED FILL — OSELTAMIVIR PHOSPHATE 75 MG: 75 | 5 days supply | Qty: 10 | Fill #0

## 2019-01-12 MED FILL — ONDANSETRON HCL 4 MG TABLET: 4 | 3 days supply | Qty: 12 | Fill #0

## 2019-01-12 MED FILL — BENZONATATE 100 MG CAPS: 100 | 7 days supply | Qty: 21 | Fill #0

## 2019-01-12 NOTE — ED Provider Notes (Signed)
Cynthia Ochoa   [id number] 01/12/19 Arrival Time: 1001   CC: URI symptoms   SUBJECTIVE: History from: patient.  Cynthia Ochoa is a 43 y.o. female who presents with abrupt onset of cough, nausea, body aches, subjective fever and chills x 3 days.  Denies known sick exposure.  Has tried OTC medications with temporary relief.  Reports previous symptoms in the past.  Denies sinus pain, rhinorrhea, sore throat, SOB, wheezing, chest pain, changes in bowel or bladder habits.    ROS: As per HPI.  Past Medical History:  Diagnosis Date  . Abnormal Pap smear   . Diverticulitis   . Fibroid   . Hx of campylobacteriosis 2005  . Hx of Salmonella gastroenteritis   . IBS (irritable bowel syndrome)    Past Surgical History:  Procedure Laterality Date  . CERVICAL CONIZATION W/BX N/A 09/07/2013   Procedure: CONIZATION CERVIX WITH BIOPSY/REMOVAL OF IUD AND INSERTION;  Surgeon: Cyril Mourning, MD;  Location: [facility];  Service: Gynecology;  Laterality: N/A;  . COLONOSCOPY  2005 NUR   I/E HEMORRHOIDS, TIC AT HF, NL TI  . COLONOSCOPY N/A 12/27/2015   Procedure: COLONOSCOPY;  Surgeon: Danie Binder, MD;  Location: [facility];  Service: Endoscopy;  Laterality: N/A;  0830  . COLPOSCOPY    . CYSTECTOMY     abdomen  . GYNECOLOGIC CRYOSURGERY    . LAPAROSCOPIC APPENDECTOMY N/A 09/14/2016   Procedure: APPENDECTOMY LAPAROSCOPIC;  Surgeon: Aviva Signs, MD;  Location: [facility];  Service: General;  Laterality: N/A;  . VAGINAL DELIVERY  04/07/13  . WISDOM TOOTH EXTRACTION     Allergies  Allergen Reactions  . Ciprofloxacin Other (See Comments)    Nerve pain. - IV only  . Morphine And Related Other (See Comments)    CHEST PRESSURE  . Adhesive [Tape] Rash   No current facility-administered medications on file prior to encounter.    Current Outpatient Medications on File Prior to Encounter  Medication Sig Dispense Refill  . escitalopram (LEXAPRO) 10 MG tablet Take 10 mg by mouth daily.      . Albuterol Sulfate (PROAIR RESPICLICK) 387 (90 Base) MCG/ACT AEPB Inhale 1-2 puffs into the lungs every 6 (six) hours as needed. 1 each 0   Social History   Socioeconomic History  . Marital status: Married    Spouse name: Not on file  . Number of children: Not on file  . Years of education: 34  . Highest education level: Not on file  Occupational History  . Occupation: nurse  Social Needs  . Financial resource strain: Not on file  . Food insecurity:    Worry: Not on file    Inability: Not on file  . Transportation needs:    Medical: Not on file    Non-medical: Not on file  Tobacco Use  . Smoking status: Never Smoker  . Smokeless tobacco: Never Used  Substance and Sexual Activity  . Alcohol use: No  . Drug use: No  . Sexual activity: Yes    Birth control/protection: I.U.D.  Lifestyle  . Physical activity:    Days per week: Not on file    Minutes per session: Not on file  . Stress: Not on file  Relationships  . Social connections:    Talks on phone: Not on file    Gets together: Not on file    Attends religious service: Not on file    Active member of club or organization: Not on file    Attends  meetings of clubs or organizations: Not on file    Relationship status: Not on file  . Intimate partner violence:    Fear of current or ex partner: Not on file    Emotionally abused: Not on file    Physically abused: Not on file    Forced sexual activity: Not on file  Other Topics Concern  . Not on file  Social History Narrative  . Not on file   Family History  Problem Relation Age of Onset  . Hypertension Mother   . Hypertension Father   . Melanoma Paternal Uncle   . Prader-Willi syndrome Brother   . Diabetes Brother   . Hypertension Brother   . COPD Maternal Grandfather   . COPD Paternal Grandmother     OBJECTIVE:  Vitals:   01/12/19 1012  BP: (!) 144/82  Pulse: (!) 105  Resp: 16  Temp: 98.3 F (36.8 C)  TempSrc: Oral  SpO2: 94%     General  appearance: alert; appears fatigued, but nontoxic; speaking in full sentences and tolerating own secretions HEENT: NCAT; Ears: EACs clear, TMs pearly gray; Eyes: PERRL.  EOM grossly intact. Nose: nares patent without rhinorrhea, turbinates swollen and erythematous, Throat: oropharynx clear, tonsils non erythematous or enlarged, uvula midline  Neck: supple without LAD Lungs: unlabored respirations, symmetrical air entry; cough: absent, mild; no respiratory distress; CTAB Heart: regular rate and rhythm.  Radial pulses 2+ symmetrical bilaterally Skin: warm and dry Psychological: alert and cooperative; normal mood and affect  ASSESSMENT & PLAN:  1. Flu-like symptoms     Meds ordered this encounter  Medications  . oseltamivir (TAMIFLU) 75 MG capsule    Sig: Take 1 capsule (75 mg total) by mouth every 12 (twelve) hours.    Dispense:  10 capsule    Refill:  0    Order Specific Question:   Supervising Provider    Answer:   Raylene Everts [[id number]]  . ondansetron (ZOFRAN) 4 MG tablet    Sig: Take 1 tablet (4 mg total) by mouth every 6 (six) hours.    Dispense:  12 tablet    Refill:  0    Order Specific Question:   Supervising Provider    Answer:   Raylene Everts [[id number]]  . benzonatate (TESSALON) 100 MG capsule    Sig: Take 1 capsule (100 mg total) by mouth every 8 (eight) hours.    Dispense:  21 capsule    Refill:  0    Order Specific Question:   Supervising Provider    Answer:   Raylene Everts [[id number]]   Get plenty of rest and push fluids Tessalon Perles prescribed for cough Zofran prescribed.  Use as needed for nausea Tamiflu prescribed.  Take as directed and to completion.   Use OTC medications like ibuprofen or tylenol as needed fever or pain Follow up with PCP if symptoms persist Return or go to ER if you have any new or worsening symptoms fever, chills, nausea, vomiting, chest pain, cough, shortness of breath, wheezing, abdominal pain, changes in bowel or bladder  habits, etc...  Reviewed expectations re: course of current medical issues. Questions answered. Outlined signs and symptoms indicating need for more acute intervention. Patient verbalized understanding. After Visit Summary given.         Lestine Box, PA-C 01/12/19 1138

## 2019-01-12 NOTE — ED Triage Notes (Signed)
Pt presents to [facility] for assessment of of being sick since the day after thanksgiving.  On 12/6 [facility] to  [facility] and [facility].  Patient states she had to follow up with her PCP and do another round with antibiotics and got better.  Starting Monday of this past week she got sick, cough, congestion, chest pain (when she coughs), nausea, and chills.

## 2019-01-12 NOTE — Progress Notes (Signed)
Opened in error  This encounter was created in error - please disregard. 

## 2019-01-12 NOTE — Discharge Instructions (Signed)
Get plenty of rest and push fluids Tessalon Perles prescribed for cough Zofran prescribed.  Use as needed for nausea Tamiflu prescribed.  Take as directed and to completion.   Use OTC medications like ibuprofen or tylenol as needed fever or pain Follow up with PCP if symptoms persist Return or go to ER if you have any new or worsening symptoms fever, chills, nausea, vomiting, chest pain, cough, shortness of breath, wheezing, abdominal pain, changes in bowel or bladder habits, etc..Marland Kitchen

## 2019-01-12 NOTE — ED Notes (Signed)
Patient able to ambulate independently  

## 2019-01-25 MED FILL — ESCITALOPRAM 10 MG TABLET: 10 | 90 days supply | Qty: 90 | Fill #2

## 2019-02-07 DIAGNOSIS — F331 Major depressive disorder, recurrent, moderate: Secondary | ICD-10-CM | POA: Diagnosis not present

## 2019-02-07 DIAGNOSIS — F411 Generalized anxiety disorder: Secondary | ICD-10-CM | POA: Diagnosis not present

## 2019-02-07 MED FILL — ALPRAZolam 0.5 MG TABS: 0.5 | 30 days supply | Qty: 30 | Fill #0 | Status: TO

## 2019-02-07 MED FILL — buPROPion HCL ER (SR) 100 M: 100 | 90 days supply | Qty: 90 | Fill #0

## 2019-04-26 MED FILL — ESCITALOPRAM 10 MG TABLET: 10 | 90 days supply | Qty: 90 | Fill #0

## 2019-04-26 MED FILL — ALPRAZolam 0.5 MG TABS: 0.5 | 30 days supply | Qty: 30 | Fill #0

## 2019-04-26 MED FILL — buPROPion HCL ER (SR) 100 M: 100 | 90 days supply | Qty: 90 | Fill #0

## 2019-06-04 ENCOUNTER — Telehealth: Payer: 59 | Admitting: Family

## 2019-06-04 DIAGNOSIS — H109 Unspecified conjunctivitis: Secondary | ICD-10-CM

## 2019-06-04 MED ORDER — POLYMYXIN B-TRIMETHOPRIM 10000-0.1 UNIT/ML-% OP SOLN: 1.0000 [drp] | OPHTHALMIC | 0 refills | Status: DC

## 2019-06-04 NOTE — Progress Notes (Signed)

## 2019-06-20 DIAGNOSIS — Z01419 Encounter for gynecological examination (general) (routine) without abnormal findings: Secondary | ICD-10-CM | POA: Diagnosis not present

## 2019-06-20 DIAGNOSIS — N871 Moderate cervical dysplasia: Secondary | ICD-10-CM | POA: Diagnosis not present

## 2019-06-20 DIAGNOSIS — Z6841 Body Mass Index (BMI) 40.0 and over, adult: Secondary | ICD-10-CM | POA: Diagnosis not present

## 2019-06-20 MED FILL — CLINDAMYCIN PHOSP 1% LOTION: 1 | 30 days supply | Qty: 60 | Fill #0

## 2019-06-20 MED FILL — DOXYCYCLINE HYC 100 MG CAPS: 100 | 90 days supply | Qty: 180 | Fill #0

## 2019-07-05 ENCOUNTER — Other Ambulatory Visit (HOSPITAL_COMMUNITY): Payer: Self-pay | Admitting: Obstetrics and Gynecology

## 2019-07-05 DIAGNOSIS — Z1231 Encounter for screening mammogram for malignant neoplasm of breast: Secondary | ICD-10-CM

## 2019-07-10 MED FILL — ESCITALOPRAM 10 MG TABLET: 10 | 90 days supply | Qty: 90 | Fill #0

## 2019-07-10 MED FILL — buPROPion HCL ER (SR) 100 M: 100 | 90 days supply | Qty: 90 | Fill #0

## 2019-07-10 MED FILL — ALPRAZolam 0.5 MG TABS: 0.5 | 30 days supply | Qty: 30 | Fill #0

## 2019-07-11 DIAGNOSIS — D225 Melanocytic nevi of trunk: Secondary | ICD-10-CM | POA: Diagnosis not present

## 2019-07-11 DIAGNOSIS — L821 Other seborrheic keratosis: Secondary | ICD-10-CM | POA: Diagnosis not present

## 2019-07-11 DIAGNOSIS — D2271 Melanocytic nevi of right lower limb, including hip: Secondary | ICD-10-CM | POA: Diagnosis not present

## 2019-07-11 DIAGNOSIS — L814 Other melanin hyperpigmentation: Secondary | ICD-10-CM | POA: Diagnosis not present

## 2019-07-11 DIAGNOSIS — D2272 Melanocytic nevi of left lower limb, including hip: Secondary | ICD-10-CM | POA: Diagnosis not present

## 2019-08-04 MED FILL — CLINDAMYCIN PHOSP 1% LOTION: 1 | 30 days supply | Qty: 60 | Fill #1

## 2019-08-24 ENCOUNTER — Other Ambulatory Visit: Payer: Self-pay

## 2019-08-24 ENCOUNTER — Ambulatory Visit (HOSPITAL_COMMUNITY)
Admission: RE | Admit: 2019-08-24 | Discharge: 2019-08-24 | Disposition: A | Discharge status: Home / Self Care | Payer: 59 | Source: Ambulatory Visit | Attending: Obstetrics and Gynecology | Admitting: Obstetrics and Gynecology

## 2019-08-24 DIAGNOSIS — Z1231 Encounter for screening mammogram for malignant neoplasm of breast: Secondary | ICD-10-CM | POA: Insufficient documentation

## 2019-08-25 ENCOUNTER — Ambulatory Visit (HOSPITAL_COMMUNITY): Payer: 59

## 2019-10-06 MED FILL — DOXYCYCLINE HYC 100 MG CAPS: 100 | 90 days supply | Qty: 180 | Fill #1

## 2019-10-06 MED FILL — ESCITALOPRAM 10 MG TABLET: 10 | 90 days supply | Qty: 90 | Fill #1

## 2019-10-06 MED FILL — buPROPion HCL ER (SR) 100 M: 100 | 90 days supply | Qty: 90 | Fill #0

## 2019-10-13 MED FILL — ALPRAZolam 0.5 MG TABS: 0.5 | 30 days supply | Qty: 30 | Fill #0

## 2019-10-18 DIAGNOSIS — H524 Presbyopia: Secondary | ICD-10-CM | POA: Diagnosis not present

## 2019-10-18 DIAGNOSIS — H5213 Myopia, bilateral: Secondary | ICD-10-CM | POA: Diagnosis not present

## 2019-10-18 DIAGNOSIS — H52223 Regular astigmatism, bilateral: Secondary | ICD-10-CM | POA: Diagnosis not present

## 2019-12-28 MED FILL — ALPRAZolam 0.5 MG TABS: 0.5 | 30 days supply | Qty: 30 | Fill #1

## 2019-12-28 MED FILL — ESCITALOPRAM 10 MG TABLET: 10 | 90 days supply | Qty: 90 | Fill #0

## 2020-01-04 MED FILL — buPROPion HCL ER (SR) 100 M: 100 | 90 days supply | Qty: 90 | Fill #0

## 2020-04-15 ENCOUNTER — Other Ambulatory Visit (HOSPITAL_COMMUNITY): Payer: Self-pay | Admitting: Adult Health Nurse Practitioner

## 2020-04-15 DIAGNOSIS — Z6841 Body Mass Index (BMI) 40.0 and over, adult: Secondary | ICD-10-CM | POA: Diagnosis not present

## 2020-04-15 DIAGNOSIS — F331 Major depressive disorder, recurrent, moderate: Secondary | ICD-10-CM | POA: Diagnosis not present

## 2020-04-15 DIAGNOSIS — F419 Anxiety disorder, unspecified: Secondary | ICD-10-CM | POA: Diagnosis not present

## 2020-04-15 MED FILL — ESCITALOPRAM 10 MG TABLET: 10 | 30 days supply | Qty: 45 | Fill #0

## 2020-04-15 MED FILL — ALPRAZolam 0.5 MG TABS: 0.5 | 30 days supply | Qty: 60 | Fill #0

## 2020-04-16 MED FILL — DIETHYLPROPION ER 75 MG TAB: 75 | 30 days supply | Qty: 30 | Fill #0

## 2020-05-20 MED FILL — ESCITALOPRAM 10 MG TABLET: 10 | 30 days supply | Qty: 45 | Fill #1

## 2020-05-20 MED FILL — ALPRAZolam 0.5 MG TABS: 0.5 | 30 days supply | Qty: 60 | Fill #1

## 2020-05-26 ENCOUNTER — Inpatient Hospital Stay (HOSPITAL_COMMUNITY)
Admission: EM | Admit: 2020-05-26 | Discharge: 2020-05-29 | DRG: 392 | Disposition: A | Discharge status: Home / Self Care | Payer: 59 | Attending: Family Medicine | Admitting: Family Medicine

## 2020-05-26 ENCOUNTER — Other Ambulatory Visit: Payer: Self-pay

## 2020-05-26 ENCOUNTER — Emergency Department (HOSPITAL_COMMUNITY): Payer: 59

## 2020-05-26 ENCOUNTER — Encounter (HOSPITAL_COMMUNITY): Payer: Self-pay | Admitting: *Deleted

## 2020-05-26 DIAGNOSIS — K5700 Diverticulitis of small intestine with perforation and abscess without bleeding: Secondary | ICD-10-CM | POA: Diagnosis not present

## 2020-05-26 DIAGNOSIS — R000 Tachycardia, unspecified: Secondary | ICD-10-CM | POA: Diagnosis not present

## 2020-05-26 DIAGNOSIS — Z885 Allergy status to narcotic agent status: Secondary | ICD-10-CM

## 2020-05-26 DIAGNOSIS — R1032 Left lower quadrant pain: Secondary | ICD-10-CM | POA: Diagnosis not present

## 2020-05-26 DIAGNOSIS — Z91048 Other nonmedicinal substance allergy status: Secondary | ICD-10-CM | POA: Diagnosis not present

## 2020-05-26 DIAGNOSIS — K5792 Diverticulitis of intestine, part unspecified, without perforation or abscess without bleeding: Secondary | ICD-10-CM | POA: Diagnosis present

## 2020-05-26 DIAGNOSIS — Z881 Allergy status to other antibiotic agents status: Secondary | ICD-10-CM | POA: Diagnosis not present

## 2020-05-26 DIAGNOSIS — Z20822 Contact with and (suspected) exposure to covid-19: Secondary | ICD-10-CM | POA: Diagnosis present

## 2020-05-26 DIAGNOSIS — F411 Generalized anxiety disorder: Secondary | ICD-10-CM | POA: Diagnosis present

## 2020-05-26 DIAGNOSIS — Z833 Family history of diabetes mellitus: Secondary | ICD-10-CM

## 2020-05-26 DIAGNOSIS — Z8249 Family history of ischemic heart disease and other diseases of the circulatory system: Secondary | ICD-10-CM | POA: Diagnosis not present

## 2020-05-26 DIAGNOSIS — Z808 Family history of malignant neoplasm of other organs or systems: Secondary | ICD-10-CM | POA: Diagnosis not present

## 2020-05-26 DIAGNOSIS — K5732 Diverticulitis of large intestine without perforation or abscess without bleeding: Secondary | ICD-10-CM | POA: Diagnosis not present

## 2020-05-26 DIAGNOSIS — Z03818 Encounter for observation for suspected exposure to other biological agents ruled out: Secondary | ICD-10-CM | POA: Diagnosis not present

## 2020-05-26 DIAGNOSIS — K828 Other specified diseases of gallbladder: Secondary | ICD-10-CM | POA: Diagnosis not present

## 2020-05-26 DIAGNOSIS — Z79899 Other long term (current) drug therapy: Secondary | ICD-10-CM | POA: Diagnosis not present

## 2020-05-26 DIAGNOSIS — K6389 Other specified diseases of intestine: Secondary | ICD-10-CM | POA: Diagnosis not present

## 2020-05-26 DIAGNOSIS — K429 Umbilical hernia without obstruction or gangrene: Secondary | ICD-10-CM | POA: Diagnosis not present

## 2020-05-26 DIAGNOSIS — Z6841 Body Mass Index (BMI) 40.0 and over, adult: Secondary | ICD-10-CM | POA: Diagnosis not present

## 2020-05-26 DIAGNOSIS — K631 Perforation of intestine (nontraumatic): Secondary | ICD-10-CM | POA: Diagnosis not present

## 2020-05-26 DIAGNOSIS — E66813 Obesity, class 3: Secondary | ICD-10-CM | POA: Diagnosis present

## 2020-05-26 DIAGNOSIS — Z8619 Personal history of other infectious and parasitic diseases: Secondary | ICD-10-CM | POA: Diagnosis not present

## 2020-05-26 DIAGNOSIS — D259 Leiomyoma of uterus, unspecified: Secondary | ICD-10-CM | POA: Diagnosis not present

## 2020-05-26 DIAGNOSIS — K5780 Diverticulitis of intestine, part unspecified, with perforation and abscess without bleeding: Secondary | ICD-10-CM | POA: Diagnosis present

## 2020-05-26 DIAGNOSIS — Z825 Family history of asthma and other chronic lower respiratory diseases: Secondary | ICD-10-CM | POA: Diagnosis not present

## 2020-05-26 DIAGNOSIS — K5720 Diverticulitis of large intestine with perforation and abscess without bleeding: Principal | ICD-10-CM | POA: Diagnosis present

## 2020-05-26 DIAGNOSIS — M25512 Pain in left shoulder: Secondary | ICD-10-CM | POA: Diagnosis not present

## 2020-05-26 LAB — URINALYSIS, ROUTINE W REFLEX MICROSCOPIC
Bacteria, UA: NONE SEEN
Bilirubin Urine: NEGATIVE
Glucose, UA: NEGATIVE mg/dL
Ketones, ur: NEGATIVE mg/dL
Leukocytes,Ua: NEGATIVE
Nitrite: NEGATIVE
Protein, ur: NEGATIVE mg/dL
Specific Gravity, Urine: 1.027 (ref 1.005–1.030)
pH: 5 (ref 5.0–8.0)

## 2020-05-26 LAB — COMPREHENSIVE METABOLIC PANEL
ALT: 19 U/L (ref 0–44)
AST: 13 U/L — ABNORMAL LOW (ref 15–41)
Albumin: 4.1 g/dL (ref 3.5–5.0)
Alkaline Phosphatase: 69 U/L (ref 38–126)
Anion gap: 7 (ref 5–15)
BUN: 10 mg/dL (ref 6–20)
CO2: 25 mmol/L (ref 22–32)
Calcium: 8.9 mg/dL (ref 8.9–10.3)
Chloride: 105 mmol/L (ref 98–111)
Creatinine, Ser: 0.92 mg/dL (ref 0.44–1.00)
GFR calc Af Amer: >60 mL/min (ref >60)
GFR calc non Af Amer: >60 mL/min (ref >60)
Glucose, Bld: 124 mg/dL — ABNORMAL HIGH (ref 70–99)
Potassium: 3.4 mmol/L — ABNORMAL LOW (ref 3.5–5.1)
Sodium: 137 mmol/L (ref 135–145)
Total Bilirubin: 0.7 mg/dL (ref 0.3–1.2)
Total Protein: 7.2 g/dL (ref 6.5–8.1)

## 2020-05-26 LAB — CBC
HCT: 44.9 % (ref 36.0–46.0)
Hemoglobin: 14.8 g/dL (ref 12.0–15.0)
MCH: 29.7 pg (ref 26.0–34.0)
MCHC: 33 g/dL (ref 30.0–36.0)
MCV: 90.2 fL (ref 80.0–100.0)
Platelets: 234 10*3/uL (ref 150–400)
RBC: 4.98 MIL/uL (ref 3.87–5.11)
RDW: 12.7 % (ref 11.5–15.5)
WBC: 15.5 10*3/uL — ABNORMAL HIGH (ref 4.0–10.5)
nRBC: 0 % (ref 0.0–0.2)

## 2020-05-26 LAB — SARS CORONAVIRUS 2 BY RT PCR (HOSPITAL ORDER, PERFORMED IN ~~LOC~~ HOSPITAL LAB): SARS Coronavirus 2: NEGATIVE

## 2020-05-26 LAB — POC URINE PREG, ED: Preg Test, Ur: NEGATIVE

## 2020-05-26 LAB — LIPASE, BLOOD: Lipase: 21 U/L (ref 11–51)

## 2020-05-26 MED ORDER — BUPROPION HCL ER (SR) 100 MG PO TB12
100.0000 mg | ORAL_TABLET | Freq: Every morning | ORAL | Status: DC
  Administered 2020-05-27: 100 mg via ORAL
  Filled 2020-05-26: qty 1

## 2020-05-26 MED ORDER — ONDANSETRON HCL 4 MG/2ML IJ SOLN
4.0000 mg | Freq: Once | INTRAMUSCULAR | Status: AC
  Administered 2020-05-26: 4 mg via INTRAVENOUS
  Filled 2020-05-26: qty 2

## 2020-05-26 MED ORDER — ONDANSETRON 4 MG PO TBDP: 4.0000 mg | ORAL_TABLET | Freq: Once | ORAL | Status: DC | PRN

## 2020-05-26 MED ORDER — SODIUM CHLORIDE 0.9 % IV SOLN: 250.0000 mL | INTRAVENOUS | Status: DC | PRN

## 2020-05-26 MED ORDER — IOHEXOL 300 MG/ML  SOLN
100.0000 mL | Freq: Once | INTRAMUSCULAR | Status: AC | PRN
  Administered 2020-05-26: 100 mL via INTRAVENOUS

## 2020-05-26 MED ORDER — SODIUM CHLORIDE 0.9% FLUSH: 3.0000 mL | INTRAVENOUS | Status: DC | PRN

## 2020-05-26 MED ORDER — ALBUTEROL SULFATE 108 (90 BASE) MCG/ACT IN AEPB: 1.0000 | INHALATION_SPRAY | Freq: Four times a day (QID) | RESPIRATORY_TRACT | Status: DC | PRN

## 2020-05-26 MED ORDER — MORPHINE SULFATE (PF) 2 MG/ML IV SOLN
2.0000 mg | INTRAVENOUS | Status: DC | PRN
  Administered 2020-05-26: 2 mg via INTRAVENOUS
  Filled 2020-05-26: qty 1

## 2020-05-26 MED ORDER — ONDANSETRON HCL 4 MG/2ML IJ SOLN
4.0000 mg | Freq: Four times a day (QID) | INTRAMUSCULAR | Status: DC | PRN
  Administered 2020-05-26 – 2020-05-27 (×2): 4 mg via INTRAVENOUS
  Filled 2020-05-26 (×2): qty 2

## 2020-05-26 MED ORDER — ENOXAPARIN SODIUM 60 MG/0.6ML ~~LOC~~ SOLN
60.0000 mg | Freq: Every day | SUBCUTANEOUS | Status: DC
  Administered 2020-05-27 – 2020-05-28 (×3): 60 mg via SUBCUTANEOUS
  Filled 2020-05-26 (×3): qty 0.6

## 2020-05-26 MED ORDER — ALPRAZOLAM 0.5 MG PO TABS
0.5000 mg | ORAL_TABLET | Freq: Two times a day (BID) | ORAL | Status: DC | PRN
  Filled 2020-05-26: qty 1

## 2020-05-26 MED ORDER — BISACODYL 10 MG RE SUPP: 10.0000 mg | Freq: Every day | RECTAL | Status: DC | PRN

## 2020-05-26 MED ORDER — ONDANSETRON HCL 4 MG PO TABS
4.0000 mg | ORAL_TABLET | Freq: Four times a day (QID) | ORAL | Status: DC | PRN
  Administered 2020-05-28: 4 mg via ORAL
  Filled 2020-05-26: qty 1

## 2020-05-26 MED ORDER — ESCITALOPRAM OXALATE 10 MG PO TABS
10.0000 mg | ORAL_TABLET | Freq: Every day | ORAL | Status: DC
  Administered 2020-05-27: 10 mg via ORAL
  Filled 2020-05-26: qty 1

## 2020-05-26 MED ORDER — PIPERACILLIN-TAZOBACTAM 3.375 G IVPB
3.3750 g | Freq: Three times a day (TID) | INTRAVENOUS | Status: DC
  Administered 2020-05-27 – 2020-05-29 (×7): 3.375 g via INTRAVENOUS
  Filled 2020-05-26 (×8): qty 50

## 2020-05-26 MED ORDER — MORPHINE SULFATE (PF) 2 MG/ML IV SOLN: 1.0000 mg | INTRAVENOUS | Status: DC | PRN

## 2020-05-26 MED ORDER — POLYETHYLENE GLYCOL 3350 17 G PO PACK: 17.0000 g | PACK | Freq: Every day | ORAL | Status: DC | PRN

## 2020-05-26 MED ORDER — LACTATED RINGERS IV SOLN: INTRAVENOUS | Status: DC

## 2020-05-26 MED ORDER — HYDROMORPHONE HCL 1 MG/ML IJ SOLN
0.5000 mg | Freq: Once | INTRAMUSCULAR | Status: AC
  Administered 2020-05-26: 0.5 mg via INTRAVENOUS
  Filled 2020-05-26: qty 1

## 2020-05-26 MED ORDER — ACETAMINOPHEN 325 MG PO TABS: 650.0000 mg | ORAL_TABLET | Freq: Four times a day (QID) | ORAL | Status: DC | PRN

## 2020-05-26 MED ORDER — PIPERACILLIN-TAZOBACTAM 3.375 G IVPB 30 MIN
3.3750 g | Freq: Once | INTRAVENOUS | Status: AC
  Administered 2020-05-26: 3.375 g via INTRAVENOUS
  Filled 2020-05-26: qty 50

## 2020-05-26 MED ORDER — HYDROMORPHONE HCL 1 MG/ML IJ SOLN
1.0000 mg | Freq: Once | INTRAMUSCULAR | Status: AC
  Administered 2020-05-26: 1 mg via INTRAVENOUS
  Filled 2020-05-26: qty 1

## 2020-05-26 MED ORDER — ALBUTEROL SULFATE (2.5 MG/3ML) 0.083% IN NEBU: 2.5000 mg | INHALATION_SOLUTION | Freq: Four times a day (QID) | RESPIRATORY_TRACT | Status: DC | PRN

## 2020-05-26 MED ORDER — ACETAMINOPHEN 650 MG RE SUPP: 650.0000 mg | Freq: Four times a day (QID) | RECTAL | Status: DC | PRN

## 2020-05-26 MED ORDER — SODIUM CHLORIDE 0.9% FLUSH
3.0000 mL | Freq: Two times a day (BID) | INTRAVENOUS | Status: DC
  Administered 2020-05-27 – 2020-05-28 (×2): 3 mL via INTRAVENOUS

## 2020-05-26 NOTE — ED Notes (Signed)
Bed ready   Call to floor   No answer

## 2020-05-26 NOTE — Progress Notes (Signed)
[facility]  Reviewed CT and lower descending colon with thickening and stranding, possible microperforation reported medially but difficult to really see. No abscess.   Recommend Hospitalist admission. IV Zosyn NPO for now Will see tomorrow.  Curlene Labrum, MD [facility] [address] [place], [place] [address] [phone] (office)

## 2020-05-26 NOTE — ED Notes (Signed)
Orders received. Patient medicated. See MAR for administration details.

## 2020-05-26 NOTE — Consult Note (Signed)
[facility] Consult  Reason for Consult: Perforated diverticulitis  Referring Physician:  Dr. Langston Masker  Chief Complaint    Abdominal Pain      HPI: Cynthia Ochoa is a 44 y.o. female with a 2 day history of abdominal pain after straining to use the restroom yesterday evening. She started vomiting @ 2pm. .  The patient has had a prior history of diverticulitis in 2017 and then perforation in 2018 when she was prepping for a colonoscopy. She says that she has been referred to surgery in the past to get a subtotal colectomy, but Dr. Dalbert Batman did not feel like she needed that at the time. She has not had an episode since 2018.  She reports pain mostly in the left flank area with radiation to her midback.  She denies any fevers or chills. She had BMs the day of admission.    Past Medical History:  Diagnosis Date  . Abnormal Pap smear   . Diverticulitis   . Fibroid   . Hx of campylobacteriosis 2005  . Hx of Salmonella gastroenteritis   . IBS (irritable bowel syndrome)     Past Surgical History:  Procedure Laterality Date  . CERVICAL CONIZATION W/BX N/A 09/07/2013   Procedure: CONIZATION CERVIX WITH BIOPSY/REMOVAL OF IUD AND INSERTION;  Surgeon: Cyril Mourning, MD;  Location: [facility];  Service: Gynecology;  Laterality: N/A;  . COLONOSCOPY  2005 NUR   I/E HEMORRHOIDS, TIC AT HF, NL TI  . COLONOSCOPY N/A 12/27/2015   Procedure: COLONOSCOPY;  Surgeon: Danie Binder, MD;  Location: [facility];  Service: Endoscopy;  Laterality: N/A;  0830  . COLPOSCOPY    . CYSTECTOMY     abdomen  . GYNECOLOGIC CRYOSURGERY    . LAPAROSCOPIC APPENDECTOMY N/A 09/14/2016   Procedure: APPENDECTOMY LAPAROSCOPIC;  Surgeon: Aviva Signs, MD;  Location: [facility];  Service: General;  Laterality: N/A;  . VAGINAL DELIVERY  04/07/13  . WISDOM TOOTH EXTRACTION      Family History  Problem Relation Age of Onset  . Hypertension Mother   . Hypertension Father   . Melanoma Paternal Uncle   .  Prader-Willi syndrome Brother   . Diabetes Brother   . Hypertension Brother   . COPD Maternal Grandfather   . COPD Paternal Grandmother     Social History   Tobacco Use  . Smoking status: Never Smoker  . Smokeless tobacco: Never Used  Substance Use Topics  . Alcohol use: No  . Drug use: No    Medications: I have reviewed the patient's current medications. Current Facility-Administered Medications  Medication Dose Route Frequency Provider Last Rate Last Admin  . 0.9 %  sodium chloride infusion  250 mL Intravenous PRN Lynetta Mare, MD      . acetaminophen (TYLENOL) tablet 650 mg  650 mg Oral Q6H PRN Lynetta Mare, MD       Or  . acetaminophen (TYLENOL) suppository 650 mg  650 mg Rectal Q6H PRN Lynetta Mare, MD      . albuterol (PROVENTIL) (2.5 MG/3ML) 0.083% nebulizer solution 2.5 mg  2.5 mg Nebulization Q6H PRN Lynetta Mare, MD      . ALPRAZolam Duanne Moron) tablet 0.5 mg  0.5 mg Oral BID PRN Lynetta Mare, MD      . bisacodyl (DULCOLAX) suppository 10 mg  10 mg Rectal Daily PRN Lynetta Mare, MD      . enoxaparin (LOVENOX) injection 60 mg  60 mg Subcutaneous QHS Gadhia,  Vic Ripper, MD   60 mg at 05/27/20 0025  . [START ON 05/28/2020] escitalopram (LEXAPRO) tablet 15 mg  15 mg Oral Daily Emokpae, Courage, MD      . HYDROmorphone (DILAUDID) injection 1 mg  1 mg Intravenous Q4H PRN Lynetta Mare, MD   1 mg at 05/27/20 0936  . lactated ringers infusion   Intravenous Continuous Lynetta Mare, MD 125 mL/hr at 05/27/20 0030 New Bag at 05/27/20 0030  . ondansetron (ZOFRAN) tablet 4 mg  4 mg Oral Q6H PRN Lynetta Mare, MD       Or  . ondansetron [facility]) injection 4 mg  4 mg Intravenous Q6H PRN Lynetta Mare, MD   4 mg at 05/27/20 0541  . oxyCODONE (Oxy IR/ROXICODONE) immediate release tablet 5-10 mg  5-10 mg Oral Q4H PRN Virl Cagey, MD      . piperacillin-tazobactam (ZOSYN) IVPB 3.375 g  3.375 g Intravenous Q8H Erenest Blank, RPH 12.5 mL/hr at  05/27/20 0546 3.375 g at 05/27/20 0546  . polyethylene glycol (MIRALAX / GLYCOLAX) packet 17 g  17 g Oral Daily PRN Lynetta Mare, MD      . promethazine (PHENERGAN) injection 12.5 mg  12.5 mg Intravenous Q6H PRN Lynetta Mare, MD   12.5 mg at 05/27/20 0752  . sodium chloride flush (NS) 0.9 % injection 3 mL  3 mL Intravenous Q12H Lynetta Mare, MD   3 mL at 05/27/20 0038  . sodium chloride flush (NS) 0.9 % injection 3 mL  3 mL Intravenous PRN Lynetta Mare, MD      . traZODone (DESYREL) tablet 100 mg  100 mg Oral QHS Roxan Hockey, MD        Allergies  Allergen Reactions  . Ciprofloxacin Other (See Comments)    Nerve pain. - IV only  . Morphine And Related Other (See Comments)    CHEST PRESSURE  . Adhesive [Tape] Rash     ROS:  A comprehensive review of systems was negative except for: Constitutional: positive for malaise Gastrointestinal: positive for abdominal pain, nausea and vomiting  Blood pressure (!) 141/71, pulse 97, temperature 98.5 F (36.9 C), temperature source Oral, resp. rate 20, height 5\' 7"  (1.702 m), weight 121.6 kg, SpO2 95 %. Physical Exam Vitals reviewed.  Constitutional:      Appearance: She is well-developed.  HENT:     Head: Normocephalic and atraumatic.  Eyes:     Extraocular Movements: Extraocular movements intact.  Cardiovascular:     Rate and Rhythm: Normal rate.  Pulmonary:     Effort: Pulmonary effort is normal.  Abdominal:     Palpations: Abdomen is soft.     Tenderness: There is no guarding.     Comments: Left flank/ side tenderness, no tenderness in the lower abdomen  Musculoskeletal:     Comments: Moves all extremities   Skin:    General: Skin is warm and dry.  Neurological:     General: No focal deficit present.     Mental Status: She is alert and oriented to person, place, and time.  Psychiatric:        Mood and Affect: Mood normal.        Behavior: Behavior normal.     Results: Results for orders placed or  performed during the hospital encounter of 05/26/20 (from the past 48 hour(s))  Urinalysis, Routine w reflex microscopic     Status: Abnormal   Collection Time: 05/26/20  4:23 PM  Result Value Ref Range   Color, Urine YELLOW YELLOW   APPearance HAZY (A) CLEAR   Specific Gravity, Urine 1.027 1.005 - 1.030   pH 5.0 5.0 - 8.0   Glucose, UA NEGATIVE NEGATIVE mg/dL   Hgb urine dipstick SMALL (A) NEGATIVE   Bilirubin Urine NEGATIVE NEGATIVE   Ketones, ur NEGATIVE NEGATIVE mg/dL   Protein, ur NEGATIVE NEGATIVE mg/dL   Nitrite NEGATIVE NEGATIVE   Leukocytes,Ua NEGATIVE NEGATIVE   RBC / HPF 0-5 0 - 5 RBC/hpf   WBC, UA 6-10 0 - 5 WBC/hpf   Bacteria, UA NONE SEEN NONE SEEN   Squamous Epithelial / LPF 0-5 0 - 5   Mucus PRESENT     Comment: Performed at [facility], [address], [place], [place] [address]  Lipase, blood     Status: None   Collection Time: 05/26/20  4:34 PM  Result Value Ref Range   Lipase 21 11 - 51 U/L    Comment: Performed at [facility] [address], [place], [place] [address]  Comprehensive metabolic panel     Status: Abnormal   Collection Time: 05/26/20  4:34 PM  Result Value Ref Range   Sodium 137 135 - 145 mmol/L   Potassium 3.4 (L) 3.5 - 5.1 mmol/L   Chloride 105 98 - 111 mmol/L   CO2 25 22 - 32 mmol/L   Glucose, Bld 124 (H) 70 - 99 mg/dL    Comment: Glucose reference range applies only to samples taken after fasting for at least 8 hours.   BUN 10 6 - 20 mg/dL   Creatinine, Ser 0.92 0.44 - 1.00 mg/dL   Calcium 8.9 8.9 - 10.3 mg/dL   Total Protein 7.2 6.5 - 8.1 g/dL   Albumin 4.1 3.5 - 5.0 g/dL   AST 13 (L) 15 - 41 U/L   ALT 19 0 - 44 U/L   Alkaline Phosphatase 69 38 - 126 U/L   Total Bilirubin 0.7 0.3 - 1.2 mg/dL   GFR calc non Af Amer >60 >60 mL/min   GFR calc Af Amer >60 >60 mL/min   Anion gap 7 5 - 15    Comment: Performed at [facility], [address], [place], [place] [address]  CBC     Status: Abnormal   Collection Time: 05/26/20  4:34 PM   Result Value Ref Range   WBC 15.5 (H) 4.0 - 10.5 K/uL   RBC 4.98 3.87 - 5.11 MIL/uL   Hemoglobin 14.8 12.0 - 15.0 g/dL   HCT 44.9 36 - 46 %   MCV 90.2 80.0 - 100.0 fL   MCH 29.7 26.0 - 34.0 pg   MCHC 33.0 30.0 - 36.0 g/dL   RDW 12.7 11.5 - 15.5 %   Platelets 234 150 - 400 K/uL   nRBC 0.0 0.0 - 0.2 %    Comment: Performed at [facility], [address], [place], [place] [address]  POC urine preg, ED     Status: None   Collection Time: 05/26/20  4:54 PM  Result Value Ref Range   Preg Test, Ur NEGATIVE NEGATIVE    Comment:        THE SENSITIVITY OF THIS METHODOLOGY IS >24 mIU/mL   SARS Coronavirus 2 by RT PCR (hospital order, performed in [place] hospital lab) Nasopharyngeal Nasopharyngeal Swab     Status: None   Collection Time: 05/26/20  7:02 PM   Specimen: Nasopharyngeal Swab  Result Value Ref Range   SARS Coronavirus 2 NEGATIVE  NEGATIVE    Comment: (NOTE) SARS-CoV-2 target nucleic acids are NOT DETECTED.  The SARS-CoV-2 RNA is generally detectable in upper and lower respiratory specimens during the acute phase of infection. The lowest concentration of SARS-CoV-2 viral copies this assay can detect is 250 copies / mL. A negative result does not preclude SARS-CoV-2 infection and should not be used as the sole basis for treatment or other patient management decisions.  A negative result may occur with improper specimen collection / handling, submission of specimen other than nasopharyngeal swab, presence of viral mutation(s) within the areas targeted by this assay, and inadequate number of viral copies (<250 copies / mL). A negative result must be combined with clinical observations, patient history, and epidemiological information.  Fact Sheet for Patients:   StrictlyIdeas.no  Fact Sheet for Healthcare Providers: BankingDealers.co.za  This test is not yet approved or  cleared by the [place] FDA and has been  authorized for detection and/or diagnosis of SARS-CoV-2 by FDA under an Emergency Use Authorization (EUA).  This EUA will remain in effect (meaning this test can be used) for the duration of the COVID-19 declaration under Section 564(b)(1) of the Act, 21 U.S.C. section 360bbb-3(b)(1), unless the authorization is terminated or revoked sooner.  Performed at [facility], [address], [place], [place] [address]    Personally reviewed CT- ? microperforation on the left descending colon, no collection   DG Chest 2 View  Result Date: 05/26/2020 CLINICAL DATA:  LEFT shoulder pain EXAM: CHEST - 2 VIEW COMPARISON:  12/09/2018 FINDINGS: Trachea is midline. Cardiomediastinal contours and hilar structures are normal. Lungs are clear. No sign of pleural effusion. On limited assessment skeletal structures without acute process. IMPRESSION: No acute cardiopulmonary disease. Electronically Signed   By: Zetta Bills M.D.   On: 05/26/2020 17:45   CT ABDOMEN PELVIS W CONTRAST  Result Date: 05/26/2020 CLINICAL DATA:  Suspect diverticulitis. Nausea, vomiting, LEFT LOWER QUADRANT abdominal pain since last night. History of diverticulitis. EXAM: CT ABDOMEN AND PELVIS WITH CONTRAST TECHNIQUE: Multidetector CT imaging of the abdomen and pelvis was performed using the standard protocol following bolus administration of intravenous contrast. CONTRAST:  168mL OMNIPAQUE IOHEXOL 300 MG/ML  SOLN COMPARISON:  09/13/2016 FINDINGS: Lower chest: No acute abnormality. Hepatobiliary: Gallbladder is distended. No pericholecystic fluid. No intrahepatic biliary duct dilatation. Liver is unremarkable. Pancreas: Unremarkable. No pancreatic ductal dilatation or surrounding inflammatory changes. Spleen: Normal in size without focal abnormality. Adrenals/Urinary Tract: Adrenal glands are unremarkable. Kidneys are normal, without renal calculi, focal lesion, or hydronephrosis. Bladder is unremarkable. Stomach/Bowel: Stomach is normal in  appearance. Small bowel loops are normal in caliber and wall thickness. There are numerous diverticula throughout the colon, especially involving the descending and sigmoid segments. There is moderate inflammatory change adjacent to the LOWER descending colon, in a segment of numerous diverticula. Suspect micro perforation just MEDIAL to the LOWER descending colon. No abscess or evidence for free intraperitoneal air. Appendectomy. Vascular/Lymphatic: No significant vascular findings are present. No enlarged abdominal or pelvic lymph nodes. Reproductive: Uterus is present. Intrauterine device is well positioned. Posterior calcified fibroid is 2.5 centimeters. The adnexal regions are unremarkable. Other: Small fat containing paraumbilical hernias. Musculoskeletal: Mild degenerative changes in the LOWER thoracic and lumbar spine. IMPRESSION: 1. Findings are consistent with acute diverticulitis involving the LOWER descending colon. Suspect micro perforation just MEDIAL to the LOWER descending colon. No abscess or free intraperitoneal air. 2. Distended gallbladder without evidence for acute cholecystitis. 3. Small fat containing paraumbilical hernias. 4. Intrauterine device.  5. Small calcified uterine fibroid. Electronically Signed   By: Nolon Nations M.D.   On: 05/26/2020 18:13     Assessment & Plan:  Cynthia Ochoa is a 44 y.o. female with microperforation of the descending colon. Pain controlled with IV medication.  -Can have sips of water -IV zosyn for diverticulitis -Likely in the hospital for 3-4 days -PRN For pain control  We have discussed that diverticulitis is a spectrum of disease. We discussed that it ranges from simple diverticulitis that can be treated with oral antibiotics as an outpatient to severe cases with perforations that require emergency surgery and colostomy.  We discussed that there case is more slightly more complicated in nature.  We have discussed that some of the complicated  cases require admission to the hospital for IV antibiotics and others still require Interventional Radiology drainage of an abscess. We have discussed that during the acute inflammation and infection period that it is more likely to need an colostomy due to the risk of the colon anastomosis not healing and result in a leak.  We discussed better surgical outcomes and higher chance of a anastomosis if we are able to get the patient 4-6 weeks past the acute infection and inflammatory period.  We discussed that even still with an anastomosis a diverting ileostomy may be performed if there is any concern.    All questions were answered to the satisfaction of the patient.    Virl Cagey 05/26/2020, 11:13 PM

## 2020-05-26 NOTE — ED Provider Notes (Signed)
[facility] EMERGENCY DEPARTMENT Provider Note   CSN: [id number] Arrival date & time: 05/26/20  1556     History Chief Complaint  Patient presents with  . Abdominal Pain    Cynthia Ochoa is a 44 y.o. female with past medical history of diverticulitis with perforation, IBS, who presents today for evaluation of left lower quadrant abdominal pain.  She reports that about 4 PM last night she was straining to have a bowel movement and had a sudden onset of pain in her lower abdomen.  She states that it radiates from her back left side into the abdomen.  Today she has developed pain around her left shoulder.  She denies chest pain or shortness of breath.  She reports that she has vomited 3 times today.  She has had 2 bowel movements since the start and denies any blood in her bowel movements.  She states that she does not have a history of kidney stones, denies dysuria increased frequency or urgency.  She is fully vaccinated against COVID-19.  She denies any fevers.    She has morphine listed as an allergy however reports that she has tolerated Dilaudid, Demerol, and fentanyl without difficulty.  Her reaction to morphine was chest tightness.  HPI     Past Medical History:  Diagnosis Date  . Abnormal Pap smear   . Diverticulitis   . Fibroid   . Hx of campylobacteriosis 2005  . Hx of Salmonella gastroenteritis   . IBS (irritable bowel syndrome)     Patient Active Problem List   Diagnosis Date Noted  . Acute diverticulitis 05/26/2020  . Bilateral otitis media with effusion 10/28/2018  . Chest congestion 10/28/2018  . Cough 10/28/2018  . Appendicitis, acute 09/14/2016  . Diverticulitis of colon   . Perforated intestine ([place]) 11/12/2015  . Diverticulitis of intestine with perforation 11/12/2015  . Acute diverticulitis of intestine 11/12/2015  . Prolapsed internal hemorrhoids, grade 3 11/08/2014  . Rotator cuff syndrome of left shoulder 07/05/2012  . IMPINGEMENT SYNDROME  03/03/2010  . RUPTURE ROTATOR CUFF 03/03/2010  . NAUSEA WITH VOMITING 12/04/2009  . EAR PAIN, LEFT 02/20/2009  . OBESITY, UNSPECIFIED 12/19/2007  . ANXIETY DISORDER, GENERALIZED 12/19/2007  . SINUSITIS, ACUTE 12/19/2007  . BRONCHITIS, ACUTE 12/19/2007    Past Surgical History:  Procedure Laterality Date  . CERVICAL CONIZATION W/BX N/A 09/07/2013   Procedure: CONIZATION CERVIX WITH BIOPSY/REMOVAL OF IUD AND INSERTION;  Surgeon: Cyril Mourning, MD;  Location: [facility];  Service: Gynecology;  Laterality: N/A;  . COLONOSCOPY  2005 NUR   I/E HEMORRHOIDS, TIC AT HF, NL TI  . COLONOSCOPY N/A 12/27/2015   Procedure: COLONOSCOPY;  Surgeon: Danie Binder, MD;  Location: [facility];  Service: Endoscopy;  Laterality: N/A;  0830  . COLPOSCOPY    . CYSTECTOMY     abdomen  . GYNECOLOGIC CRYOSURGERY    . LAPAROSCOPIC APPENDECTOMY N/A 09/14/2016   Procedure: APPENDECTOMY LAPAROSCOPIC;  Surgeon: Aviva Signs, MD;  Location: [facility];  Service: General;  Laterality: N/A;  . VAGINAL DELIVERY  04/07/13  . WISDOM TOOTH EXTRACTION       OB History    Gravida  1   Para  1   Term  1   Preterm      AB      Living  1     SAB      TAB      Ectopic      Multiple      Live  Births  1           Family History  Problem Relation Age of Onset  . Hypertension Mother   . Hypertension Father   . Melanoma Paternal Uncle   . Prader-Willi syndrome Brother   . Diabetes Brother   . Hypertension Brother   . COPD Maternal Grandfather   . COPD Paternal Grandmother     Social History   Tobacco Use  . Smoking status: Never Smoker  . Smokeless tobacco: Never Used  Substance Use Topics  . Alcohol use: No  . Drug use: No    Home Medications Prior to Admission medications   Medication Sig Start Date End Date Taking? Authorizing Provider  Albuterol Sulfate (PROAIR RESPICLICK) 381 (90 Base) MCG/ACT AEPB Inhale 1-2 puffs into the lungs every 6 (six) hours as needed. 10/28/18  Yes Rasch,  Artist Pais, NP  ALPRAZolam Duanne Moron) 0.5 MG tablet Take 0.5 mg by mouth 2 (two) times daily as needed for anxiety. 05/20/20  Yes [provider]  buPROPion (WELLBUTRIN SR) 100 MG 12 hr tablet Take 100 mg by mouth every morning. 01/04/20  Yes [provider]  Diethylpropion HCl CR 75 MG TB24 Take 1 tablet by mouth daily. 04/16/20  Yes [provider]  escitalopram (LEXAPRO) 10 MG tablet Take 10 mg by mouth daily.   Yes [provider]    Allergies    Ciprofloxacin, Morphine and related, and Adhesive [tape]  Review of Systems   Review of Systems  Constitutional: Negative for chills and fever.  Respiratory: Negative for apnea, cough and chest tightness.   Cardiovascular: Negative for chest pain.       Pain in left shoulder  Gastrointestinal: Positive for abdominal pain, nausea and vomiting. Negative for blood in stool.  Genitourinary: Positive for flank pain. Negative for dysuria and urgency.  Musculoskeletal: Positive for back pain. Negative for neck pain.  Skin: Negative for color change and rash.  Neurological: Negative for weakness and headaches.  All other systems reviewed and are negative.   Physical Exam Updated Vital Signs BP (!) 141/71   Pulse 97   Temp 98.5 F (36.9 C) (Oral)   Resp 20   Ht 5\' 7"  (1.702 m)   Wt 121.6 kg   SpO2 95%   BMI 41.97 kg/m   Physical Exam Vitals and nursing note reviewed.  Constitutional:      Appearance: She is well-developed. She is obese.     Comments: Appears uncomfortable, is constantly moving in the room.   HENT:     Head: Normocephalic and atraumatic.     Right Ear: External ear normal.     Left Ear: External ear normal.     Nose: Nose normal.  Eyes:     General: No scleral icterus.       Right eye: No discharge.        Left eye: No discharge.     Conjunctiva/sclera: Conjunctivae normal.     Pupils: Pupils are equal, round, and reactive to light.  Neck:     Trachea: No tracheal deviation.    Cardiovascular:     Rate and Rhythm: Regular rhythm. Tachycardia present.     Heart sounds: Normal heart sounds. No murmur heard.  No friction rub. No gallop.   Pulmonary:     Effort: Pulmonary effort is normal. No respiratory distress.     Breath sounds: Normal breath sounds. No stridor.  Abdominal:     General: Bowel sounds are normal. There is  no distension.     Palpations: Abdomen is soft.     Tenderness: There is abdominal tenderness in the suprapubic area and left lower quadrant. There is no guarding.  Musculoskeletal:        General: No deformity.     Cervical back: Normal range of motion and neck supple.  Skin:    General: Skin is warm and dry.  Neurological:     Mental Status: She is alert.     Cranial Nerves: No cranial nerve deficit.     Sensory: No sensory deficit.     Motor: No abnormal muscle tone.  Psychiatric:        Behavior: Behavior normal.     ED Results / Procedures / Treatments   Labs (all labs ordered are listed, but only abnormal results are displayed) Labs Reviewed  COMPREHENSIVE METABOLIC PANEL - Abnormal; Notable for the following components:      Result Value   Potassium 3.4 (*)    Glucose, Bld 124 (*)    AST 13 (*)    All other components within normal limits  CBC - Abnormal; Notable for the following components:   WBC 15.5 (*)    All other components within normal limits  URINALYSIS, ROUTINE W REFLEX MICROSCOPIC - Abnormal; Notable for the following components:   APPearance HAZY (*)    Hgb urine dipstick SMALL (*)    All other components within normal limits  SARS CORONAVIRUS 2 BY RT PCR (HOSPITAL ORDER, [facility])  LIPASE, BLOOD  POC URINE PREG, ED    EKG EKG Interpretation  Date/Time:  Sunday May 26 2020 16:14:46 EDT Ventricular Rate:  118 PR Interval:  166 QRS Duration: 80 QT Interval:  324 QTC Calculation: 454 R Axis:   11 Text Interpretation: Sinus tachycardia with occasional and consecutive  Premature ventricular complexes Low voltage QRS No sig change from prior ecg, No STEMI Some baseline artifact/motion arficat noted Confirmed by Octaviano Glow [phone]) on 05/26/2020 4:27:52 PM   Radiology DG Chest 2 View  Result Date: 05/26/2020 CLINICAL DATA:  LEFT shoulder pain EXAM: CHEST - 2 VIEW COMPARISON:  12/09/2018 FINDINGS: Trachea is midline. Cardiomediastinal contours and hilar structures are normal. Lungs are clear. No sign of pleural effusion. On limited assessment skeletal structures without acute process. IMPRESSION: No acute cardiopulmonary disease. Electronically Signed   By: Zetta Bills M.D.   On: 05/26/2020 17:45   CT ABDOMEN PELVIS W CONTRAST  Result Date: 05/26/2020 CLINICAL DATA:  Suspect diverticulitis. Nausea, vomiting, LEFT LOWER QUADRANT abdominal pain since last night. History of diverticulitis. EXAM: CT ABDOMEN AND PELVIS WITH CONTRAST TECHNIQUE: Multidetector CT imaging of the abdomen and pelvis was performed using the standard protocol following bolus administration of intravenous contrast. CONTRAST:  12mL OMNIPAQUE IOHEXOL 300 MG/ML  SOLN COMPARISON:  09/13/2016 FINDINGS: Lower chest: No acute abnormality. Hepatobiliary: Gallbladder is distended. No pericholecystic fluid. No intrahepatic biliary duct dilatation. Liver is unremarkable. Pancreas: Unremarkable. No pancreatic ductal dilatation or surrounding inflammatory changes. Spleen: Normal in size without focal abnormality. Adrenals/Urinary Tract: Adrenal glands are unremarkable. Kidneys are normal, without renal calculi, focal lesion, or hydronephrosis. Bladder is unremarkable. Stomach/Bowel: Stomach is normal in appearance. Small bowel loops are normal in caliber and wall thickness. There are numerous diverticula throughout the colon, especially involving the descending and sigmoid segments. There is moderate inflammatory change adjacent to the LOWER descending colon, in a segment of numerous diverticula. Suspect micro  perforation just MEDIAL to the LOWER descending colon. No  abscess or evidence for free intraperitoneal air. Appendectomy. Vascular/Lymphatic: No significant vascular findings are present. No enlarged abdominal or pelvic lymph nodes. Reproductive: Uterus is present. Intrauterine device is well positioned. Posterior calcified fibroid is 2.5 centimeters. The adnexal regions are unremarkable. Other: Small fat containing paraumbilical hernias. Musculoskeletal: Mild degenerative changes in the LOWER thoracic and lumbar spine. IMPRESSION: 1. Findings are consistent with acute diverticulitis involving the LOWER descending colon. Suspect micro perforation just MEDIAL to the LOWER descending colon. No abscess or free intraperitoneal air. 2. Distended gallbladder without evidence for acute cholecystitis. 3. Small fat containing paraumbilical hernias. 4. Intrauterine device. 5. Small calcified uterine fibroid. Electronically Signed   By: Nolon Nations M.D.   On: 05/26/2020 18:13    Procedures Procedures (including critical care time)  Medications Ordered in ED Medications  ondansetron (ZOFRAN) tablet 4 mg (has no administration in time range)    Or  ondansetron (ZOFRAN) injection 4 mg (has no administration in time range)  morphine 2 MG/ML injection 2 mg (has no administration in time range)  piperacillin-tazobactam (ZOSYN) IVPB 3.375 g (has no administration in time range)  HYDROmorphone (DILAUDID) injection 0.5 mg (0.5 mg Intravenous Given 05/26/20 1722)  ondansetron (ZOFRAN) injection 4 mg (4 mg Intravenous Given 05/26/20 1721)  iohexol (OMNIPAQUE) 300 MG/ML solution 100 mL (100 mLs Intravenous Contrast Given 05/26/20 1747)  piperacillin-tazobactam (ZOSYN) IVPB 3.375 g (0 g Intravenous Stopped 05/26/20 2022)  HYDROmorphone (DILAUDID) injection 1 mg (1 mg Intravenous Given 05/26/20 1901)  HYDROmorphone (DILAUDID) injection 1 mg (1 mg Intravenous Given 05/26/20 2024)  ondansetron (ZOFRAN) injection 4 mg (4 mg  Intravenous Given 05/26/20 2024)    ED Course  I have reviewed the triage vital signs and the nursing notes.  Pertinent labs & imaging results that were available during my care of the patient were reviewed by me and considered in my medical decision making (see chart for details).  Clinical Course as of May 26 2332  Sun May 26, 2020  1906 Per Dr. Constance Haw will start Zosyn, admit to medicine.    [EH]  2002 44 yo female presenting with diverticulitis, possible micro perforation noted on CT scan.  Patient has leukocytosis, afebrile and otherwise does not appear toxic on exam.  Admitted to medical service on antibiotics after discussion with general surgeon Dr Constance Haw   [MT]    Clinical Course User Index [EH] Lorin Glass, PA-C [MT] Langston Masker Carola Rhine, MD   MDM Rules/Calculators/A&P                         Patient is a 44 year old woman who presents today for evaluation of left lower quadrant abdominal pain with nausea and vomiting.  Here she is afebrile however appears to feel unwell.  On my exam her abdomen is tender to palpation primarily in the left lower quadrant.  Labs are obtained and reviewed, CBC shows leukocytosis at 15.5, CMP is unremarkable.  UA without evidence of infection.  Covid test is negative.  CT scan abdomen pelvis was obtained showing concern for acute diverticulitis in the lower descending colon with a suspected microperforation.  Patient is started on IV Zosyn.  Dr. Constance Haw is aware of patient, recommends medical admission, will see patient in the morning and consult.    I spoke with Hospitalist Dr. Scherrie November who will see patient for admission.   Note: Portions of this report may have been transcribed using voice recognition software. Every effort was made to ensure accuracy; however,  inadvertent computerized transcription errors may be present   Final Clinical Impression(s) / ED Diagnoses Final diagnoses:  Left lower quadrant abdominal pain  Diverticulitis     Rx / DC Orders ED Discharge Orders    None       Ollen Gross 05/26/20 2338    Wyvonnia Dusky, MD 05/27/20 1328

## 2020-05-26 NOTE — Progress Notes (Signed)
Pharmacy Antibiotic Note  Cynthia Ochoa is a 44 y.o. female admitted on 05/26/2020 with intra-abdominal infection, ?microperforation .  Pharmacy has been consulted for Zosyn dosing. WBC is elevated. Renal function good.   Plan: Zosyn 3.375G IV q8h to be infused over 4 hours Trend WBC, temp, renal function  F/U infectious work-up   Height: 5\' 7"  (170.2 cm) Weight: 121.6 kg (268 lb) IBW/kg (Calculated) : 61.6  Temp (24hrs), Avg:98.5 F (36.9 C), Min:98.4 F (36.9 C), Max:98.5 F (36.9 C)  Recent Labs  Lab 05/26/20 1634  WBC 15.5*  CREATININE 0.92    Estimated Creatinine Clearance: 108.7 mL/min (by C-G formula based on SCr of 0.92 mg/dL).    Allergies  Allergen Reactions  . Ciprofloxacin Other (See Comments)    Nerve pain. - IV only  . Morphine And Related Other (See Comments)    CHEST PRESSURE  . Adhesive [Tape] Rash    Narda Bonds, PharmD, BCPS Clinical Pharmacist Phone: [phone]

## 2020-05-26 NOTE — H&P (Signed)
History and Physical    Patient Demographics:    Cynthia Ochoa SWN:[id number] DOB: [birth year] DOA: 05/26/2020  PCP: Celene Squibb, MD  Patient coming from: Home  I have personally briefly reviewed patient's old medical records in [place]  Chief Complaint: Abdominal pain   Assessment & Plan:     Assessment/Plan Principal Problem:   Diverticulitis of intestine with perforation Active Problems:   Perforated intestine ([place])   Acute diverticulitis of intestine     Principal Problem: Acute diverticulitis with microperforation Patient presented with left lower quadrant abdominal pain starting yesterday associated with nausea and multiple episodes of vomiting today.  CT shows acute diverticulitis involving lower descending colon with possible microperforation.  Noted to prior episodes of diverticulitis in 2017 and 2018, the second episode was associated with contained perforation.  Has been seen by surgery as outpatient and he recommended conservative management in the past.  History of 3 colonoscopies in the past. -We will keep n.p.o. -Pain meds as needed -IV fluid resuscitation -We will empirically cover with IV Zosyn -General surgery consult in a.m.  Other Active Problems:     DVT prophylaxis: Lovenox Code Status:  Full code Family Communication: N/A  Disposition Plan: Admitted as inpatient for diverticulitis with microperforation.  Plan for general surgery consult in a.m.,  Consults called: N/A Admission status: Admitted as inpatient, expect 2 to 3-day inpatient stay    HPI:     HPI: Cynthia Ochoa is a 44 y.o. female with medical history significant of anxiety who presented to the ER with left-sided abdominal pain.  Patient reported having left lower quadrant abdominal pain starting 4 PM last night.  Patient states she had continuous and persistent pain all night and then today she started having nausea with multiple episodes of vomiting.  No hematemesis,  melena, hematochezia reported.  She has a history of 2 prior episodes of diverticulitis in 2017 and 2018. No fever, chills, cough, shortness of breath, dizziness, lightheadedness, seizures, syncope. ED Course:  Vital Signs reviewed on presentation, significant for temperature 98.5, heart rate 95, blood pressure 116/70, saturation 95% on room air. Labs reviewed, significant for sodium 137, potassium 3.4, BUN 10, creatinine 0.9, LFTs within normal limits, WBC count 15.5, hemoglobin 14.8, hematocrit 44, platelets 234.  Pregnancy test is negative.  SARS Covid RT-PCR is negative.  Urinalysis is negative. Imaging personally Reviewed, CT of the abdomen shows findings consistent with acute diverticulitis involving the lower descending colon.  Suspect microperforation just medial to the lower descending colon.  No abscess or free intraperitoneal air.  Distended gallbladder with no evidence of cholecystitis.  Intrauterine device is noted.  Chest x-ray shows no acute cardiopulmonary disease. EKG personally reviewed, shows sinus tachycardia, no acute ST-T changes.    Review of systems:    Review of Systems: As per HPI otherwise 10 point review of systems negative.  All other review of systems is negative except the ones noted above in the HPI.    Past Medical and Surgical History:  Reviewed by me  Past Medical History:  Diagnosis Date  . Abnormal Pap smear   . Diverticulitis   . Fibroid   . Hx of campylobacteriosis 2005  . Hx of Salmonella gastroenteritis   . IBS (irritable bowel syndrome)     Past Surgical History:  Procedure Laterality Date  . CERVICAL CONIZATION W/BX N/A 09/07/2013   Procedure: CONIZATION CERVIX WITH BIOPSY/REMOVAL OF IUD AND INSERTION;  Surgeon: Cyril Mourning, MD;  Location: [facility];  Service: Gynecology;  Laterality: N/A;  . COLONOSCOPY  2005 NUR   I/E HEMORRHOIDS, TIC AT HF, NL TI  . COLONOSCOPY N/A 12/27/2015   Procedure: COLONOSCOPY;  Surgeon: Danie Binder, MD;   Location: [facility];  Service: Endoscopy;  Laterality: N/A;  0830  . COLPOSCOPY    . CYSTECTOMY     abdomen  . GYNECOLOGIC CRYOSURGERY    . LAPAROSCOPIC APPENDECTOMY N/A 09/14/2016   Procedure: APPENDECTOMY LAPAROSCOPIC;  Surgeon: Aviva Signs, MD;  Location: [facility];  Service: General;  Laterality: N/A;  . VAGINAL DELIVERY  04/07/13  . WISDOM TOOTH EXTRACTION       Social History:  Reviewed by me   reports that she has never smoked. She has never used smokeless tobacco. She reports that she does not drink alcohol and does not use drugs.  Allergies:    Allergies  Allergen Reactions  . Ciprofloxacin Other (See Comments)    Nerve pain. - IV only  . Morphine And Related Other (See Comments)    CHEST PRESSURE  . Adhesive [Tape] Rash    Family History :   Family History  Problem Relation Age of Onset  . Hypertension Mother   . Hypertension Father   . Melanoma Paternal Uncle   . Prader-Willi syndrome Brother   . Diabetes Brother   . Hypertension Brother   . COPD Maternal Grandfather   . COPD Paternal Grandmother    Family history reviewed, noted as above, not pertinent to current presentation.   Home Medications:    Prior to Admission medications   Medication Sig Start Date End Date Taking? Authorizing Provider  Albuterol Sulfate (PROAIR RESPICLICK) 160 (90 Base) MCG/ACT AEPB Inhale 1-2 puffs into the lungs every 6 (six) hours as needed. 10/28/18  Yes Rasch, Artist Pais, NP  ALPRAZolam Duanne Moron) 0.5 MG tablet Take 0.5 mg by mouth 2 (two) times daily as needed for anxiety. 05/20/20  Yes [provider]  buPROPion (WELLBUTRIN SR) 100 MG 12 hr tablet Take 100 mg by mouth every morning. 01/04/20  Yes [provider]  Diethylpropion HCl CR 75 MG TB24 Take 1 tablet by mouth daily. 04/16/20  Yes [provider]  escitalopram (LEXAPRO) 10 MG tablet Take 10 mg by mouth daily.   Yes [provider]    Physical Exam:    Physical Exam: Vitals:    05/26/20 1930 05/26/20 1959 05/26/20 2128 05/26/20 2130  BP: 134/76 (!) 145/78 123/67 116/70  Pulse: 96 91 96 95  Resp: 20     Temp:      TempSrc:      SpO2: 94% 97% 94% 95%  Weight:      Height:        Constitutional: NAD, calm, comfortable Vitals:   05/26/20 1930 05/26/20 1959 05/26/20 2128 05/26/20 2130  BP: 134/76 (!) 145/78 123/67 116/70  Pulse: 96 91 96 95  Resp: 20     Temp:      TempSrc:      SpO2: 94% 97% 94% 95%  Weight:      Height:       Eyes: PERRL, lids and conjunctivae normal ENMT: Mucous membranes are moist. Posterior pharynx clear of any exudate or lesions.Normal dentition.  Neck: normal, supple, no masses, no thyromegaly Respiratory: clear to auscultation bilaterally, no wheezing, no crackles. Normal respiratory effort. No accessory muscle use.  Cardiovascular: Regular rate and rhythm, no murmurs / rubs / gallops. No extremity edema. 2+ pedal pulses. No carotid bruits.  Abdomen:  Significant left-sided abdominal tenderness with mild guarding, no masses palpated. No hepatosplenomegaly. Bowel sounds positive.  Musculoskeletal: no clubbing / cyanosis. No joint deformity upper and lower extremities. Good ROM, no contractures. Normal muscle tone.  Skin: no rashes, lesions, ulcers. No induration Neurologic: CN 2-12 grossly intact. Sensation intact, DTR normal. Strength 5/5 in all 4.  Psychiatric: Normal judgment and insight. Alert and oriented x 3. Normal mood.    Decubitus Ulcers: Not present on admission Catheters and tubes: None  Data Review:    Labs on Admission: I have personally reviewed following labs and imaging studies  CBC: Recent Labs  Lab 05/26/20 1634  WBC 15.5*  HGB 14.8  HCT 44.9  MCV 90.2  PLT 409   Basic Metabolic Panel: Recent Labs  Lab 05/26/20 1634  NA 137  K 3.4*  CL 105  CO2 25  GLUCOSE 124*  BUN 10  CREATININE 0.92  CALCIUM 8.9   GFR: Estimated Creatinine Clearance: 108.7 mL/min (by C-G formula based on SCr of 0.92  mg/dL). Liver Function Tests: Recent Labs  Lab 05/26/20 1634  AST 13*  ALT 19  ALKPHOS 69  BILITOT 0.7  PROT 7.2  ALBUMIN 4.1   Recent Labs  Lab 05/26/20 1634  LIPASE 21   No results for input(s): AMMONIA in the last 168 hours. Coagulation Profile: No results for input(s): INR, PROTIME in the last 168 hours. Cardiac Enzymes: No results for input(s): CKTOTAL, CKMB, CKMBINDEX, TROPONINI in the last 168 hours. BNP (last 3 results) No results for input(s): PROBNP in the last 8760 hours. HbA1C: No results for input(s): HGBA1C in the last 72 hours. CBG: No results for input(s): GLUCAP in the last 168 hours. Lipid Profile: No results for input(s): CHOL, HDL, LDLCALC, TRIG, CHOLHDL, LDLDIRECT in the last 72 hours. Thyroid Function Tests: No results for input(s): TSH, T4TOTAL, FREET4, T3FREE, THYROIDAB in the last 72 hours. Anemia Panel: No results for input(s): VITAMINB12, FOLATE, FERRITIN, TIBC, IRON, RETICCTPCT in the last 72 hours. Urine analysis:    Component Value Date/Time   COLORURINE YELLOW 05/26/2020 1623   APPEARANCEUR HAZY (A) 05/26/2020 1623   LABSPEC 1.027 05/26/2020 1623   PHURINE 5.0 05/26/2020 1623   GLUCOSEU NEGATIVE 05/26/2020 1623   HGBUR SMALL (A) 05/26/2020 1623   BILIRUBINUR NEGATIVE 05/26/2020 1623   KETONESUR NEGATIVE 05/26/2020 1623   PROTEINUR NEGATIVE 05/26/2020 1623   UROBILINOGEN 4.0 (H) 12/27/2010 2045   NITRITE NEGATIVE 05/26/2020 1623   LEUKOCYTESUR NEGATIVE 05/26/2020 1623     Imaging Results:      Radiological Exams on Admission: DG Chest 2 View  Result Date: 05/26/2020 CLINICAL DATA:  LEFT shoulder pain EXAM: CHEST - 2 VIEW COMPARISON:  12/09/2018 FINDINGS: Trachea is midline. Cardiomediastinal contours and hilar structures are normal. Lungs are clear. No sign of pleural effusion. On limited assessment skeletal structures without acute process. IMPRESSION: No acute cardiopulmonary disease. Electronically Signed   By: Zetta Bills  M.D.   On: 05/26/2020 17:45   CT ABDOMEN PELVIS W CONTRAST  Result Date: 05/26/2020 CLINICAL DATA:  Suspect diverticulitis. Nausea, vomiting, LEFT LOWER QUADRANT abdominal pain since last night. History of diverticulitis. EXAM: CT ABDOMEN AND PELVIS WITH CONTRAST TECHNIQUE: Multidetector CT imaging of the abdomen and pelvis was performed using the standard protocol following bolus administration of intravenous contrast. CONTRAST:  141mL OMNIPAQUE IOHEXOL 300 MG/ML  SOLN COMPARISON:  09/13/2016 FINDINGS: Lower chest: No acute abnormality. Hepatobiliary: Gallbladder is distended. No pericholecystic fluid. No intrahepatic biliary duct dilatation. Liver is unremarkable. Pancreas:  Unremarkable. No pancreatic ductal dilatation or surrounding inflammatory changes. Spleen: Normal in size without focal abnormality. Adrenals/Urinary Tract: Adrenal glands are unremarkable. Kidneys are normal, without renal calculi, focal lesion, or hydronephrosis. Bladder is unremarkable. Stomach/Bowel: Stomach is normal in appearance. Small bowel loops are normal in caliber and wall thickness. There are numerous diverticula throughout the colon, especially involving the descending and sigmoid segments. There is moderate inflammatory change adjacent to the LOWER descending colon, in a segment of numerous diverticula. Suspect micro perforation just MEDIAL to the LOWER descending colon. No abscess or evidence for free intraperitoneal air. Appendectomy. Vascular/Lymphatic: No significant vascular findings are present. No enlarged abdominal or pelvic lymph nodes. Reproductive: Uterus is present. Intrauterine device is well positioned. Posterior calcified fibroid is 2.5 centimeters. The adnexal regions are unremarkable. Other: Small fat containing paraumbilical hernias. Musculoskeletal: Mild degenerative changes in the LOWER thoracic and lumbar spine. IMPRESSION: 1. Findings are consistent with acute diverticulitis involving the LOWER descending  colon. Suspect micro perforation just MEDIAL to the LOWER descending colon. No abscess or free intraperitoneal air. 2. Distended gallbladder without evidence for acute cholecystitis. 3. Small fat containing paraumbilical hernias. 4. Intrauterine device. 5. Small calcified uterine fibroid. Electronically Signed   By: Nolon Nations M.D.   On: 05/26/2020 18:13      Brigg Cape Ginette Otto MD [facility]  If 7PM-7AM, please contact night-coverage   05/26/2020, 9:54 PM

## 2020-05-26 NOTE — ED Notes (Signed)
Patient with c/o pain and nausea. RN reached out to MD for new orders. Awaiting MD response.

## 2020-05-26 NOTE — ED Triage Notes (Signed)
Patient comes to the ED after abdominal pain for one night, vomiting since she got up today.

## 2020-05-27 DIAGNOSIS — K631 Perforation of intestine (nontraumatic): Secondary | ICD-10-CM

## 2020-05-27 DIAGNOSIS — K5700 Diverticulitis of small intestine with perforation and abscess without bleeding: Secondary | ICD-10-CM

## 2020-05-27 DIAGNOSIS — K5792 Diverticulitis of intestine, part unspecified, without perforation or abscess without bleeding: Secondary | ICD-10-CM

## 2020-05-27 LAB — CBC
HCT: 41 % (ref 36.0–46.0)
Hemoglobin: 13.4 g/dL (ref 12.0–15.0)
MCH: 29.4 pg (ref 26.0–34.0)
MCHC: 32.7 g/dL (ref 30.0–36.0)
MCV: 89.9 fL (ref 80.0–100.0)
Platelets: 232 10*3/uL (ref 150–400)
RBC: 4.56 MIL/uL (ref 3.87–5.11)
RDW: 12.9 % (ref 11.5–15.5)
WBC: 13.7 10*3/uL — ABNORMAL HIGH (ref 4.0–10.5)
nRBC: 0 % (ref 0.0–0.2)

## 2020-05-27 LAB — HIV ANTIBODY (ROUTINE TESTING W REFLEX): HIV Screen 4th Generation wRfx: NONREACTIVE

## 2020-05-27 LAB — BASIC METABOLIC PANEL
Anion gap: 9 (ref 5–15)
BUN: 7 mg/dL (ref 6–20)
CO2: 25 mmol/L (ref 22–32)
Calcium: 8.7 mg/dL — ABNORMAL LOW (ref 8.9–10.3)
Chloride: 103 mmol/L (ref 98–111)
Creatinine, Ser: 0.85 mg/dL (ref 0.44–1.00)
GFR calc Af Amer: >60 mL/min (ref >60)
GFR calc non Af Amer: >60 mL/min (ref >60)
Glucose, Bld: 138 mg/dL — ABNORMAL HIGH (ref 70–99)
Potassium: 3.6 mmol/L (ref 3.5–5.1)
Sodium: 137 mmol/L (ref 135–145)

## 2020-05-27 MED ORDER — ESCITALOPRAM OXALATE 10 MG PO TABS
15.0000 mg | ORAL_TABLET | Freq: Every day | ORAL | Status: DC
  Administered 2020-05-28 – 2020-05-29 (×2): 15 mg via ORAL
  Filled 2020-05-27 (×2): qty 2

## 2020-05-27 MED ORDER — TRAZODONE HCL 50 MG PO TABS
100.0000 mg | ORAL_TABLET | Freq: Every day | ORAL | Status: DC
  Administered 2020-05-27 – 2020-05-28 (×2): 100 mg via ORAL
  Filled 2020-05-27 (×2): qty 2

## 2020-05-27 MED ORDER — HYDROMORPHONE HCL 1 MG/ML IJ SOLN
1.0000 mg | Freq: Once | INTRAMUSCULAR | Status: AC
  Administered 2020-05-27: 1 mg via INTRAVENOUS
  Filled 2020-05-27: qty 1

## 2020-05-27 MED ORDER — HYDROMORPHONE HCL 1 MG/ML IJ SOLN
1.0000 mg | INTRAMUSCULAR | Status: DC | PRN
  Administered 2020-05-27 (×4): 1 mg via INTRAVENOUS
  Filled 2020-05-27 (×3): qty 1

## 2020-05-27 MED ORDER — PROMETHAZINE HCL 25 MG/ML IJ SOLN
12.5000 mg | Freq: Four times a day (QID) | INTRAMUSCULAR | Status: DC | PRN
  Administered 2020-05-27 (×3): 12.5 mg via INTRAVENOUS
  Filled 2020-05-27 (×4): qty 1

## 2020-05-27 MED ORDER — OXYCODONE HCL 5 MG PO TABS
5.0000 mg | ORAL_TABLET | ORAL | Status: DC | PRN
  Administered 2020-05-27 – 2020-05-29 (×6): 10 mg via ORAL
  Filled 2020-05-27 (×6): qty 2

## 2020-05-27 MED ORDER — HYDROMORPHONE HCL 1 MG/ML IJ SOLN
0.5000 mg | INTRAMUSCULAR | Status: DC | PRN
  Filled 2020-05-27 (×2): qty 0.5

## 2020-05-27 NOTE — Progress Notes (Signed)
[facility] Progress Note     Subjective: Pain about the same, wants to have more to drink. Leukocytosis down.   Objective: Vital signs in last 24 hours: Temp:  [97.7 F (36.5 C)-98.5 F (36.9 C)] 98.5 F (36.9 C) (07/05 0548) Pulse Rate:  [91-107] 91 (07/05 0548) Resp:  [16-20] 18 (07/05 0548) BP: (116-147)/(67-89) 125/84 (07/05 0548) SpO2:  [94 %-98 %] 97 % (07/05 0548) Weight:  [121.6 kg] 121.6 kg (07/04 1611) Last BM Date: 05/26/20  Intake/Output from previous day: 07/04 0701 - 07/05 0700 In: 0.5 [I.V.:0.5] Out: -  Intake/Output this shift: No intake/output data recorded.  General appearance: alert, cooperative and no distress Resp: normal work of breathing GI: soft, nondistended, tender in the left flank/side, non tender lower abdomen  Lab Results:  Recent Labs    05/26/20 1634 05/27/20 0543  WBC 15.5* 13.7*  HGB 14.8 13.4  HCT 44.9 41.0  PLT 234 232   BMET Recent Labs    05/26/20 1634 05/27/20 0543  NA 137 137  K 3.4* 3.6  CL 105 103  CO2 25 25  GLUCOSE 124* 138*  BUN 10 7  CREATININE 0.92 0.85  CALCIUM 8.9 8.7*   PT/INR No results for input(s): LABPROT, INR in the last 72 hours.  Studies/Results: DG Chest 2 View  Result Date: 05/26/2020 CLINICAL DATA:  LEFT shoulder pain EXAM: CHEST - 2 VIEW COMPARISON:  12/09/2018 FINDINGS: Trachea is midline. Cardiomediastinal contours and hilar structures are normal. Lungs are clear. No sign of pleural effusion. On limited assessment skeletal structures without acute process. IMPRESSION: No acute cardiopulmonary disease. Electronically Signed   By: Zetta Bills M.D.   On: 05/26/2020 17:45   CT ABDOMEN PELVIS W CONTRAST  Result Date: 05/26/2020 CLINICAL DATA:  Suspect diverticulitis. Nausea, vomiting, LEFT LOWER QUADRANT abdominal pain since last night. History of diverticulitis. EXAM: CT ABDOMEN AND PELVIS WITH CONTRAST TECHNIQUE: Multidetector CT imaging of the abdomen and pelvis was  performed using the standard protocol following bolus administration of intravenous contrast. CONTRAST:  139mL OMNIPAQUE IOHEXOL 300 MG/ML  SOLN COMPARISON:  09/13/2016 FINDINGS: Lower chest: No acute abnormality. Hepatobiliary: Gallbladder is distended. No pericholecystic fluid. No intrahepatic biliary duct dilatation. Liver is unremarkable. Pancreas: Unremarkable. No pancreatic ductal dilatation or surrounding inflammatory changes. Spleen: Normal in size without focal abnormality. Adrenals/Urinary Tract: Adrenal glands are unremarkable. Kidneys are normal, without renal calculi, focal lesion, or hydronephrosis. Bladder is unremarkable. Stomach/Bowel: Stomach is normal in appearance. Small bowel loops are normal in caliber and wall thickness. There are numerous diverticula throughout the colon, especially involving the descending and sigmoid segments. There is moderate inflammatory change adjacent to the LOWER descending colon, in a segment of numerous diverticula. Suspect micro perforation just MEDIAL to the LOWER descending colon. No abscess or evidence for free intraperitoneal air. Appendectomy. Vascular/Lymphatic: No significant vascular findings are present. No enlarged abdominal or pelvic lymph nodes. Reproductive: Uterus is present. Intrauterine device is well positioned. Posterior calcified fibroid is 2.5 centimeters. The adnexal regions are unremarkable. Other: Small fat containing paraumbilical hernias. Musculoskeletal: Mild degenerative changes in the LOWER thoracic and lumbar spine. IMPRESSION: 1. Findings are consistent with acute diverticulitis involving the LOWER descending colon. Suspect micro perforation just MEDIAL to the LOWER descending colon. No abscess or free intraperitoneal air. 2. Distended gallbladder without evidence for acute cholecystitis. 3. Small fat containing paraumbilical hernias. 4. Intrauterine device. 5. Small calcified uterine fibroid. Electronically Signed   By: Nolon Nations M.D.   On: 05/26/2020 18:13  Anti-infectives: Anti-infectives (From admission, onward)   Start     Dose/Rate Route Frequency Ordered Stop   05/27/20 0300  piperacillin-tazobactam (ZOSYN) IVPB 3.375 g     Discontinue     3.375 g 12.5 mL/hr over 240 Minutes Intravenous Every 8 hours 05/26/20 2252     05/26/20 1830  piperacillin-tazobactam (ZOSYN) IVPB 3.375 g        3.375 g 100 mL/hr over 30 Minutes Intravenous  Once 05/26/20 1825 05/26/20 2022      Assessment/Plan: Cynthia Ochoa is a 44 yo with microperforation of the descending  Colon.  PRN for pain, added roxicodone Clear diet IV zosyn for at least 3 days inpatient and will switch to augmentin Will continue to discuss if she needs to plan for elective colectomy as outpatient  Last colonoscopy in 2017 and had diverticula in the transverse to descending colon    LOS: 1 day    Virl Cagey 05/27/2020

## 2020-05-27 NOTE — Plan of Care (Signed)

## 2020-05-27 NOTE — Progress Notes (Signed)
Patient Demographics:    Cynthia Ochoa, is a 44 y.o. female, DOB - [birth year], VQQ:[id number]  Admit date - 05/26/2020   Admitting Physician Lynetta Mare, MD  Outpatient Primary MD for the patient is Celene Squibb, MD  LOS - 1   Chief Complaint  Patient presents with  . Abdominal Pain        Subjective:    Cynthia Ochoa today has no fevers, ,  No chest pain,  -- Abdominal pain, nausea persist -No appetite at all as per patient  Assessment  & Plan :    Principal Problem:   Diverticulitis of intestine with perforation Active Problems:   Perforated intestine ([place])   Acute diverticulitis of intestine   Acute diverticulitis  Brief Summary:- 44 y.o. female with medical history significant of anxiety , morbid obesity and recurrent diverticulitis admitted on 05/26/2020 and found to have acute diverticulitis with microperforation  A/p 1) acute diverticulitis with microperforation--- patient with recurrent episodes of diverticulitis, first episode in 2017, second episode in 2018 was associated with contained perforation -Treat empirically with IV Zosyn, as needed antiemetics as needed opiates -Sips and ice chips -IV fluids until able to take adequate oral intake -Surgical consult requested -WBC is down to 13.7 from 15.5  2)Morbid Obesity- -Low calorie diet, portion control and increase physical activity discussed with patient -Body mass index is 41.97 kg/m.  3) anxiety--- continue Lexapro 15 mg daily, Xanax as needed  Disposition/Need for in-Hospital Stay- patient unable to be discharged at this time due to -acute diverticulitis with microperforation requiring IV antibiotics and IV fluids until oral intake is reliable  Status is: Inpatient  Remains inpatient appropriate because:acute diverticulitis with microperforation requiring IV antibiotics and IV fluids until oral intake is  reliable   Disposition: The patient is from: Home              Anticipated d/c is to: Home              Anticipated d/c date is: 2 days              Patient currently is not medically stable to d/c. Barriers: Not Clinically Stable- acute diverticulitis with microperforation requiring IV antibiotics and IV fluids until oral intake is reliable  Code Status : Full code  Family Communication:   (patient is alert, awake and coherent)   Consults  : General surgery  DVT Prophylaxis  :  Lovenox -  SCDs   Lab Results  Component Value Date   PLT 232 05/27/2020    Inpatient Medications  Scheduled Meds: . enoxaparin (LOVENOX) injection  60 mg Subcutaneous QHS  . [START ON 05/28/2020] escitalopram  15 mg Oral Daily  . sodium chloride flush  3 mL Intravenous Q12H  . traZODone  100 mg Oral QHS   Continuous Infusions: . sodium chloride    . lactated ringers 125 mL/hr at 05/27/20 0030  . piperacillin-tazobactam (ZOSYN)  IV 3.375 g (05/27/20 0546)   PRN Meds:.sodium chloride, acetaminophen **OR** acetaminophen, albuterol, ALPRAZolam, bisacodyl, HYDROmorphone (DILAUDID) injection, HYDROmorphone (DILAUDID) injection, ondansetron **OR** ondansetron (ZOFRAN) IV, polyethylene glycol, promethazine, sodium chloride flush    Anti-infectives (From admission, onward)   Start     Dose/Rate Route Frequency Ordered Stop  05/27/20 0300  piperacillin-tazobactam (ZOSYN) IVPB 3.375 g     Discontinue     3.375 g 12.5 mL/hr over 240 Minutes Intravenous Every 8 hours 05/26/20 2252     05/26/20 1830  piperacillin-tazobactam (ZOSYN) IVPB 3.375 g        3.375 g 100 mL/hr over 30 Minutes Intravenous  Once 05/26/20 1825 05/26/20 2022        Objective:   Vitals:   05/26/20 2200 05/26/20 2201 05/27/20 0001 05/27/20 0548  BP: (!) 141/71  (!) 142/89 125/84  Pulse:  97 95 91  Resp:    18  Temp:   97.7 F (36.5 C) 98.5 F (36.9 C)  TempSrc:   Oral Oral  SpO2:  95% 97% 97%  Weight:      Height:         Wt Readings from Last 3 Encounters:  05/26/20 121.6 kg  01/12/19 121.6 kg  10/28/18 124.5 kg     Intake/Output Summary (Last 24 hours) at 05/27/2020 1125 Last data filed at 05/27/2020 0300 Gross per 24 hour  Intake 0.45 ml  Output --  Net 0.45 ml     Physical Exam  Gen:- Awake Alert, morbidly obese, in no acute distress HEENT:- [place].AT, No sclera icterus Neck-Supple Neck,No JVD,.  Lungs-  CTAB , fair symmetrical air movement CV- S1, S2 normal, regular Abd-  +ve B.Sounds, Abd Soft, left lower quadrant and flank area tenderness, no significant guarding, slight rebound noted Extremity/Skin:- No  edema, pedal pulses present  Psych-affect is appropriate, oriented x3 Neuro-no new focal deficits, no tremors   Data Review:   Micro Results Recent Results (from the past 240 hour(s))  SARS Coronavirus 2 by RT PCR (hospital order, performed in [facility] hospital lab) Nasopharyngeal Nasopharyngeal Swab     Status: None   Collection Time: 05/26/20  7:02 PM   Specimen: Nasopharyngeal Swab  Result Value Ref Range Status   SARS Coronavirus 2 NEGATIVE NEGATIVE Final    Comment: (NOTE) SARS-CoV-2 target nucleic acids are NOT DETECTED.  The SARS-CoV-2 RNA is generally detectable in upper and lower respiratory specimens during the acute phase of infection. The lowest concentration of SARS-CoV-2 viral copies this assay can detect is 250 copies / mL. A negative result does not preclude SARS-CoV-2 infection and should not be used as the sole basis for treatment or other patient management decisions.  A negative result may occur with improper specimen collection / handling, submission of specimen other than nasopharyngeal swab, presence of viral mutation(s) within the areas targeted by this assay, and inadequate number of viral copies (<250 copies / mL). A negative result must be combined with clinical observations, patient history, and epidemiological information.  Fact Sheet for  Patients:   StrictlyIdeas.no  Fact Sheet for Healthcare Providers: BankingDealers.co.za  This test is not yet approved or  cleared by the [place] FDA and has been authorized for detection and/or diagnosis of SARS-CoV-2 by FDA under an Emergency Use Authorization (EUA).  This EUA will remain in effect (meaning this test can be used) for the duration of the COVID-19 declaration under Section 564(b)(1) of the Act, 21 U.S.C. section 360bbb-3(b)(1), unless the authorization is terminated or revoked sooner.  Performed at [facility], [address], [place], [place] [address]     Radiology Reports DG Chest 2 View  Result Date: 05/26/2020 CLINICAL DATA:  LEFT shoulder pain EXAM: CHEST - 2 VIEW COMPARISON:  12/09/2018 FINDINGS: Trachea is midline. Cardiomediastinal contours and hilar structures are normal.  Lungs are clear. No sign of pleural effusion. On limited assessment skeletal structures without acute process. IMPRESSION: No acute cardiopulmonary disease. Electronically Signed   By: Zetta Bills M.D.   On: 05/26/2020 17:45   CT ABDOMEN PELVIS W CONTRAST  Result Date: 05/26/2020 CLINICAL DATA:  Suspect diverticulitis. Nausea, vomiting, LEFT LOWER QUADRANT abdominal pain since last night. History of diverticulitis. EXAM: CT ABDOMEN AND PELVIS WITH CONTRAST TECHNIQUE: Multidetector CT imaging of the abdomen and pelvis was performed using the standard protocol following bolus administration of intravenous contrast. CONTRAST:  114mL OMNIPAQUE IOHEXOL 300 MG/ML  SOLN COMPARISON:  09/13/2016 FINDINGS: Lower chest: No acute abnormality. Hepatobiliary: Gallbladder is distended. No pericholecystic fluid. No intrahepatic biliary duct dilatation. Liver is unremarkable. Pancreas: Unremarkable. No pancreatic ductal dilatation or surrounding inflammatory changes. Spleen: Normal in size without focal abnormality. Adrenals/Urinary Tract: Adrenal glands are  unremarkable. Kidneys are normal, without renal calculi, focal lesion, or hydronephrosis. Bladder is unremarkable. Stomach/Bowel: Stomach is normal in appearance. Small bowel loops are normal in caliber and wall thickness. There are numerous diverticula throughout the colon, especially involving the descending and sigmoid segments. There is moderate inflammatory change adjacent to the LOWER descending colon, in a segment of numerous diverticula. Suspect micro perforation just MEDIAL to the LOWER descending colon. No abscess or evidence for free intraperitoneal air. Appendectomy. Vascular/Lymphatic: No significant vascular findings are present. No enlarged abdominal or pelvic lymph nodes. Reproductive: Uterus is present. Intrauterine device is well positioned. Posterior calcified fibroid is 2.5 centimeters. The adnexal regions are unremarkable. Other: Small fat containing paraumbilical hernias. Musculoskeletal: Mild degenerative changes in the LOWER thoracic and lumbar spine. IMPRESSION: 1. Findings are consistent with acute diverticulitis involving the LOWER descending colon. Suspect micro perforation just MEDIAL to the LOWER descending colon. No abscess or free intraperitoneal air. 2. Distended gallbladder without evidence for acute cholecystitis. 3. Small fat containing paraumbilical hernias. 4. Intrauterine device. 5. Small calcified uterine fibroid. Electronically Signed   By: Nolon Nations M.D.   On: 05/26/2020 18:13     CBC Recent Labs  Lab 05/26/20 1634 05/27/20 0543  WBC 15.5* 13.7*  HGB 14.8 13.4  HCT 44.9 41.0  PLT 234 232  MCV 90.2 89.9  MCH 29.7 29.4  MCHC 33.0 32.7  RDW 12.7 12.9    Chemistries  Recent Labs  Lab 05/26/20 1634 05/27/20 0543  NA 137 137  K 3.4* 3.6  CL 105 103  CO2 25 25  GLUCOSE 124* 138*  BUN 10 7  CREATININE 0.92 0.85  CALCIUM 8.9 8.7*  AST 13*  --   ALT 19  --   ALKPHOS 69  --   BILITOT 0.7  --     ------------------------------------------------------------------------------------------------------------------ No results for input(s): CHOL, HDL, LDLCALC, TRIG, CHOLHDL, LDLDIRECT in the last 72 hours.  No results found for: HGBA1C ------------------------------------------------------------------------------------------------------------------ No results for input(s): TSH, T4TOTAL, T3FREE, THYROIDAB in the last 72 hours.  Invalid input(s): FREET3 ------------------------------------------------------------------------------------------------------------------ No results for input(s): VITAMINB12, FOLATE, FERRITIN, TIBC, IRON, RETICCTPCT in the last 72 hours.  Coagulation profile No results for input(s): INR, PROTIME in the last 168 hours.  No results for input(s): DDIMER in the last 72 hours.  Cardiac Enzymes No results for input(s): CKMB, TROPONINI, MYOGLOBIN in the last 168 hours.  Invalid input(s): CK ------------------------------------------------------------------------------------------------------------------ No results found for: BNP   Roxan Hockey M.D on 05/27/2020 at 11:25 AM  Go to [email or web address] - for contact info  [facility] - Office  [phone]

## 2020-05-28 LAB — CBC WITH DIFFERENTIAL/PLATELET
Abs Immature Granulocytes: 0.02 10*3/uL (ref 0.00–0.07)
Basophils Absolute: 0.1 10*3/uL (ref 0.0–0.1)
Basophils Relative: 1 %
Eosinophils Absolute: 0.2 10*3/uL (ref 0.0–0.5)
Eosinophils Relative: 2 %
HCT: 40.4 % (ref 36.0–46.0)
Hemoglobin: 13.2 g/dL (ref 12.0–15.0)
Immature Granulocytes: 0 %
Lymphocytes Relative: 19 %
Lymphs Abs: 2 10*3/uL (ref 0.7–4.0)
MCH: 29.9 pg (ref 26.0–34.0)
MCHC: 32.7 g/dL (ref 30.0–36.0)
MCV: 91.4 fL (ref 80.0–100.0)
Monocytes Absolute: 1 10*3/uL (ref 0.1–1.0)
Monocytes Relative: 10 %
Neutro Abs: 6.9 10*3/uL (ref 1.7–7.7)
Neutrophils Relative %: 68 %
Platelets: 227 10*3/uL (ref 150–400)
RBC: 4.42 MIL/uL (ref 3.87–5.11)
RDW: 12.8 % (ref 11.5–15.5)
WBC: 10.1 10*3/uL (ref 4.0–10.5)
nRBC: 0 % (ref 0.0–0.2)

## 2020-05-28 MED ORDER — DOCUSATE SODIUM 100 MG PO CAPS
100.0000 mg | ORAL_CAPSULE | Freq: Two times a day (BID) | ORAL | Status: DC
  Administered 2020-05-28 – 2020-05-29 (×2): 100 mg via ORAL
  Filled 2020-05-28 (×2): qty 1

## 2020-05-28 MED ORDER — LORATADINE 10 MG PO TABS
10.0000 mg | ORAL_TABLET | Freq: Every day | ORAL | Status: DC
  Administered 2020-05-28 – 2020-05-29 (×2): 10 mg via ORAL
  Filled 2020-05-28 (×2): qty 1

## 2020-05-28 NOTE — Progress Notes (Signed)
Patient Demographics:    Cynthia Ochoa, is a 44 y.o. female, DOB - February 19, 1976, [id number]  Admit date - 05/26/2020   Admitting Physician Lynetta Mare, MD  Outpatient Primary MD for the patient is Celene Squibb, MD  LOS - 2   Chief Complaint  Patient presents with  . Abdominal Pain        Subjective:    [name] today has no fevers, ,  No chest pain,  -- -tolerating clear liquid diet, but not really drinking much today -Voiding okay, passing gas but no BM -Abdominal tenderness persist  -Patient is pleasant and cooperative, trying to ambulate   Assessment  & Plan :    Principal Problem:   Diverticulitis of intestine with perforation Active Problems:   Perforated intestine ([place])   Acute diverticulitis of intestine   Acute diverticulitis  Brief Summary:- 44 y.o. female with medical history significant of anxiety , morbid obesity and recurrent diverticulitis admitted on 05/26/2020 and found to have acute diverticulitis with microperforation  A/p 1) acute diverticulitis with microperforation--- patient with recurrent episodes of diverticulitis, first episode in 2017, second episode in 2018 was associated with contained perforation -Continue IV Zosyn, as needed antiemetics as needed opiates -Advance to full liquid diet if tolerated -IV fluids until able to take adequate oral intake -Surgical consult appreciated -WBC is down to 10.1 from 15.5  2)Morbid Obesity- -Low calorie diet, portion control and increase physical activity discussed with patient --Prior to admission patient actually has been trying a diet high in nuts -Body mass index is 41.97 kg/m.  3) anxiety--- stable, continue Lexapro 15 mg daily, Xanax as needed  Disposition/Need for in-Hospital Stay- patient unable to be discharged at this time due to -acute diverticulitis with microperforation requiring IV antibiotics  and IV fluids until oral intake is reliable  Status is: Inpatient  Remains inpatient appropriate because:acute diverticulitis with microperforation requiring IV antibiotics and IV fluids until oral intake is reliable   Disposition: The patient is from: Home              Anticipated d/c is to: Home              Anticipated d/c date is: 2 days              Patient currently is not medically stable to d/c. Barriers: Not Clinically Stable- acute diverticulitis with microperforation requiring IV antibiotics and IV fluids until oral intake is reliable  Code Status : Full code  Family Communication:   (patient is alert, awake and coherent)   Consults  : General surgery  DVT Prophylaxis  :  Lovenox -  SCDs   Lab Results  Component Value Date   PLT 227 05/28/2020    Inpatient Medications  Scheduled Meds: . enoxaparin (LOVENOX) injection  60 mg Subcutaneous QHS  . escitalopram  15 mg Oral Daily  . sodium chloride flush  3 mL Intravenous Q12H  . traZODone  100 mg Oral QHS   Continuous Infusions: . sodium chloride    . lactated ringers 125 mL/hr at 05/28/20 1014  . piperacillin-tazobactam (ZOSYN)  IV 12.5 mL/hr at 05/28/20 0527   PRN Meds:.sodium chloride, acetaminophen **OR** acetaminophen, albuterol, ALPRAZolam, bisacodyl, HYDROmorphone (DILAUDID) injection, ondansetron **OR** ondansetron (  ZOFRAN) IV, oxyCODONE, polyethylene glycol, promethazine, sodium chloride flush    Anti-infectives (From admission, onward)   Start     Dose/Rate Route Frequency Ordered Stop   05/27/20 0300  piperacillin-tazobactam (ZOSYN) IVPB 3.375 g     Discontinue     3.375 g 12.5 mL/hr over 240 Minutes Intravenous Every 8 hours 05/26/20 2252     05/26/20 1830  piperacillin-tazobactam (ZOSYN) IVPB 3.375 g        3.375 g 100 mL/hr over 30 Minutes Intravenous  Once 05/26/20 1825 05/26/20 2022        Objective:   Vitals:   05/27/20 1504 05/27/20 2006 05/27/20 2140 05/28/20 0519  BP: 113/75  124/81  122/69  Pulse: 93  96 93  Resp: 20  20 14   Temp: 98.4 F (36.9 C)  99.4 F (37.4 C) 98.2 F (36.8 C)  TempSrc: Oral  Oral Oral  SpO2: 98% 95% 94% 96%  Weight:      Height:        Wt Readings from Last 3 Encounters:  05/26/20 121.6 kg  01/12/19 121.6 kg  10/28/18 124.5 kg     Intake/Output Summary (Last 24 hours) at 05/28/2020 1237 Last data filed at 05/28/2020 0527 Gross per 24 hour  Intake 2213.19 ml  Output 3350 ml  Net -1136.81 ml     Physical Exam  Gen:- Awake Alert, morbidly obese, in no acute distress HEENT:- [place].AT, No sclera icterus Neck-Supple Neck,No JVD,.  Lungs-  CTAB , fair symmetrical air movement CV- S1, S2 normal, regular Abd-  +ve B.Sounds, Abd Soft, left lower quadrant and flank area tenderness, no significant guarding, no rebound  extremity/Skin:- No  edema, pedal pulses present  Psych-affect is appropriate, oriented x3 Neuro-no new focal deficits, no tremors   Data Review:   Micro Results Recent Results (from the past 240 hour(s))  SARS Coronavirus 2 by RT PCR (hospital order, performed in [facility] hospital lab) Nasopharyngeal Nasopharyngeal Swab     Status: None   Collection Time: 05/26/20  7:02 PM   Specimen: Nasopharyngeal Swab  Result Value Ref Range Status   SARS Coronavirus 2 NEGATIVE NEGATIVE Final    Comment: (NOTE) SARS-CoV-2 target nucleic acids are NOT DETECTED.  The SARS-CoV-2 RNA is generally detectable in upper and lower respiratory specimens during the acute phase of infection. The lowest concentration of SARS-CoV-2 viral copies this assay can detect is 250 copies / mL. A negative result does not preclude SARS-CoV-2 infection and should not be used as the sole basis for treatment or other patient management decisions.  A negative result may occur with improper specimen collection / handling, submission of specimen other than nasopharyngeal swab, presence of viral mutation(s) within the areas targeted by this assay, and  inadequate number of viral copies (<250 copies / mL). A negative result must be combined with clinical observations, patient history, and epidemiological information.  Fact Sheet for Patients:   StrictlyIdeas.no  Fact Sheet for Healthcare Providers: BankingDealers.co.za  This test is not yet approved or  cleared by the [place] FDA and has been authorized for detection and/or diagnosis of SARS-CoV-2 by FDA under an Emergency Use Authorization (EUA).  This EUA will remain in effect (meaning this test can be used) for the duration of the COVID-19 declaration under Section 564(b)(1) of the Act, 21 U.S.C. section 360bbb-3(b)(1), unless the authorization is terminated or revoked sooner.  Performed at [facility], [address], [place], [place] [address]     Radiology Reports DG  Chest 2 View  Result Date: 05/26/2020 CLINICAL DATA:  LEFT shoulder pain EXAM: CHEST - 2 VIEW COMPARISON:  12/09/2018 FINDINGS: Trachea is midline. Cardiomediastinal contours and hilar structures are normal. Lungs are clear. No sign of pleural effusion. On limited assessment skeletal structures without acute process. IMPRESSION: No acute cardiopulmonary disease. Electronically Signed   By: Zetta Bills M.D.   On: 05/26/2020 17:45   CT ABDOMEN PELVIS W CONTRAST  Result Date: 05/26/2020 CLINICAL DATA:  Suspect diverticulitis. Nausea, vomiting, LEFT LOWER QUADRANT abdominal pain since last night. History of diverticulitis. EXAM: CT ABDOMEN AND PELVIS WITH CONTRAST TECHNIQUE: Multidetector CT imaging of the abdomen and pelvis was performed using the standard protocol following bolus administration of intravenous contrast. CONTRAST:  124mL OMNIPAQUE IOHEXOL 300 MG/ML  SOLN COMPARISON:  09/13/2016 FINDINGS: Lower chest: No acute abnormality. Hepatobiliary: Gallbladder is distended. No pericholecystic fluid. No intrahepatic biliary duct dilatation. Liver is unremarkable.  Pancreas: Unremarkable. No pancreatic ductal dilatation or surrounding inflammatory changes. Spleen: Normal in size without focal abnormality. Adrenals/Urinary Tract: Adrenal glands are unremarkable. Kidneys are normal, without renal calculi, focal lesion, or hydronephrosis. Bladder is unremarkable. Stomach/Bowel: Stomach is normal in appearance. Small bowel loops are normal in caliber and wall thickness. There are numerous diverticula throughout the colon, especially involving the descending and sigmoid segments. There is moderate inflammatory change adjacent to the LOWER descending colon, in a segment of numerous diverticula. Suspect micro perforation just MEDIAL to the LOWER descending colon. No abscess or evidence for free intraperitoneal air. Appendectomy. Vascular/Lymphatic: No significant vascular findings are present. No enlarged abdominal or pelvic lymph nodes. Reproductive: Uterus is present. Intrauterine device is well positioned. Posterior calcified fibroid is 2.5 centimeters. The adnexal regions are unremarkable. Other: Small fat containing paraumbilical hernias. Musculoskeletal: Mild degenerative changes in the LOWER thoracic and lumbar spine. IMPRESSION: 1. Findings are consistent with acute diverticulitis involving the LOWER descending colon. Suspect micro perforation just MEDIAL to the LOWER descending colon. No abscess or free intraperitoneal air. 2. Distended gallbladder without evidence for acute cholecystitis. 3. Small fat containing paraumbilical hernias. 4. Intrauterine device. 5. Small calcified uterine fibroid. Electronically Signed   By: Nolon Nations M.D.   On: 05/26/2020 18:13     CBC Recent Labs  Lab 05/26/20 1634 05/27/20 0543 05/28/20 0508  WBC 15.5* 13.7* 10.1  HGB 14.8 13.4 13.2  HCT 44.9 41.0 40.4  PLT 234 232 227  MCV 90.2 89.9 91.4  MCH 29.7 29.4 29.9  MCHC 33.0 32.7 32.7  RDW 12.7 12.9 12.8  LYMPHSABS  --   --  2.0  MONOABS  --   --  1.0  EOSABS  --   --   0.2  BASOSABS  --   --  0.1    Chemistries  Recent Labs  Lab 05/26/20 1634 05/27/20 0543  NA 137 137  K 3.4* 3.6  CL 105 103  CO2 25 25  GLUCOSE 124* 138*  BUN 10 7  CREATININE 0.92 0.85  CALCIUM 8.9 8.7*  AST 13*  --   ALT 19  --   ALKPHOS 69  --   BILITOT 0.7  --    ------------------------------------------------------------------------------------------------------------------ No results for input(s): CHOL, HDL, LDLCALC, TRIG, CHOLHDL, LDLDIRECT in the last 72 hours.  No results found for: HGBA1C ------------------------------------------------------------------------------------------------------------------ No results for input(s): TSH, T4TOTAL, T3FREE, THYROIDAB in the last 72 hours.  Invalid input(s): FREET3 ------------------------------------------------------------------------------------------------------------------ No results for input(s): VITAMINB12, FOLATE, FERRITIN, TIBC, IRON, RETICCTPCT in the last 72 hours.  Coagulation profile No results  for input(s): INR, PROTIME in the last 168 hours.  No results for input(s): DDIMER in the last 72 hours.  Cardiac Enzymes No results for input(s): CKMB, TROPONINI, MYOGLOBIN in the last 168 hours.  Invalid input(s): CK ------------------------------------------------------------------------------------------------------------------ No results found for: BNP   Roxan Hockey M.D on 05/28/2020 at 12:37 PM  Go to [email or web address] - for contact info  [facility] - Office  [phone]

## 2020-05-28 NOTE — Progress Notes (Signed)
[facility] Progress Note     Subjective: Walked 12 laps, feeling better. Still with pain.   Objective: Vital signs in last 24 hours: Temp:  [98.2 F (36.8 C)-99.4 F (37.4 C)] 98.2 F (36.8 C) (07/06 0519) Pulse Rate:  [93-96] 93 (07/06 0519) Resp:  [14-20] 14 (07/06 0519) BP: (113-124)/(69-81) 122/69 (07/06 0519) SpO2:  [94 %-98 %] 96 % (07/06 0519) Last BM Date: 05/26/20  Intake/Output from previous day: 07/05 0701 - 07/06 0700 In: 2213.2 [P.O.:630; I.V.:1433.2; IV Piggyback:150] Out: 3350 [Urine:3350] Intake/Output this shift: No intake/output data recorded.  General appearance: alert, cooperative and no distress Resp: normal work of breathing GI: soft, nondistended, tender left side/ flank improving  Lab Results:  Recent Labs    05/27/20 0543 05/28/20 0508  WBC 13.7* 10.1  HGB 13.4 13.2  HCT 41.0 40.4  PLT 232 227   BMET Recent Labs    05/26/20 1634 05/27/20 0543  NA 137 137  K 3.4* 3.6  CL 105 103  CO2 25 25  GLUCOSE 124* 138*  BUN 10 7  CREATININE 0.92 0.85  CALCIUM 8.9 8.7*   PT/INR No results for input(s): LABPROT, INR in the last 72 hours.  Studies/Results: DG Chest 2 View  Result Date: 05/26/2020 CLINICAL DATA:  LEFT shoulder pain EXAM: CHEST - 2 VIEW COMPARISON:  12/09/2018 FINDINGS: Trachea is midline. Cardiomediastinal contours and hilar structures are normal. Lungs are clear. No sign of pleural effusion. On limited assessment skeletal structures without acute process. IMPRESSION: No acute cardiopulmonary disease. Electronically Signed   By: Zetta Bills M.D.   On: 05/26/2020 17:45   CT ABDOMEN PELVIS W CONTRAST  Result Date: 05/26/2020 CLINICAL DATA:  Suspect diverticulitis. Nausea, vomiting, LEFT LOWER QUADRANT abdominal pain since last night. History of diverticulitis. EXAM: CT ABDOMEN AND PELVIS WITH CONTRAST TECHNIQUE: Multidetector CT imaging of the abdomen and pelvis was performed using the standard protocol  following bolus administration of intravenous contrast. CONTRAST:  17mL OMNIPAQUE IOHEXOL 300 MG/ML  SOLN COMPARISON:  09/13/2016 FINDINGS: Lower chest: No acute abnormality. Hepatobiliary: Gallbladder is distended. No pericholecystic fluid. No intrahepatic biliary duct dilatation. Liver is unremarkable. Pancreas: Unremarkable. No pancreatic ductal dilatation or surrounding inflammatory changes. Spleen: Normal in size without focal abnormality. Adrenals/Urinary Tract: Adrenal glands are unremarkable. Kidneys are normal, without renal calculi, focal lesion, or hydronephrosis. Bladder is unremarkable. Stomach/Bowel: Stomach is normal in appearance. Small bowel loops are normal in caliber and wall thickness. There are numerous diverticula throughout the colon, especially involving the descending and sigmoid segments. There is moderate inflammatory change adjacent to the LOWER descending colon, in a segment of numerous diverticula. Suspect micro perforation just MEDIAL to the LOWER descending colon. No abscess or evidence for free intraperitoneal air. Appendectomy. Vascular/Lymphatic: No significant vascular findings are present. No enlarged abdominal or pelvic lymph nodes. Reproductive: Uterus is present. Intrauterine device is well positioned. Posterior calcified fibroid is 2.5 centimeters. The adnexal regions are unremarkable. Other: Small fat containing paraumbilical hernias. Musculoskeletal: Mild degenerative changes in the LOWER thoracic and lumbar spine. IMPRESSION: 1. Findings are consistent with acute diverticulitis involving the LOWER descending colon. Suspect micro perforation just MEDIAL to the LOWER descending colon. No abscess or free intraperitoneal air. 2. Distended gallbladder without evidence for acute cholecystitis. 3. Small fat containing paraumbilical hernias. 4. Intrauterine device. 5. Small calcified uterine fibroid. Electronically Signed   By: Nolon Nations M.D.   On: 05/26/2020 18:13     Anti-infectives: Anti-infectives (From admission, onward)   Start  Dose/Rate Route Frequency Ordered Stop   05/27/20 0300  piperacillin-tazobactam (ZOSYN) IVPB 3.375 g     Discontinue     3.375 g 12.5 mL/hr over 240 Minutes Intravenous Every 8 hours 05/26/20 2252     05/26/20 1830  piperacillin-tazobactam (ZOSYN) IVPB 3.375 g        3.375 g 100 mL/hr over 30 Minutes Intravenous  Once 05/26/20 1825 05/26/20 2022      Assessment/Plan: Cynthia Ochoa is a 44 yo with recurrent perforated diverticulitis. Improving but still with pain. Wants mashed potatoes. PRN for pain, trying to use orals IV zosyn until discharge 3-4 days of IV would be best Transition to oral augmentin at discharge for total 14 days FMLA to be filled out by clinic and cover from hospitalization through the end of day 7/16. She is going on Vacation from 7/17 to 8/3  Discussed with Dr. Denton Brick. Plan for dc Thursday.   LOS: 2 days    Virl Cagey 05/28/2020

## 2020-05-28 NOTE — Plan of Care (Signed)

## 2020-05-29 DIAGNOSIS — E66813 Obesity, class 3: Secondary | ICD-10-CM | POA: Diagnosis present

## 2020-05-29 DIAGNOSIS — F411 Generalized anxiety disorder: Secondary | ICD-10-CM

## 2020-05-29 MED ORDER — TRAZODONE HCL 100 MG PO TABS: 100.0000 mg | ORAL_TABLET | Freq: Every evening | ORAL | 3 refills | Status: DC | PRN

## 2020-05-29 MED ORDER — PROBIOTIC ACIDOPHILUS PO TABS: 1.0000 | ORAL_TABLET | Freq: Two times a day (BID) | ORAL | 0 refills | Status: DC

## 2020-05-29 MED ORDER — PIPERACILLIN-TAZOBACTAM 3.375 G IVPB 30 MIN
3.3750 g | Freq: Once | INTRAVENOUS | Status: AC
  Administered 2020-05-29: 3.375 g via INTRAVENOUS
  Filled 2020-05-29 (×2): qty 50

## 2020-05-29 MED ORDER — FLUCONAZOLE 150 MG PO TABS: 150.0000 mg | ORAL_TABLET | Freq: Once | ORAL | 0 refills | Status: AC

## 2020-05-29 MED ORDER — PROMETHAZINE HCL 25 MG PO TABS: 25.0000 mg | ORAL_TABLET | Freq: Four times a day (QID) | ORAL | 0 refills | Status: DC | PRN

## 2020-05-29 MED ORDER — ESCITALOPRAM OXALATE 10 MG PO TABS: 15.0000 mg | ORAL_TABLET | Freq: Every day | ORAL | 2 refills | Status: DC

## 2020-05-29 MED ORDER — OXYCODONE HCL 5 MG PO TABS: 5.0000 mg | ORAL_TABLET | Freq: Four times a day (QID) | ORAL | 0 refills | Status: AC | PRN

## 2020-05-29 MED ORDER — AMOXICILLIN-POT CLAVULANATE 875-125 MG PO TABS: 1.0000 | ORAL_TABLET | Freq: Two times a day (BID) | ORAL | 0 refills | Status: AC

## 2020-05-29 NOTE — Discharge Summary (Signed)
Cynthia Ochoa, is a 44 y.o. female  DOB 13-Jun-1976  MRN [id number].  Admission date:  05/26/2020  Admitting Physician  Lynetta Mare, MD  Discharge Date:  05/29/2020   Primary MD  Celene Squibb, MD  Recommendations for primary care physician for things to follow:   1)Please take medications as prescribed including Augmentin antibiotic--- 2)Please follow-up with Dr. Constance Haw or your primary care physician if persistent diarrhea (more than 3 loose/watery stools for more than 2 consecutive days) --- Drink plenty fluids and avoid dehydration  Admission Diagnosis  Diverticulitis [K57.92] Acute diverticulitis [K57.92] Left lower quadrant abdominal pain [R10.32]   Discharge Diagnosis  Diverticulitis [K57.92] Acute diverticulitis [K57.92] Left lower quadrant abdominal pain [R10.32]    Principal Problem:   Diverticulitis of intestine with perforation Active Problems:   Acute diverticulitis of intestine--recurrent diverticulitis 3 Episodes since 2016   Obesity, Class III, BMI 40-49.9 (morbid obesity) ([place])   ANXIETY DISORDER, GENERALIZED      Past Medical History:  Diagnosis Date  . Abnormal Pap smear   . Diverticulitis   . Fibroid   . Hx of campylobacteriosis 2005  . Hx of Salmonella gastroenteritis   . IBS (irritable bowel syndrome)     Past Surgical History:  Procedure Laterality Date  . CERVICAL CONIZATION W/BX N/A 09/07/2013   Procedure: CONIZATION CERVIX WITH BIOPSY/REMOVAL OF IUD AND INSERTION;  Surgeon: Cyril Mourning, MD;  Location: [facility];  Service: Gynecology;  Laterality: N/A;  . COLONOSCOPY  2005 NUR   I/E HEMORRHOIDS, TIC AT HF, NL TI  . COLONOSCOPY N/A 12/27/2015   Procedure: COLONOSCOPY;  Surgeon: Danie Binder, MD;  [facility];  Service: Endoscopy;  Laterality: N/A;  0830  . COLPOSCOPY    . CYSTECTOMY     abdomen  . GYNECOLOGIC CRYOSURGERY    . LAPAROSCOPIC  APPENDECTOMY N/A 09/14/2016   Procedure: APPENDECTOMY LAPAROSCOPIC;  Surgeon: Aviva Signs, MD;  Location: [facility];  Service: General;  Laterality: N/A;  . VAGINAL DELIVERY  04/07/13  . WISDOM TOOTH EXTRACTION       HPI  from the history and physical done on the day of admission:     HPI: Cynthia ABRUZZESE is a 44 y.o. female with medical history significant of anxiety who presented to the ER with left-sided abdominal pain.  Patient reported having left lower quadrant abdominal pain starting 4 PM last night.  Patient states she had continuous and persistent pain all night and then today she started having nausea with multiple episodes of vomiting.  No hematemesis, melena, hematochezia reported.  She has a history of 2 prior episodes of diverticulitis in 2017 and 2018. No fever, chills, cough, shortness of breath, dizziness, lightheadedness, seizures, syncope. ED Course:  Vital Signs reviewed on presentation, significant for temperature 98.5, heart rate 95, blood pressure 116/70, saturation 95% on room air. Labs reviewed, significant for sodium 137, potassium 3.4, BUN 10, creatinine 0.9, LFTs within normal limits, WBC count 15.5, hemoglobin 14.8, hematocrit 44, platelets 234.  Pregnancy  test is negative.  SARS Covid RT-PCR is negative.  Urinalysis is negative. Imaging personally Reviewed, CT of the abdomen shows findings consistent with acute diverticulitis involving the lower descending colon.  Suspect microperforation just medial to the lower descending colon.  No abscess or free intraperitoneal air.  Distended gallbladder with no evidence of cholecystitis.  Intrauterine device is noted.  Chest x-ray shows no acute cardiopulmonary disease. EKG personally reviewed, shows sinus tachycardia, no acute ST-T changes.    Hospital Course:    Brief Summary:- 44 y.o.femalewith medical history significant ofanxiety , morbid obesity and recurrent diverticulitis admitted on 05/26/2020 and found to have  acute diverticulitis with microperforation  A/p 1) acute diverticulitis with microperforation--- patient with recurrent episodes of diverticulitis, first episode in 2016, second episode in 2018 was associated with contained perforation -Treated with  IV Zosyn, as needed antiemetics as needed opiates -Overall much improved  -Surgical consult appreciated -WBC is down to 10.1 from 15.5 -Tolerating soft diet well -Per surgical team okay to discharge home on oral antibiotics  2)Morbid Obesity- -Low calorie diet, portion control and increase physical activity discussed with patient --Prior to admission patient actually has been trying a diet high in nuts -Body mass index is >> 40  3) anxiety--- stable, continue Lexapro 15 mg daily, Xanax as needed.  May use trazodone as needed for sleep  Disposition/--Home   Code Status : Full code  Family Communication:   (patient is alert, awake and coherent)  -Patient's mother at bedside  Consults  : General surgery  Discharge Condition: --Stable,  Follow UP   Follow-up Information    Virl Cagey, MD. Schedule an appointment as soon as possible for a visit on 06/27/2020.   Specialty: General Surgery Contact information: [address] [place] [address] [phone]               Diet and Activity recommendation:  As advised  Discharge Instructions    Discharge Instructions    Call MD for:  difficulty breathing, headache or visual disturbances   Complete by: As directed    Call MD for:  persistant dizziness or light-headedness   Complete by: As directed    Call MD for:  persistant nausea and vomiting   Complete by: As directed    Call MD for:  severe uncontrolled pain   Complete by: As directed    Call MD for:  temperature >100.4   Complete by: As directed    Diet - low sodium heart healthy   Complete by: As directed    Discharge instructions   Complete by: As directed    1)Please take medications as  prescribed including Augmentin antibiotic--- 2)Please follow-up with Dr. Constance Haw or your primary care physician if persistent diarrhea (more than 3 loose/watery stools for more than 2 consecutive days) --- Drink plenty fluids and avoid dehydration   Increase activity slowly   Complete by: As directed         Discharge Medications     Allergies as of 05/29/2020      Reactions   Ciprofloxacin Other (See Comments)   Nerve pain. - IV only   Morphine And Related Other (See Comments)   CHEST PRESSURE   Adhesive [tape] Rash      Medication List    STOP taking these medications   buPROPion 100 MG 12 hr tablet Commonly known as: WELLBUTRIN SR     TAKE these medications   Albuterol Sulfate 108 (90 Base) MCG/ACT Aepb Commonly known as:  ProAir RespiClick Inhale 1-2 puffs into the lungs every 6 (six) hours as needed.   ALPRAZolam 0.5 MG tablet Commonly known as: XANAX Take 0.5 mg by mouth 2 (two) times daily as needed for anxiety.   amoxicillin-clavulanate 875-125 MG tablet Commonly known as: Augmentin Take 1 tablet by mouth 2 (two) times daily for 10 days.   Diethylpropion HCl CR 75 MG Tb24 Take 1 tablet by mouth daily.   escitalopram 10 MG tablet Commonly known as: LEXAPRO Take 1.5 tablets (15 mg total) by mouth daily. Start taking on: May 30, 2020 What changed: how much to take   fluconazole 150 MG tablet Commonly known as: Diflucan Take 1 tablet (150 mg total) by mouth once for 1 dose. For yeast infection Start taking on: June 01, 2020   oxyCODONE 5 MG immediate release tablet Commonly known as: Oxy IR/ROXICODONE Take 1 tablet (5 mg total) by mouth every 6 (six) hours as needed for up to 5 days for moderate pain or severe pain.   Probiotic Acidophilus Tabs Take 1 tablet by mouth 2 (two) times daily.   promethazine 25 MG tablet Commonly known as: PHENERGAN Take 1 tablet (25 mg total) by mouth every 6 (six) hours as needed for nausea or vomiting.   traZODone 100  MG tablet Commonly known as: DESYREL Take 1 tablet (100 mg total) by mouth at bedtime as needed for sleep.       Major procedures and Radiology Reports - PLEASE review detailed and final reports for all details, in brief -   DG Chest 2 View  Result Date: 05/26/2020 CLINICAL DATA:  LEFT shoulder pain EXAM: CHEST - 2 VIEW COMPARISON:  12/09/2018 FINDINGS: Trachea is midline. Cardiomediastinal contours and hilar structures are normal. Lungs are clear. No sign of pleural effusion. On limited assessment skeletal structures without acute process. IMPRESSION: No acute cardiopulmonary disease. Electronically Signed   By: Zetta Bills M.D.   On: 05/26/2020 17:45   CT ABDOMEN PELVIS W CONTRAST  Result Date: 05/26/2020 CLINICAL DATA:  Suspect diverticulitis. Nausea, vomiting, LEFT LOWER QUADRANT abdominal pain since last night. History of diverticulitis. EXAM: CT ABDOMEN AND PELVIS WITH CONTRAST TECHNIQUE: Multidetector CT imaging of the abdomen and pelvis was performed using the standard protocol following bolus administration of intravenous contrast. CONTRAST:  175mL OMNIPAQUE IOHEXOL 300 MG/ML  SOLN COMPARISON:  09/13/2016 FINDINGS: Lower chest: No acute abnormality. Hepatobiliary: Gallbladder is distended. No pericholecystic fluid. No intrahepatic biliary duct dilatation. Liver is unremarkable. Pancreas: Unremarkable. No pancreatic ductal dilatation or surrounding inflammatory changes. Spleen: Normal in size without focal abnormality. Adrenals/Urinary Tract: Adrenal glands are unremarkable. Kidneys are normal, without renal calculi, focal lesion, or hydronephrosis. Bladder is unremarkable. Stomach/Bowel: Stomach is normal in appearance. Small bowel loops are normal in caliber and wall thickness. There are numerous diverticula throughout the colon, especially involving the descending and sigmoid segments. There is moderate inflammatory change adjacent to the LOWER descending colon, in a segment of numerous  diverticula. Suspect micro perforation just MEDIAL to the LOWER descending colon. No abscess or evidence for free intraperitoneal air. Appendectomy. Vascular/Lymphatic: No significant vascular findings are present. No enlarged abdominal or pelvic lymph nodes. Reproductive: Uterus is present. Intrauterine device is well positioned. Posterior calcified fibroid is 2.5 centimeters. The adnexal regions are unremarkable. Other: Small fat containing paraumbilical hernias. Musculoskeletal: Mild degenerative changes in the LOWER thoracic and lumbar spine. IMPRESSION: 1. Findings are consistent with acute diverticulitis involving the LOWER descending colon. Suspect micro perforation just MEDIAL to the LOWER descending  colon. No abscess or free intraperitoneal air. 2. Distended gallbladder without evidence for acute cholecystitis. 3. Small fat containing paraumbilical hernias. 4. Intrauterine device. 5. Small calcified uterine fibroid. Electronically Signed   By: Nolon Nations M.D.   On: 05/26/2020 18:13    Micro Results    Recent Results (from the past 240 hour(s))  SARS Coronavirus 2 by RT PCR (hospital order, performed in [facility] hospital lab) Nasopharyngeal Nasopharyngeal Swab     Status: None   Collection Time: 05/26/20  7:02 PM   Specimen: Nasopharyngeal Swab  Result Value Ref Range Status   SARS Coronavirus 2 NEGATIVE NEGATIVE Final    Comment: (NOTE) SARS-CoV-2 target nucleic acids are NOT DETECTED.  The SARS-CoV-2 RNA is generally detectable in upper and lower respiratory specimens during the acute phase of infection. The lowest concentration of SARS-CoV-2 viral copies this assay can detect is 250 copies / mL. A negative result does not preclude SARS-CoV-2 infection and should not be used as the sole basis for treatment or other patient management decisions.  A negative result may occur with improper specimen collection / handling, submission of specimen other than nasopharyngeal swab,  presence of viral mutation(s) within the areas targeted by this assay, and inadequate number of viral copies (<250 copies / mL). A negative result must be combined with clinical observations, patient history, and epidemiological information.  Fact Sheet for Patients:   StrictlyIdeas.no  Fact Sheet for Healthcare Providers: BankingDealers.co.za  This test is not yet approved or  cleared by the [place] FDA and has been authorized for detection and/or diagnosis of SARS-CoV-2 by FDA under an Emergency Use Authorization (EUA).  This EUA will remain in effect (meaning this test can be used) for the duration of the COVID-19 declaration under Section 564(b)(1) of the Act, 21 U.S.C. section 360bbb-3(b)(1), unless the authorization is terminated or revoked sooner.  Performed at [facility], [address], [place], [place] [address]        Today   Subjective    Steele Ledonne today has no new complaints, -Patient's mother at bedside, -tolerating oral intake well     -No further diarrhea      Patient has been seen and examined prior to discharge   Objective   Blood pressure 127/82, pulse 75, temperature 98 F (36.7 C), temperature source Oral, resp. rate 16, height 5\' 7"  (1.702 m), weight 128 kg, SpO2 97 %.   Intake/Output Summary (Last 24 hours) at 05/29/2020 1620 Last data filed at 05/29/2020 1211 Gross per 24 hour  Intake 480 ml  Output 1900 ml  Net -1420 ml    Exam Gen:- Awake Alert, morbidly obese, in no acute distress HEENT:- [place].AT, No sclera icterus Neck-Supple Neck,No JVD,.  Lungs-  CTAB , fair symmetrical air movement CV- S1, S2 normal, regular Abd-  +ve B.Sounds, Abd Soft, left lower quadrant and flank area tenderness, no significant guarding, no rebound  extremity/Skin:- No  edema, pedal pulses present  Psych-affect is appropriate, oriented x3 Neuro-no new focal deficits, no tremors   Data Review   CBC w  Diff:  Lab Results  Component Value Date   WBC 10.1 05/28/2020   HGB 13.2 05/28/2020   HCT 40.4 05/28/2020   PLT 227 05/28/2020   LYMPHOPCT 19 05/28/2020   MONOPCT 10 05/28/2020   EOSPCT 2 05/28/2020   BASOPCT 1 05/28/2020    CMP:  Lab Results  Component Value Date   NA 137 05/27/2020   K 3.6 05/27/2020  CL 103 05/27/2020   CO2 25 05/27/2020   BUN 7 05/27/2020   CREATININE 0.85 05/27/2020   PROT 7.2 05/26/2020   ALBUMIN 4.1 05/26/2020   BILITOT 0.7 05/26/2020   ALKPHOS 69 05/26/2020   AST 13 (L) 05/26/2020   ALT 19 05/26/2020  .   Total Discharge time is about 33 minutes  Roxan Hockey M.D on 05/29/2020 at 4:20 PM  Go to [email or web address] -  for contact info  [facility] - Office  [phone]

## 2020-05-29 NOTE — Discharge Instructions (Signed)
1)Please take medications as prescribed including Augmentin antibiotic--- 2)Please follow-up with Dr. Constance Haw or your primary care physician if persistent diarrhea (more than 3 loose/watery stools for more than 2 consecutive days) --- Drink plenty fluids and avoid dehydration

## 2020-05-29 NOTE — Progress Notes (Signed)
[facility]  Doing better. Less pain. Tolerating diet.   BP 127/82 (BP Location: Right Arm)   Pulse 75   Temp 98 F (36.7 C) (Oral)   Resp 16   Ht 5\' 7"  (1.702 m)   Wt 128 kg   SpO2 97%   BMI 44.20 kg/m  NAD Normal work of breathing Soft, non-distended, significantly less left flank tenderness    Cynthia Ochoa is a 44 yo with recurrent perforated diverticulitis. Improving. Transition to oral Augmentin Diet as tolerated Diflucan prophylaxis as  FMLA/ disability from admission 7/4 to 7/16 end of day. Will be on vacation from 7/17-8/3. Can follow up with me on 8/5.  Curlene Labrum, MD [facility] [address] [place], [place] [address] [phone] (office)

## 2020-05-31 ENCOUNTER — Telehealth: Payer: Self-pay | Admitting: Family Medicine

## 2020-05-31 ENCOUNTER — Encounter: Payer: Self-pay | Admitting: *Deleted

## 2020-05-31 ENCOUNTER — Other Ambulatory Visit: Payer: Self-pay | Admitting: *Deleted

## 2020-05-31 NOTE — Patient Outreach (Signed)
[facility] Care Management  05/31/2020  Cynthia Ochoa May 12, 1976 [id number]   Transition of care call/case closure   Referral received:05/28/20 Initial outreach:05/31/20 Insurance: [place] [employer]    Subjective: Initial successful telephone call to patient's preferred number in order to complete transition of care assessment; 2 HIPAA identifiers verified. Explained purpose of call and completed transition of care assessment.  Cynthia Ochoa states that she is dong alright just weak. She denies abdominal discomfort has not required prn pain medication. She report not having diarrhea episodes no nausea. She report her appetite is about 50% she continues to work on it but is drinking well.   Spouse/parents  are assisting with her  recovery.  She denies any ongoing health issues and says she does not need a referral to one of the [place] chronic disease management programs.  She does not have the hospital indemnity She  uses a [facility] outpatient pharmacy.  .    Objective:  Cynthia Ochoa  was hospitalized at [facility]  from 7/4-05/29/20  For Diverticulitis of intestine with micro perforation  Comorbidities include: Obesity, Anxiety,  She was discharged to home on 7/7/21without the need for home health services or DME.   Assessment:  Patient voices good understanding of all discharge instructions.  See transition of care flowsheet for assessment details.   Plan:  Reviewed hospital discharge diagnosis of Diverticulitis    and discharge treatment plan using hospital discharge instructions, assessing medication adherence, reviewing problems requiring provider notification, and discussing the importance of follow up with surgeon, primary care provider and/or specialists as directed.  Reviewed [place] healthy lifestyle program information to receive discounted premium for  2022   Step 1: Get  your annual physical  Step 2: Complete your health assessment  Step  3:Identify your current health status and complete the corresponding action step between January 1, and July 24, 2020.  States she has completed    No ongoing care management needs identified so will close case to [place] Management services and route successful outreach letter with [place] Management pamphlet and 24 Hour Nurse Line Magnet to [place] Management clinical pool to be mailed to patient's home address.  Thanked patient for their services to [facility].   Cynthia Draft, RN, BSN  [place] Management Coordinator  [phone]- Mobile [phone]- Toll Free Main Office

## 2020-05-31 NOTE — Telephone Encounter (Signed)
FMLA faxed to [employer] on 05/29/2020 with confirmation.  Fax #: [phone]  Faxed Disability on 05/31/2020 to [employer] [place] [employer] with confirmation. Fax#: [phone]

## 2020-06-24 DIAGNOSIS — Z01419 Encounter for gynecological examination (general) (routine) without abnormal findings: Secondary | ICD-10-CM | POA: Diagnosis not present

## 2020-06-24 DIAGNOSIS — L709 Acne, unspecified: Secondary | ICD-10-CM | POA: Diagnosis not present

## 2020-06-24 DIAGNOSIS — Z6841 Body Mass Index (BMI) 40.0 and over, adult: Secondary | ICD-10-CM | POA: Diagnosis not present

## 2020-06-24 MED FILL — ESCITALOPRAM 10 MG TABLET: 10 | 30 days supply | Qty: 45 | Fill #2

## 2020-06-24 MED FILL — ALPRAZolam 0.5 MG TABS: 0.5 | 30 days supply | Qty: 60 | Fill #2

## 2020-06-24 MED FILL — DIETHYLPROPION ER 75 MG TAB: 75 | 30 days supply | Qty: 30 | Fill #0

## 2020-06-24 MED FILL — CLINDAMYCIN PHOSP 1% LOTION: 1 | 14 days supply | Qty: 60 | Fill #0

## 2020-06-24 MED FILL — DOXYCYCLINE HYCLATE 100 MG: 100 | 30 days supply | Qty: 30 | Fill #0

## 2020-06-26 ENCOUNTER — Ambulatory Visit (INDEPENDENT_AMBULATORY_CARE_PROVIDER_SITE_OTHER): Payer: 59 | Admitting: Internal Medicine

## 2020-06-27 ENCOUNTER — Ambulatory Visit: Payer: Self-pay | Admitting: General Surgery

## 2020-07-04 ENCOUNTER — Ambulatory Visit (INDEPENDENT_AMBULATORY_CARE_PROVIDER_SITE_OTHER): Payer: 59 | Admitting: Internal Medicine

## 2020-07-04 ENCOUNTER — Encounter (INDEPENDENT_AMBULATORY_CARE_PROVIDER_SITE_OTHER): Payer: Self-pay | Admitting: Internal Medicine

## 2020-07-04 ENCOUNTER — Other Ambulatory Visit (INDEPENDENT_AMBULATORY_CARE_PROVIDER_SITE_OTHER): Payer: Self-pay | Admitting: Internal Medicine

## 2020-07-04 ENCOUNTER — Other Ambulatory Visit: Payer: Self-pay

## 2020-07-04 DIAGNOSIS — K580 Irritable bowel syndrome with diarrhea: Secondary | ICD-10-CM

## 2020-07-04 DIAGNOSIS — K589 Irritable bowel syndrome without diarrhea: Secondary | ICD-10-CM | POA: Insufficient documentation

## 2020-07-04 DIAGNOSIS — Z8719 Personal history of other diseases of the digestive system: Secondary | ICD-10-CM | POA: Diagnosis not present

## 2020-07-04 MED ORDER — CIPROFLOXACIN HCL 500 MG PO TABS
500.0000 mg | ORAL_TABLET | Freq: Two times a day (BID) | ORAL | 0 refills | Status: DC
Start: 2020-07-04 — End: 2021-04-15

## 2020-07-04 MED ORDER — DICYCLOMINE HCL 10 MG PO CAPS: 10.0000 mg | ORAL_CAPSULE | Freq: Two times a day (BID) | ORAL | 5 refills | Status: DC

## 2020-07-04 MED ORDER — METRONIDAZOLE 500 MG PO TABS
500.0000 mg | ORAL_TABLET | Freq: Two times a day (BID) | ORAL | 0 refills | Status: DC
Start: 2020-07-04 — End: 2021-04-15

## 2020-07-04 MED ORDER — MESALAMINE ER 0.375 G PO CP24: 1500.0000 mg | ORAL_CAPSULE | Freq: Every day | ORAL | 11 refills | Status: DC

## 2020-07-04 MED FILL — MESALAMINE ER 0.375 GM CP24: 0.375 | 30 days supply | Qty: 120 | Fill #0

## 2020-07-04 MED FILL — CIPROFLOXACIN HCL 500 MG TA: 500 | 10 days supply | Qty: 20 | Fill #0

## 2020-07-04 MED FILL — metroNIDAZOLE 500 MG TABS: 500 | 10 days supply | Qty: 20 | Fill #0

## 2020-07-04 MED FILL — DICYCLOMINE 10 MG CAPSULE: 10 | 30 days supply | Qty: 60 | Fill #0

## 2020-07-04 NOTE — Progress Notes (Signed)
Reason for consultation  Recurrent diverticulitis.  History of present illness  Patient is 44 year old Caucasian female who is referred to courtesy of Dr. Delphina Cahill for evaluation regarding recurrent diverticulitis. Patient's first episode occurred in November 2016 when she was seen in emergency room.  Diagnosis was confirmed with CT.  She responded to antibiotic therapy. Second episode occurred on November 12, 2015 after she had taken first dose of Suprep.  CT revealed diverticulitis involving the mid transverse colon.  She was hospitalized and responded to antibiotic therapy. She went on to have colonoscopy on 12/27/2015.  This exam revealed diverticulitis at transverse, descending and sigmoid colon.  Most recent episode(3rd) occurred 05/26/2020 and this time she was found to have diverticulitis involving distal descending colon.  Microperforation no abscess or fluid.  She was hospitalized for 3 days and has completed course of antibiotic.  She was initially treated with Cipro and metronidazole and later with Augmentin. Since patient has had multiple episodes surgery was considered.  She was seen by Dr. Constance Haw during hospitalization.  She had follow-up visit with Dr. Constance Haw but she is not interested in having surgery at this time.  She is very concerned because diverticulitis involve 3 different areas and wonders how much of her colon will need to be removed.  She is abdominal pain-free today.  She recalls she had pain in the left upper quadrant of her abdomen with her first episode.  Episode that she had in December 2016 and 1 last month resulted in severe pain in left posterior lateral abdomen. She she has diarrhea often.  Every now and then she has 1 on some days she can have as many as 15 stools like she had last night after eating [place] food.  She has occasional feeling of a knot in her left abdomen.  She denies melena or rectal bleeding.  She does not take OTC NSAIDs. She says she has lost 18  pounds in the last 2 months since she has been on Diethylpropion. She has change her eating habits.  She is taking 10 mg of fiber supplement daily and she is tolerating it.  She is also trying to eat less red meat.  She wants to know if there is medical therapy that would prevent further episodes of diverticulitis. Patient states that she has taken Cipro by mouth without any side effects.  She has developed leg pain/nerve pain with IV Cipro.   Current Medications: Outpatient Encounter Medications as of 07/04/2020  Medication Sig  . ALPRAZolam (XANAX) 0.5 MG tablet Take 0.5 mg by mouth 2 (two) times daily as needed for anxiety.  . Diethylpropion HCl CR 75 MG TB24 Take 1 tablet by mouth daily.  Marland Kitchen escitalopram (LEXAPRO) 10 MG tablet Take 1.5 tablets (15 mg total) by mouth daily.  Marland Kitchen FIBER SELECT GUMMIES PO Take by mouth. 10 gram 4 gummies once a day  . Lactobacillus (PROBIOTIC ACIDOPHILUS) TABS Take 1 tablet by mouth 2 (two) times daily.  . Albuterol Sulfate (PROAIR RESPICLICK) 201 (90 Base) MCG/ACT AEPB Inhale 1-2 puffs into the lungs every 6 (six) hours as needed. (Patient not taking: Reported on 07/04/2020)  . doxycycline (VIBRAMYCIN) 100 MG capsule Take 100 mg by mouth daily. (Patient not taking: Reported on 07/04/2020)  . promethazine (PHENERGAN) 25 MG tablet Take 1 tablet (25 mg total) by mouth every 6 (six) hours as needed for nausea or vomiting. (Patient not taking: Reported on 07/04/2020)  . traZODone (DESYREL) 100 MG tablet Take 1 tablet (100 mg total)  by mouth at bedtime as needed for sleep. (Patient not taking: Reported on 07/04/2020)   No facility-administered encounter medications on file as of 07/04/2020.   Past medical history  Obesity. Stress disorder. History of abnormal Pap smear for which she underwent conization of cervix in May 2014. Remote history of Salmonella enterocolitis and Campylobacter enteritis occurring at different times. Irritable bowel syndrome. Acne. History of  diverticulitis details of which are reviewed above. Colonoscopy in February 2017 revealing diverticuli at hepatic flexure transverse colon as well as descending and sigmoid colon.   History of internal hemorrhoids for which she has undergone banding in the past. She had granulation tissue removed from lower abdominal wall few years ago. Appendectomy in October 2017.  Allergies  Allergies  Allergen Reactions  . Ciprofloxacin Other (See Comments)    Nerve pain. - IV only  . Morphine And Related Other (See Comments)    CHEST PRESSURE  . Adhesive [Tape] Rash     Family history  Mother is 89 years old and has Crohn's disease. Father is 41 years old.  He had surgery for congenital heart valve disease.  He had tricuspid and mitral valves replaced.  He is doing well. She has a sister age 70 in good health and a brother age 72 in poor health.  He has Prader-Willi syndrome.  Social history  Sydell is married.  She has a healthy 80-year-old son. 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  She has never smoked cigarettes and does not drink alcohol.  She does not do any regular scheduled physical activity.  Physical examination  Blood pressure 140/86, pulse 80, temperature 98.5 F (36.9 C), temperature source Oral, height _0  (1.702 m), weight 269 lb (122 kg). Patient is alert and in no acute distress. She is wearing a mask. Conjunctiva is pink. Sclera is nonicteric Oropharyngeal mucosa is normal. No neck masses or thyromegaly noted. Cardiac exam with regular rhythm normal S1 and S2. No murmur or gallop noted. Lungs are clear to auscultation. Abdomen is obese.  Bowel sounds are normal.  On palpation it is soft and nontender without organomegaly or masses. No LE edema or clubbing noted.  Labs/studies Results:  CBC Latest Ref Rng & Units 05/28/2020 05/27/2020 05/26/2020  WBC 4.0 - 10.5 K/uL 10.1 13.7(H) 15.5(H)  Hemoglobin 12.0 - 15.0 g/dL 13.2 13.4 14.8  Hematocrit 36 - 46 % 40.4  41.0 44.9  Platelets 150 - 400 K/uL 227 232 234    CMP Latest Ref Rng & Units 05/27/2020 05/26/2020 09/13/2016  Glucose 70 - 99 mg/dL 138(H) 124(H) 124(H)  BUN 6 - 20 mg/dL _1 Creatinine 0.44 - 1.00 mg/dL 0.85 0.92 0.75  Sodium 135 - 145 mmol/L 137 137 138  Potassium 3.5 - 5.1 mmol/L 3.6 3.4(L) 3.7  Chloride 98 - 111 mmol/L 103 105 107  CO2 22 - 32 mmol/L _2 Calcium 8.9 - 10.3 mg/dL 8.7(L) 8.9 8.8(L)  Total Protein 6.5 - 8.1 g/dL - 7.2 7.0  Total Bilirubin 0.3 - 1.2 mg/dL - 0.7 0.6  Alkaline Phos 38 - 126 U/L - 69 65  AST 15 - 41 U/L - 13(L) 19  ALT 0 - 44 U/L - 19 19    Hepatic Function Latest Ref Rng & Units 05/26/2020 09/13/2016 11/12/2015  Total Protein 6.5 - 8.1 g/dL 7.2 7.0 7.8  Albumin 3.5 - 5.0 g/dL 4.1 4.1 4.6  AST 15 - 41 U/L 13(L) 19 18  ALT 0 -  44 U/L _0 Alk Phosphatase 38 - 126 U/L 69 65 71  Total Bilirubin 0.3 - 1.2 mg/dL 0.7 0.6 0.9    Following imaging studies reviewed. CT from 10/12/2015. Changes of diverticulitis involving mid transverse colon.  CT from 11/12/2015 Changes of diverticulitis involving distal transverse colon.  Focus of extraluminal air.  CT from 05/26/2020 Changes of acute diverticulitis involving distal descending colon; possible tiny focus of gas outside the lumen medially. Small fat-containing periumbilical hernias.  Assessment:  #1.  Patient is 44 year old with three episodes of diverticulitis and she required hospitalization for two of those.  Two of these episodes involved transverse colon and most recent one which appears to be reversible distal descending colon.  She has responded to antibiotic each time.  Risk of complicated disease is significant.  Her only risk factor is obesity.  She is aware not to take OTC NSAIDs There is no definite therapy that would prevent future episodes of diverticulitis but I believe changing her gut flora by changing her diet would help and there may be some role for oral mesalamine which is  anti-inflammatory when it comes to colonic mucosa.  She is willing to try this medication knowing that this would be off label use.  If she is able to tolerate this medication she will have to take it for long time before we would know whether or not it is helping.  At least oral mesalamine is a low risk medication. If she experiences another episode despite high-fiber diet probiotics and oral mesalamine would consider surgery strongly.  It appears she will need to have part of her transverse colon and descending colon removed.  #2.  Irritable bowel syndrome.  She has frequent diarrhea and she would benefit from antispasmodic therapy as probiotic and fiber supplement is not helping.  There is no evidence that IBS has anything to do with diverticulitis.   Recommendations  Continue high-fiber diet. Fibers supplement but split the dose to 5 g twice daily. Dicyclomine 10 mg by mouth twice daily i.e. before breakfast and evening meal or lunch. Apriso 59 mg by mouth daily with breakfast. Patient is aware not to take NSAIDs unless it is low-dose aspirin. Patient given prescription for Cipro and metronidazole just in case she has another episode so that can start therapy early in be changed to course of her illness. Labs CBC with differential and comprehensive chemistry panel in 3 months. Office visit in 6 months.

## 2020-07-04 NOTE — Patient Instructions (Addendum)
Take dicyclomine 10 mg by mouth 30 minutes before breakfast.  If 1 dose does not help can take 20 mg at the same time or can split dose.  10 mg before breakfast and 10 mg before lunch. Can begin antibiotic if you have symptoms suggestive of diverticulitis and also call office.

## 2020-07-05 ENCOUNTER — Other Ambulatory Visit (HOSPITAL_COMMUNITY): Payer: Self-pay | Admitting: Obstetrics and Gynecology

## 2020-07-05 DIAGNOSIS — Z1231 Encounter for screening mammogram for malignant neoplasm of breast: Secondary | ICD-10-CM

## 2020-07-10 DIAGNOSIS — D1801 Hemangioma of skin and subcutaneous tissue: Secondary | ICD-10-CM | POA: Diagnosis not present

## 2020-07-10 DIAGNOSIS — L812 Freckles: Secondary | ICD-10-CM | POA: Diagnosis not present

## 2020-07-10 DIAGNOSIS — D2271 Melanocytic nevi of right lower limb, including hip: Secondary | ICD-10-CM | POA: Diagnosis not present

## 2020-07-10 DIAGNOSIS — L821 Other seborrheic keratosis: Secondary | ICD-10-CM | POA: Diagnosis not present

## 2020-07-10 DIAGNOSIS — L738 Other specified follicular disorders: Secondary | ICD-10-CM | POA: Diagnosis not present

## 2020-07-10 DIAGNOSIS — L814 Other melanin hyperpigmentation: Secondary | ICD-10-CM | POA: Diagnosis not present

## 2020-07-10 DIAGNOSIS — D225 Melanocytic nevi of trunk: Secondary | ICD-10-CM | POA: Diagnosis not present

## 2020-07-10 DIAGNOSIS — D224 Melanocytic nevi of scalp and neck: Secondary | ICD-10-CM | POA: Diagnosis not present

## 2020-07-30 MED FILL — ESCITALOPRAM 10 MG TABLET: 10 | 30 days supply | Qty: 45 | Fill #3

## 2020-07-30 MED FILL — MESALAMINE ER 0.375 GM CP24: 0.375 | 30 days supply | Qty: 120 | Fill #1

## 2020-07-30 MED FILL — DICYCLOMINE 10 MG CAPSULE: 10 | 30 days supply | Qty: 60 | Fill #1

## 2020-08-23 DIAGNOSIS — Z20828 Contact with and (suspected) exposure to other viral communicable diseases: Secondary | ICD-10-CM | POA: Diagnosis not present

## 2020-08-26 ENCOUNTER — Ambulatory Visit (HOSPITAL_COMMUNITY)
Admission: RE | Admit: 2020-08-26 | Discharge: 2020-08-26 | Disposition: A | Discharge status: Home / Self Care | Payer: 59 | Source: Ambulatory Visit | Attending: Obstetrics and Gynecology | Admitting: Obstetrics and Gynecology

## 2020-08-26 ENCOUNTER — Other Ambulatory Visit: Payer: Self-pay

## 2020-08-26 DIAGNOSIS — Z1231 Encounter for screening mammogram for malignant neoplasm of breast: Secondary | ICD-10-CM | POA: Insufficient documentation

## 2020-08-27 ENCOUNTER — Other Ambulatory Visit (HOSPITAL_COMMUNITY): Payer: Self-pay | Admitting: Obstetrics and Gynecology

## 2020-08-27 MED FILL — ESCITALOPRAM 10 MG TABLET: 10 | 30 days supply | Qty: 45 | Fill #4

## 2020-08-27 MED FILL — MESALAMINE ER 0.375 GM CP24: 0.375 | 30 days supply | Qty: 120 | Fill #2

## 2020-08-28 ENCOUNTER — Other Ambulatory Visit (HOSPITAL_COMMUNITY): Payer: Self-pay | Admitting: Obstetrics and Gynecology

## 2020-08-28 DIAGNOSIS — R928 Other abnormal and inconclusive findings on diagnostic imaging of breast: Secondary | ICD-10-CM

## 2020-09-03 ENCOUNTER — Ambulatory Visit (HOSPITAL_COMMUNITY)
Admission: RE | Admit: 2020-09-03 | Discharge: 2020-09-03 | Disposition: A | Discharge status: Home / Self Care | Payer: 59 | Source: Ambulatory Visit | Attending: Obstetrics and Gynecology | Admitting: Obstetrics and Gynecology

## 2020-09-03 ENCOUNTER — Other Ambulatory Visit: Payer: Self-pay

## 2020-09-03 DIAGNOSIS — R922 Inconclusive mammogram: Secondary | ICD-10-CM | POA: Diagnosis not present

## 2020-09-03 DIAGNOSIS — R928 Other abnormal and inconclusive findings on diagnostic imaging of breast: Secondary | ICD-10-CM | POA: Diagnosis not present

## 2020-09-11 ENCOUNTER — Other Ambulatory Visit (INDEPENDENT_AMBULATORY_CARE_PROVIDER_SITE_OTHER): Payer: Self-pay | Admitting: *Deleted

## 2020-09-11 DIAGNOSIS — Z8719 Personal history of other diseases of the digestive system: Secondary | ICD-10-CM

## 2020-09-25 MED FILL — ESCITALOPRAM 10 MG TABLET: 10 | 30 days supply | Qty: 45 | Fill #5

## 2020-09-25 MED FILL — MESALAMINE ER 0.375 GM CP24: 0.375 | 30 days supply | Qty: 120 | Fill #3

## 2020-09-25 MED FILL — DICYCLOMINE 10 MG CAPSULE: 10 | 30 days supply | Qty: 60 | Fill #2

## 2020-09-26 ENCOUNTER — Other Ambulatory Visit (HOSPITAL_COMMUNITY): Payer: Self-pay | Admitting: Adult Health Nurse Practitioner

## 2020-09-26 MED FILL — ALPRAZolam 0.5 MG TABS: 0.5 | 30 days supply | Qty: 60 | Fill #0

## 2020-10-03 ENCOUNTER — Other Ambulatory Visit (HOSPITAL_COMMUNITY)
Admission: RE | Admit: 2020-10-03 | Discharge: 2020-10-03 | Disposition: A | Discharge status: Home / Self Care | Payer: 59 | Source: Ambulatory Visit | Attending: Internal Medicine | Admitting: Internal Medicine

## 2020-10-03 DIAGNOSIS — Z8719 Personal history of other diseases of the digestive system: Secondary | ICD-10-CM | POA: Insufficient documentation

## 2020-10-03 LAB — CBC WITH DIFFERENTIAL/PLATELET
Abs Immature Granulocytes: 0.01 10*3/uL (ref 0.00–0.07)
Basophils Absolute: 0.1 10*3/uL (ref 0.0–0.1)
Basophils Relative: 1 %
Eosinophils Absolute: 0.4 10*3/uL (ref 0.0–0.5)
Eosinophils Relative: 5 %
HCT: 43.3 % (ref 36.0–46.0)
Hemoglobin: 14.8 g/dL (ref 12.0–15.0)
Immature Granulocytes: 0 %
Lymphocytes Relative: 35 %
Lymphs Abs: 2.5 10*3/uL (ref 0.7–4.0)
MCH: 29.8 pg (ref 26.0–34.0)
MCHC: 34.2 g/dL (ref 30.0–36.0)
MCV: 87.3 fL (ref 80.0–100.0)
Monocytes Absolute: 0.5 10*3/uL (ref 0.1–1.0)
Monocytes Relative: 7 %
Neutro Abs: 3.7 10*3/uL (ref 1.7–7.7)
Neutrophils Relative %: 52 %
Platelets: 236 10*3/uL (ref 150–400)
RBC: 4.96 MIL/uL (ref 3.87–5.11)
RDW: 13.3 % (ref 11.5–15.5)
WBC: 7.2 10*3/uL (ref 4.0–10.5)
nRBC: 0 % (ref 0.0–0.2)

## 2020-10-03 LAB — COMPREHENSIVE METABOLIC PANEL WITH GFR
ALT: 21 U/L (ref 0–44)
AST: 16 U/L (ref 15–41)
Albumin: 3.9 g/dL (ref 3.5–5.0)
Alkaline Phosphatase: 59 U/L (ref 38–126)
Anion gap: 7 (ref 5–15)
BUN: 10 mg/dL (ref 6–20)
CO2: 25 mmol/L (ref 22–32)
Calcium: 8.6 mg/dL — ABNORMAL LOW (ref 8.9–10.3)
Chloride: 106 mmol/L (ref 98–111)
Creatinine, Ser: 0.88 mg/dL (ref 0.44–1.00)
GFR, Estimated: >60 mL/min
Glucose, Bld: 118 mg/dL — ABNORMAL HIGH (ref 70–99)
Potassium: 4.1 mmol/L (ref 3.5–5.1)
Sodium: 138 mmol/L (ref 135–145)
Total Bilirubin: 0.4 mg/dL (ref 0.3–1.2)
Total Protein: 6.8 g/dL (ref 6.5–8.1)

## 2020-10-15 ENCOUNTER — Telehealth: Payer: 59 | Admitting: Physician Assistant

## 2020-10-15 DIAGNOSIS — M542 Cervicalgia: Secondary | ICD-10-CM

## 2020-10-15 NOTE — Progress Notes (Signed)
Based on what you shared with me, I feel your condition warrants further evaluation and I recommend that you be seen for a face to face office visit.  Though this is likely a strain of your neck, given the mechanism you should have a face to face evaluation to ensure a normal neurologic exam and possibly x-rays depending on the finding of your exam.   NOTE: If you entered your credit card information for this eVisit, you will not be charged. You may see a "hold" on your card for the $35 but that hold will drop off and you will not have a charge processed.   If you are having a true medical emergency please call 911.      For an urgent face to face visit, [facility] has five urgent care centers for your convenience:     [facility] [place] Get Driving Directions [phone] [address] [place], [place] [address] . 10 am - 6pm Monday - Friday    [facility]) Get Driving Directions [phone] [address] [place], [place] [address] . 10 am to 8 pm Monday-Friday . 12 pm to 8 pm [facility] Get Driving Directions [phone] [place], [place], [place] [address] . 8 am to 8 pm Monday-Friday . 9 am to 6 pm Saturday . 11 am to 6 pm Sunday     [facility] Get Driving Directions  [phone] [address] [place], [place] [address] . 8 am to 8 pm Monday-Friday . 8 am to 4 pm [facility] [place] Get Driving Directions [phone] [address] [place], [place] [address] . 12 pm to 6 pm Monday-Friday      Your e-visit answers were reviewed by a board certified advanced clinical practitioner to complete your personal care plan.  Thank you for using e-Visits.   Greater than 5 minutes, yet less than 10 minutes of time have been spent  researching, coordinating, and implementing care for this patient today

## 2020-10-16 DIAGNOSIS — M542 Cervicalgia: Secondary | ICD-10-CM | POA: Diagnosis not present

## 2020-10-21 MED FILL — ESCITALOPRAM 10 MG TABLET: 10 | 30 days supply | Qty: 45 | Fill #6

## 2020-10-21 MED FILL — MESALAMINE ER 0.375 GM CP24: 0.375 | 30 days supply | Qty: 120 | Fill #4

## 2020-11-26 MED FILL — ESCITALOPRAM 10 MG TABLET: 10 | 30 days supply | Qty: 45 | Fill #7

## 2021-01-06 ENCOUNTER — Other Ambulatory Visit (HOSPITAL_COMMUNITY): Payer: Self-pay | Admitting: Adult Health Nurse Practitioner

## 2021-01-06 DIAGNOSIS — F411 Generalized anxiety disorder: Secondary | ICD-10-CM | POA: Diagnosis not present

## 2021-01-06 MED FILL — ALPRAZolam 1 MG TABS: 1 | 30 days supply | Qty: 60 | Fill #0

## 2021-01-07 MED FILL — ESCITALOPRAM 10 MG TABLET: 10 | 90 days supply | Qty: 135 | Fill #0

## 2021-01-13 MED FILL — MESALAMINE ER 0.375 GM CP24: 0.375 | 30 days supply | Qty: 120 | Fill #5

## 2021-01-14 ENCOUNTER — Ambulatory Visit (INDEPENDENT_AMBULATORY_CARE_PROVIDER_SITE_OTHER): Payer: 59 | Admitting: Internal Medicine

## 2021-01-29 ENCOUNTER — Other Ambulatory Visit (HOSPITAL_COMMUNITY): Payer: Self-pay | Admitting: Pharmacist

## 2021-01-29 MED FILL — CARESTART COVID-19 HOME TES: 4 days supply | Qty: 4 | Fill #0

## 2021-02-14 ENCOUNTER — Other Ambulatory Visit (HOSPITAL_COMMUNITY): Payer: Self-pay | Admitting: Adult Health Nurse Practitioner

## 2021-02-14 DIAGNOSIS — Z0000 Encounter for general adult medical examination without abnormal findings: Secondary | ICD-10-CM | POA: Diagnosis not present

## 2021-02-14 DIAGNOSIS — Z6841 Body Mass Index (BMI) 40.0 and over, adult: Secondary | ICD-10-CM | POA: Diagnosis not present

## 2021-02-14 DIAGNOSIS — Z833 Family history of diabetes mellitus: Secondary | ICD-10-CM | POA: Diagnosis not present

## 2021-02-14 DIAGNOSIS — Z13220 Encounter for screening for lipoid disorders: Secondary | ICD-10-CM | POA: Diagnosis not present

## 2021-02-14 DIAGNOSIS — Z136 Encounter for screening for cardiovascular disorders: Secondary | ICD-10-CM | POA: Diagnosis not present

## 2021-02-14 DIAGNOSIS — F411 Generalized anxiety disorder: Secondary | ICD-10-CM | POA: Diagnosis not present

## 2021-02-14 MED FILL — DESVENLAFAXINE SUC ER 25 MG: 25 | 30 days supply | Qty: 30 | Fill #0

## 2021-02-17 MED FILL — MESALAMINE ER 0.375 GM CP24: 0.375 | 30 days supply | Qty: 120 | Fill #6

## 2021-02-21 MED FILL — SAXENDA 18 MG/3 ML PEN: 18 | 90 days supply | Qty: 9 | Fill #0

## 2021-02-22 ENCOUNTER — Other Ambulatory Visit (HOSPITAL_COMMUNITY): Payer: Self-pay

## 2021-03-04 ENCOUNTER — Other Ambulatory Visit (HOSPITAL_COMMUNITY): Payer: Self-pay

## 2021-03-07 ENCOUNTER — Other Ambulatory Visit (HOSPITAL_COMMUNITY): Payer: Self-pay

## 2021-03-10 MED FILL — Mesalamine Cap ER 24HR 0.375 GM: ORAL | 30 days supply | Qty: 120 | Fill #0 | Status: CN

## 2021-03-10 MED FILL — Desvenlafaxine Succinate Tab ER 24HR 25 MG (Base Equiv): ORAL | 30 days supply | Qty: 30 | Fill #0 | Status: AC

## 2021-03-11 ENCOUNTER — Other Ambulatory Visit (HOSPITAL_COMMUNITY): Payer: Self-pay

## 2021-03-12 ENCOUNTER — Other Ambulatory Visit (HOSPITAL_COMMUNITY): Payer: Self-pay

## 2021-03-12 MED FILL — Mesalamine Cap ER 24HR 0.375 GM: ORAL | 30 days supply | Qty: 120 | Fill #0 | Status: AC

## 2021-03-13 ENCOUNTER — Other Ambulatory Visit (HOSPITAL_COMMUNITY): Payer: Self-pay

## 2021-03-26 ENCOUNTER — Other Ambulatory Visit (HOSPITAL_COMMUNITY): Payer: Self-pay

## 2021-03-26 MED ORDER — SAXENDA 18 MG/3ML ~~LOC~~ SOPN
3.0000 mg | PEN_INJECTOR | Freq: Every day | SUBCUTANEOUS | 5 refills | Status: DC
  Filled 2021-03-26: qty 15, 30d supply, fill #0
  Filled 2021-03-27: qty 3, 6d supply, fill #0
  Filled 2021-03-27 (×2): qty 15, 30d supply, fill #0
  Filled 2021-04-27: qty 15, 30d supply, fill #1

## 2021-03-27 ENCOUNTER — Other Ambulatory Visit (HOSPITAL_COMMUNITY): Payer: Self-pay

## 2021-04-05 MED FILL — Desvenlafaxine Succinate Tab ER 24HR 25 MG (Base Equiv): ORAL | 30 days supply | Qty: 30 | Fill #1 | Status: AC

## 2021-04-07 ENCOUNTER — Other Ambulatory Visit (HOSPITAL_COMMUNITY): Payer: Self-pay

## 2021-04-15 ENCOUNTER — Other Ambulatory Visit (HOSPITAL_COMMUNITY): Payer: Self-pay

## 2021-04-15 ENCOUNTER — Other Ambulatory Visit: Payer: Self-pay

## 2021-04-15 ENCOUNTER — Encounter (INDEPENDENT_AMBULATORY_CARE_PROVIDER_SITE_OTHER): Payer: Self-pay | Admitting: Internal Medicine

## 2021-04-15 ENCOUNTER — Ambulatory Visit (INDEPENDENT_AMBULATORY_CARE_PROVIDER_SITE_OTHER): Payer: 59 | Admitting: Internal Medicine

## 2021-04-15 VITALS — BP 133/89 | HR 94 | Temp 98.5°F | Ht 67.0 in | Wt 274.2 lb

## 2021-04-15 DIAGNOSIS — Z8719 Personal history of other diseases of the digestive system: Secondary | ICD-10-CM | POA: Diagnosis not present

## 2021-04-15 MED ORDER — METRONIDAZOLE 500 MG PO TABS
500.0000 mg | ORAL_TABLET | Freq: Two times a day (BID) | ORAL | 0 refills | Status: DC
  Filled 2021-04-15: qty 20, 10d supply, fill #0

## 2021-04-15 MED ORDER — MESALAMINE ER 0.375 G PO CP24
1.5000 g | ORAL_CAPSULE | Freq: Every day | ORAL | 11 refills | Status: DC
  Filled 2021-04-15: qty 120, 30d supply, fill #0
  Filled 2021-09-17: qty 120, 30d supply, fill #1
  Filled 2021-10-29: qty 120, 30d supply, fill #2
  Filled 2021-12-11: qty 120, 30d supply, fill #3
  Filled 2022-02-01: qty 120, 30d supply, fill #4

## 2021-04-15 MED ORDER — CIPROFLOXACIN HCL 500 MG PO TABS
500.0000 mg | ORAL_TABLET | Freq: Two times a day (BID) | ORAL | 0 refills | Status: DC
  Filled 2021-04-15: qty 20, 10d supply, fill #0

## 2021-04-15 NOTE — Progress Notes (Signed)
Presenting complaint;  History of recurrent diverticulitis.  Database and subjective:  Cynthia Ochoa is 45-year-old Caucasian female who who has a history of recurrent diverticulitis and is here for scheduled visit.  She was last seen on 07/04/2020. She has had 4 episodes of diverticulitis between November 2016 and July 2021.  She was hospitalized twice for contained perforation and seen in emergency room with a third episode in 1 episode she was treated on an outpatient basis.  She was seen by Dr. Bridges.  Surgery was considered but she wanted to wait.  On her last visit she was begun on oral mesalamine which she has been able to tolerate without any side effects.  She says she is doing well.  Her bowels move daily.  She does not have diarrhea and/or constipation.  She does notice intermittent pain at the left upper quadrant usually with defecation but pain can also occur at other times.  It never lasts more than 30 minutes.  She is watching her diet.  She has increase intake of fiber rich foods particularly fruits.  She just does not do well with salads.  She takes fiber Gummies daily.  She has cut back on intake of red meat. She had an episode of left upper quadrant pain in February 2022.  She was begun on antibiotic which was stopped 3 days later after she realized that pain started after she had moved furniture and possibly pulled a muscle. She is on Saxenda and has managed to lose 12 pounds in 1 month.  She does not take any NSAIDs. She remains under a lot of stress on account of her brother's illness who at home and requires full-time assistance.  Current Medications: Outpatient Encounter Medications as of 04/15/2021  Medication Sig  . ciprofloxacin (CIPRO) 500 MG tablet Take 1 tablet (500 mg total) by mouth 2 (two) times daily.  . Desvenlafaxine Succinate ER 25 MG TB24 TAKE ONE TABLET (25 MG DOSE) BY MOUTH DAILY.  . FIBER SELECT GUMMIES PO Take by mouth. 10 gram 4 gummies once a day  .  Lactobacillus (PROBIOTIC ACIDOPHILUS) TABS Take 1 tablet by mouth 2 (two) times daily.  . Liraglutide -Weight Management (SAXENDA) 18 MG/3ML SOPN Inject 3 mg into the skin daily.  . Liraglutide -Weight Management 18 MG/3ML SOPN INJECT 0.1 MLS (0.6 MG DOSE) INTO THE SKIN DAILY.  . mesalamine (APRISO) 0.375 g 24 hr capsule TAKE 4 CAPSULES (1.5 G TOTAL) BY MOUTH DAILY.  . metroNIDAZOLE (FLAGYL) 500 MG tablet Take 1 tablet (500 mg total) by mouth 2 (two) times daily.  . dicyclomine (BENTYL) 10 MG capsule TAKE 1 CAPSULE BY MOUTH TWICE DAILY BEFORE MEALS. (Patient not taking: Reported on 04/15/2021)  . [DISCONTINUED] ALPRAZolam (XANAX) 0.5 MG tablet Take 0.5 mg by mouth 2 (two) times daily as needed for anxiety. (Patient not taking: Reported on 04/15/2021)  . [DISCONTINUED] ALPRAZolam (XANAX) 1 MG tablet TAKE 1 TABLET BY MOUTH TWICE A DAY AS NEEDED FOR PANIC ATTACKS (Patient not taking: Reported on 04/15/2021)  . [DISCONTINUED] COVID-19 At Home Antigen Test KIT USE AS DIRECTED WITHIN PACKAGE INSERT (Patient taking differently: USE AS DIRECTED WITHIN PACKAGE INSERT)  . [DISCONTINUED] Diethylpropion HCl CR 75 MG TB24 Take 1 tablet by mouth daily. (Patient not taking: Reported on 04/15/2021)  . [DISCONTINUED] doxycycline (VIBRAMYCIN) 100 MG capsule Take 100 mg by mouth daily. (Patient not taking: No sig reported)  . [DISCONTINUED] escitalopram (LEXAPRO) 10 MG tablet Take 1.5 tablets (15 mg total) by mouth daily. (Patient not   taking: Reported on 04/15/2021)  . [DISCONTINUED] escitalopram (LEXAPRO) 10 MG tablet TAKE 1 & 1/2 TABLETS BY MOUTH DAILY (Patient not taking: Reported on 04/15/2021)  . [DISCONTINUED] escitalopram (LEXAPRO) 10 MG tablet TAKE 1 & 1/2 TABLETS BY MOUTH DAILY (Patient not taking: Reported on 04/15/2021)   No facility-administered encounter medications on file as of 04/15/2021.     Objective: Blood pressure 133/89, pulse 94, temperature 98.5 F (36.9 C), temperature source Oral, height 5' 7"  (1.702 m), weight 274 lb 3.2 oz (124.4 kg). Patient is alert and in no acute distress. She is wearing a mask. Conjunctiva is pink. Sclera is nonicteric Oropharyngeal mucosa is normal. No neck masses or thyromegaly noted. Cardiac exam with regular rhythm normal S1 and S2. No murmur or gallop noted. Lungs are clear to auscultation. Abdomen is full.  On palpation it soft and nontender with organomegaly or masses. No LE edema or clubbing noted.  Labs/studies Results:  Colonoscopy in February 2017 by Dr. Sandi Fields reveal scattered diverticula at hepatic flexure, transverse colon,'s descending and sigmoid colon.   Assessment:  #1.  Recurrent colonic diverticulitis.  All in all she has had 4 episodes in the last 5-1/2 years.  She required hospitalization for 2 of these episodes.  Surgery has been considered but on hold considering that she may need more than just sigmoid colectomy.  She was begun on oral mesalamine 9 months ago.  So far so good.  She is aware that it is off label use of the medication and anecdotal data show some promise but there is no well-defined studies.  In my opinion it would make sense to use mesalamine considering its mechanism of action and healing effect on colonic mucosa.  Plan:  Patient will continue high-fiber diet and fiber Gummies as before. New prescription given for Apriso/mesalamine 1500 mg by mouth daily.  1 month supply with 11 refills. Prescription also given for Cipro and metronidazole which she needs to have on hand just in case. If she continues to do well she will return for office visit in 1 year.     

## 2021-04-15 NOTE — Patient Instructions (Signed)
Patient is alert avoid NSAID use. She will begin antibiotic she feels she has other episode initially and notify us.

## 2021-04-16 ENCOUNTER — Other Ambulatory Visit (HOSPITAL_COMMUNITY): Payer: Self-pay

## 2021-04-28 ENCOUNTER — Other Ambulatory Visit (HOSPITAL_COMMUNITY): Payer: Self-pay

## 2021-04-28 MED ORDER — SAXENDA 18 MG/3ML ~~LOC~~ SOPN
3.0000 mg | PEN_INJECTOR | Freq: Every day | SUBCUTANEOUS | 5 refills | Status: DC
  Filled 2021-04-28: qty 3, 6d supply, fill #0
  Filled 2021-05-01: qty 15, 30d supply, fill #0
  Filled 2021-05-01: qty 3, 6d supply, fill #0
  Filled 2021-05-02: qty 15, 30d supply, fill #0

## 2021-04-30 ENCOUNTER — Other Ambulatory Visit (HOSPITAL_COMMUNITY): Payer: Self-pay

## 2021-05-01 ENCOUNTER — Other Ambulatory Visit (HOSPITAL_COMMUNITY): Payer: Self-pay

## 2021-05-02 ENCOUNTER — Other Ambulatory Visit (HOSPITAL_COMMUNITY): Payer: Self-pay

## 2021-05-02 MED ORDER — SAXENDA 18 MG/3ML ~~LOC~~ SOPN
3.0000 mg | PEN_INJECTOR | Freq: Every day | SUBCUTANEOUS | 5 refills | Status: DC
  Filled 2021-05-02: qty 15, 30d supply, fill #0
  Filled 2021-05-31: qty 15, 30d supply, fill #1
  Filled 2021-06-29 – 2021-07-08 (×4): qty 15, 30d supply, fill #2
  Filled 2021-10-29: qty 15, 30d supply, fill #3
  Filled 2021-12-11: qty 15, 30d supply, fill #4
  Filled 2022-02-01: qty 15, 30d supply, fill #5

## 2021-05-02 MED ORDER — SAXENDA 18 MG/3ML ~~LOC~~ SOPN
3.0000 mg | PEN_INJECTOR | Freq: Every day | SUBCUTANEOUS | 5 refills | Status: DC
  Filled 2021-05-02: qty 15, 30d supply, fill #0
  Filled 2021-07-09: qty 3, 6d supply, fill #0

## 2021-05-04 MED FILL — Desvenlafaxine Succinate Tab ER 24HR 25 MG (Base Equiv): ORAL | 30 days supply | Qty: 30 | Fill #2 | Status: AC

## 2021-05-05 ENCOUNTER — Other Ambulatory Visit (HOSPITAL_COMMUNITY): Payer: Self-pay

## 2021-05-15 ENCOUNTER — Other Ambulatory Visit (INDEPENDENT_AMBULATORY_CARE_PROVIDER_SITE_OTHER): Payer: Self-pay | Admitting: Internal Medicine

## 2021-05-15 MED ORDER — ONDANSETRON HCL 4 MG PO TABS: 4.0000 mg | ORAL_TABLET | Freq: Three times a day (TID) | ORAL | 0 refills | Status: DC | PRN

## 2021-05-15 NOTE — Telephone Encounter (Signed)
[place] that [place] had nausea vomiting diarrhea and chills started 2 days ago.  No abdominal pain. She possibly has viral gastroenteritis or foodborne illness. Prescription for Zofran 4 mg 3 times daily as needed sent [facility] in [facility]. If symptoms persist she will need to go to ER for IV fluids and studies to include GI pathogen panel.

## 2021-05-31 MED FILL — Desvenlafaxine Succinate Tab ER 24HR 25 MG (Base Equiv): ORAL | 30 days supply | Qty: 30 | Fill #3 | Status: AC

## 2021-06-02 ENCOUNTER — Other Ambulatory Visit (HOSPITAL_COMMUNITY): Payer: Self-pay

## 2021-06-03 ENCOUNTER — Other Ambulatory Visit (HOSPITAL_COMMUNITY): Payer: Self-pay

## 2021-06-17 ENCOUNTER — Other Ambulatory Visit (HOSPITAL_COMMUNITY): Payer: Self-pay

## 2021-06-17 DIAGNOSIS — I10 Essential (primary) hypertension: Secondary | ICD-10-CM | POA: Diagnosis not present

## 2021-06-17 MED ORDER — LOSARTAN POTASSIUM 25 MG PO TABS
25.0000 mg | ORAL_TABLET | Freq: Every day | ORAL | 5 refills | Status: DC
  Filled 2021-06-17: qty 30, 30d supply, fill #0

## 2021-06-18 ENCOUNTER — Other Ambulatory Visit (HOSPITAL_COMMUNITY): Payer: Self-pay

## 2021-06-29 MED FILL — Desvenlafaxine Succinate Tab ER 24HR 25 MG (Base Equiv): ORAL | 30 days supply | Qty: 30 | Fill #4 | Status: AC

## 2021-06-30 ENCOUNTER — Other Ambulatory Visit (HOSPITAL_COMMUNITY): Payer: Self-pay

## 2021-06-30 MED ORDER — LOSARTAN POTASSIUM-HCTZ 50-12.5 MG PO TABS
1.0000 | ORAL_TABLET | Freq: Every day | ORAL | 5 refills | Status: DC
  Filled 2021-06-30: qty 30, 30d supply, fill #0

## 2021-07-01 ENCOUNTER — Other Ambulatory Visit (HOSPITAL_COMMUNITY): Payer: Self-pay

## 2021-07-01 DIAGNOSIS — Z6841 Body Mass Index (BMI) 40.0 and over, adult: Secondary | ICD-10-CM | POA: Diagnosis not present

## 2021-07-01 DIAGNOSIS — Z760 Encounter for issue of repeat prescription: Secondary | ICD-10-CM | POA: Diagnosis not present

## 2021-07-01 DIAGNOSIS — Z01419 Encounter for gynecological examination (general) (routine) without abnormal findings: Secondary | ICD-10-CM | POA: Diagnosis not present

## 2021-07-01 MED ORDER — CLINDAMYCIN PHOSPHATE 1 % EX LOTN
TOPICAL_LOTION | Freq: Two times a day (BID) | CUTANEOUS | 3 refills | Status: DC
  Filled 2021-07-01: qty 60, 25d supply, fill #0

## 2021-07-01 MED ORDER — DOXYCYCLINE HYCLATE 100 MG PO CAPS
100.0000 mg | ORAL_CAPSULE | Freq: Every day | ORAL | 3 refills | Status: DC
  Filled 2021-07-01: qty 30, 30d supply, fill #0
  Filled 2021-10-22: qty 30, 30d supply, fill #1

## 2021-07-02 ENCOUNTER — Other Ambulatory Visit (HOSPITAL_COMMUNITY): Payer: Self-pay

## 2021-07-02 MED ORDER — CLINDAMYCIN PHOSPHATE 1 % EX SOLN
CUTANEOUS | 3 refills | Status: DC
  Filled 2021-07-02: qty 60, 30d supply, fill #0

## 2021-07-07 ENCOUNTER — Other Ambulatory Visit (HOSPITAL_COMMUNITY): Payer: Self-pay

## 2021-07-08 ENCOUNTER — Other Ambulatory Visit (HOSPITAL_COMMUNITY): Payer: Self-pay

## 2021-07-09 ENCOUNTER — Other Ambulatory Visit (HOSPITAL_COMMUNITY): Payer: Self-pay

## 2021-07-14 ENCOUNTER — Other Ambulatory Visit (HOSPITAL_COMMUNITY): Payer: Self-pay | Admitting: Obstetrics and Gynecology

## 2021-07-14 DIAGNOSIS — Z1231 Encounter for screening mammogram for malignant neoplasm of breast: Secondary | ICD-10-CM

## 2021-07-15 DIAGNOSIS — D2272 Melanocytic nevi of left lower limb, including hip: Secondary | ICD-10-CM | POA: Diagnosis not present

## 2021-07-15 DIAGNOSIS — D225 Melanocytic nevi of trunk: Secondary | ICD-10-CM | POA: Diagnosis not present

## 2021-07-15 DIAGNOSIS — D2261 Melanocytic nevi of right upper limb, including shoulder: Secondary | ICD-10-CM | POA: Diagnosis not present

## 2021-07-15 DIAGNOSIS — L821 Other seborrheic keratosis: Secondary | ICD-10-CM | POA: Diagnosis not present

## 2021-07-15 DIAGNOSIS — L814 Other melanin hyperpigmentation: Secondary | ICD-10-CM | POA: Diagnosis not present

## 2021-07-15 DIAGNOSIS — D2271 Melanocytic nevi of right lower limb, including hip: Secondary | ICD-10-CM | POA: Diagnosis not present

## 2021-07-15 DIAGNOSIS — D2262 Melanocytic nevi of left upper limb, including shoulder: Secondary | ICD-10-CM | POA: Diagnosis not present

## 2021-07-25 ENCOUNTER — Other Ambulatory Visit (HOSPITAL_COMMUNITY): Payer: Self-pay

## 2021-07-25 MED ORDER — METOPROLOL SUCCINATE ER 50 MG PO TB24
50.0000 mg | ORAL_TABLET | Freq: Every day | ORAL | 5 refills | Status: DC
  Filled 2021-07-25: qty 30, 30d supply, fill #0

## 2021-07-29 ENCOUNTER — Other Ambulatory Visit (HOSPITAL_COMMUNITY): Payer: Self-pay

## 2021-07-29 MED ORDER — DESVENLAFAXINE SUCCINATE ER 25 MG PO TB24
25.0000 mg | ORAL_TABLET | Freq: Every day | ORAL | 5 refills | Status: DC
  Filled 2021-07-29: qty 30, 30d supply, fill #0
  Filled 2021-08-24: qty 30, 30d supply, fill #1
  Filled 2021-09-22: qty 30, 30d supply, fill #2
  Filled 2021-10-22: qty 30, 30d supply, fill #3
  Filled 2021-11-29: qty 30, 30d supply, fill #4
  Filled 2021-12-31: qty 30, 30d supply, fill #5

## 2021-08-19 ENCOUNTER — Other Ambulatory Visit (HOSPITAL_COMMUNITY): Payer: Self-pay

## 2021-08-19 MED ORDER — METOPROLOL SUCCINATE ER 50 MG PO TB24
50.0000 mg | ORAL_TABLET | Freq: Every day | ORAL | 5 refills | Status: DC
  Filled 2021-08-19: qty 90, 90d supply, fill #0
  Filled 2021-11-11: qty 90, 90d supply, fill #1

## 2021-08-25 ENCOUNTER — Other Ambulatory Visit (HOSPITAL_COMMUNITY): Payer: Self-pay

## 2021-08-27 ENCOUNTER — Other Ambulatory Visit (HOSPITAL_COMMUNITY): Payer: Self-pay

## 2021-08-27 MED ORDER — CLONAZEPAM 0.5 MG PO TABS
0.5000 mg | ORAL_TABLET | Freq: Two times a day (BID) | ORAL | 0 refills | Status: DC
  Filled 2021-08-27: qty 60, 30d supply, fill #0

## 2021-09-05 ENCOUNTER — Ambulatory Visit (HOSPITAL_COMMUNITY)
Admission: RE | Admit: 2021-09-05 | Discharge: 2021-09-05 | Disposition: A | Discharge status: Home / Self Care | Payer: 59 | Source: Ambulatory Visit | Attending: Obstetrics and Gynecology | Admitting: Obstetrics and Gynecology

## 2021-09-05 ENCOUNTER — Other Ambulatory Visit: Payer: Self-pay

## 2021-09-05 DIAGNOSIS — Z1231 Encounter for screening mammogram for malignant neoplasm of breast: Secondary | ICD-10-CM | POA: Diagnosis not present

## 2021-09-17 ENCOUNTER — Other Ambulatory Visit (HOSPITAL_COMMUNITY): Payer: Self-pay

## 2021-09-23 ENCOUNTER — Other Ambulatory Visit (HOSPITAL_COMMUNITY): Payer: Self-pay

## 2021-10-22 ENCOUNTER — Other Ambulatory Visit (HOSPITAL_COMMUNITY): Payer: Self-pay

## 2021-10-30 ENCOUNTER — Other Ambulatory Visit (HOSPITAL_COMMUNITY): Payer: Self-pay

## 2021-11-12 ENCOUNTER — Other Ambulatory Visit (HOSPITAL_COMMUNITY): Payer: Self-pay

## 2021-12-01 ENCOUNTER — Other Ambulatory Visit (HOSPITAL_COMMUNITY): Payer: Self-pay

## 2021-12-12 ENCOUNTER — Other Ambulatory Visit (HOSPITAL_COMMUNITY): Payer: Self-pay

## 2022-01-01 ENCOUNTER — Other Ambulatory Visit (HOSPITAL_COMMUNITY): Payer: Self-pay

## 2022-01-29 ENCOUNTER — Ambulatory Visit: Payer: 59 | Admitting: Cardiology

## 2022-01-29 ENCOUNTER — Encounter: Payer: Self-pay | Admitting: Cardiology

## 2022-01-29 ENCOUNTER — Ambulatory Visit (INDEPENDENT_AMBULATORY_CARE_PROVIDER_SITE_OTHER): Payer: 59

## 2022-01-29 ENCOUNTER — Other Ambulatory Visit: Payer: Self-pay

## 2022-01-29 VITALS — BP 130/86 | HR 90 | Ht 67.0 in | Wt 262.8 lb

## 2022-01-29 DIAGNOSIS — R000 Tachycardia, unspecified: Secondary | ICD-10-CM | POA: Diagnosis not present

## 2022-01-29 DIAGNOSIS — R002 Palpitations: Secondary | ICD-10-CM

## 2022-01-29 DIAGNOSIS — R0602 Shortness of breath: Secondary | ICD-10-CM | POA: Diagnosis not present

## 2022-01-29 DIAGNOSIS — I10 Essential (primary) hypertension: Secondary | ICD-10-CM

## 2022-01-29 NOTE — Patient Instructions (Signed)
Medication Instructions: ?Your physician recommends that you continue on your current medications as directed. Please refer to the Current Medication list given to you today. ? ? ?Labwork: ?None today ? ?Testing/Procedures: ?Your physician has requested that you have an echocardiogram. Echocardiography is a painless test that uses sound waves to create images of your heart. It provides your doctor with information about the size and shape of your heart and how well your heart?s chambers and valves are working. This procedure takes approximately one hour. There are no restrictions for this procedure. ? ? ?Wear Zio monitor for 3 days ? ?Follow-Up: ?To be determined  ? ?Any Other Special Instructions Will Be Listed Below (If Applicable). ? ?If you need a refill on your cardiac medications before your next appointment, please call your pharmacy. ? ?

## 2022-01-29 NOTE — Progress Notes (Signed)
Cardiology Office Note  Date: 01/29/2022   ID: Cynthia Ochoa, DOB [birth year], MRN [id number]  PCP:  Roe Rutherford, NP  Cardiologist:  None Electrophysiologist:  None   Chief Complaint  Patient presents with   Palpitations    History of Present Illness: Cynthia Ochoa is a 46 y.o. female referred by Ms. Arturo Morton NP for evaluation of palpitations.  She has been treated for hypertension, initially started to experience symptoms of palpitations and shortness of breath when she was taking Hyzaar, ultimately had to be discontinued.  She reports having had heart rates in the 130s for prolonged periods of time.  She was since put on Toprol-XL 50 mg daily which she has tolerated much better.  Concurrently, cutting back caffeine from 4 drinks a day to 1 drink a day and has been dieting with weight loss of 12 pounds in 3 weeks.  She does feel better, heart rate is not as fast, but still wondering about the significance of her elevated heart rate at times.  She does have a family history including atrial fibrillation, SVT, and bicuspid aortic valve.  She has had no sudden dizziness or syncope, no exertional chest pain.  No leg swelling, orthopnea, or PND.  I personally reviewed her ECG today which shows sinus rhythm with decreased R wave progression.  Past Medical History:  Diagnosis Date   Abnormal Pap smear    Diverticulitis    Essential hypertension    Fibroid    Hx of campylobacteriosis 2005   Hx of Salmonella gastroenteritis    IBS (irritable bowel syndrome)     Past Surgical History:  Procedure Laterality Date   CERVICAL CONIZATION W/BX N/A 09/07/2013   Procedure: CONIZATION CERVIX WITH BIOPSY/REMOVAL OF IUD AND INSERTION;  Surgeon: Jeani Hawking, MD;  Location: [facility];  Service: Gynecology;  Laterality: N/A;   COLONOSCOPY  2005 NUR   I/E HEMORRHOIDS, TIC AT HF, NL TI   COLONOSCOPY N/A 12/27/2015   Procedure: COLONOSCOPY;  Surgeon: West Bali, MD;  Location: [facility];  Service: Endoscopy;  Laterality: N/A;  0830   COLPOSCOPY     CYSTECTOMY     abdomen   GYNECOLOGIC CRYOSURGERY     LAPAROSCOPIC APPENDECTOMY N/A 09/14/2016   Procedure: APPENDECTOMY LAPAROSCOPIC;  Surgeon: Franky Macho, MD;  Location: [facility];  Service: General;  Laterality: N/A;   VAGINAL DELIVERY  04/07/13   WISDOM TOOTH EXTRACTION      Current Outpatient Medications  Medication Sig Dispense Refill   ciprofloxacin (CIPRO) 500 MG tablet Take 1 tablet (500 mg total) by mouth 2 (two) times daily. (Patient taking differently: Take 500 mg by mouth as needed.) 20 tablet 0   clindamycin (CLEOCIN T) 1 % external solution APPLY A THIN LAYER TO THE AFFECTED AREA(S) BY TOPICAL ROUTE 2 TIMES PER DAY (Patient taking differently: Apply 1 application. topically as needed.) 60 mL 3   clonazePAM (KLONOPIN) 0.5 MG tablet Take 1 tablet (0.5 mg total) by mouth 2 (two) times daily. (Patient taking differently: Take 0.5 mg by mouth as needed.) 60 tablet 0   desvenlafaxine (PRISTIQ) 25 MG 24 hr tablet Take 1 tablet (25 mg total) by mouth daily. 30 tablet 5   doxycycline (VIBRAMYCIN) 100 MG capsule Take 1 capsule (100 mg total) by mouth daily. (Patient taking differently: Take 100 mg by mouth as needed.) 30 capsule 3   FIBER SELECT GUMMIES PO Take by mouth. 10 gram 4 gummies once a day  Liraglutide -Weight Management (SAXENDA) 18 MG/3ML SOPN Inject 3 mg into the skin daily. 3 mL 5   mesalamine (APRISO) 0.375 g 24 hr capsule Take 4 capsules (1.5 g total) by mouth daily. 120 capsule 11   metoprolol succinate (TOPROL-XL) 50 MG 24 hr tablet Take 1 tablet (50 mg total) by mouth daily. 30 tablet 5   metroNIDAZOLE (FLAGYL) 500 MG tablet Take 1 tablet (500 mg total) by mouth 2 (two) times daily. (Patient taking differently: Take 500 mg by mouth as needed.) 20 tablet 0   clindamycin (CLEOCIN T) 1 % lotion APPLY TO THE AFFECTED AREA(S) TWO TIMES DAILY 60 mL 3   Desvenlafaxine Succinate ER 25 MG TB24 TAKE ONE TABLET  (25 MG DOSE) BY MOUTH DAILY. 30 tablet 5   dicyclomine (BENTYL) 10 MG capsule TAKE 1 CAPSULE BY MOUTH TWICE DAILY BEFORE MEALS. (Patient not taking: Reported on 04/15/2021) 60 capsule 5   Lactobacillus (PROBIOTIC ACIDOPHILUS) TABS Take 1 tablet by mouth 2 (two) times daily. (Patient not taking: Reported on 01/29/2022) 20 tablet 0   Liraglutide -Weight Management (SAXENDA) 18 MG/3ML SOPN Inject 3 mg (0.5 mLs) into the skin daily. 15 mL 5   losartan (COZAAR) 25 MG tablet Take 1 tablet (25 mg total) by mouth daily. 30 tablet 5   losartan-hydrochlorothiazide (HYZAAR) 50-12.5 MG tablet Take one tablet by mouth daily. 30 tablet 5   metoprolol succinate (TOPROL-XL) 50 MG 24 hr tablet Take 1 tablet (50 mg total) by mouth daily. 30 tablet 5   ondansetron (ZOFRAN) 4 MG tablet Take 1 tablet (4 mg total) by mouth every 8 (eight) hours as needed for nausea or vomiting. 30 tablet 0   No current facility-administered medications for this visit.   Allergies:  Losartan potassium-hctz, Ciprofloxacin, Morphine and related, and Adhesive [tape]   Social History: The patient  reports that she has never smoked. She has never used smokeless tobacco. She reports that she does not drink alcohol and does not use drugs.   Family History: The patient's family history includes COPD in her maternal grandfather and paternal grandmother; Diabetes in her brother; Hypertension in her brother, father, and mother; Melanoma in her paternal uncle; Prader-Willi syndrome in her brother.   ROS: No syncope.  Physical Exam: VS:  BP 130/86   Pulse 90   Ht 5\' 7"  (1.702 m)   Wt 262 lb 12.8 oz (119.2 kg)   SpO2 98%   BMI 41.16 kg/m , BMI Body mass index is 41.16 kg/m.  Wt Readings from Last 3 Encounters:  01/29/22 262 lb 12.8 oz (119.2 kg)  04/15/21 274 lb 3.2 oz (124.4 kg)  07/04/20 269 lb (122 kg)    General: Patient appears comfortable at rest. HEENT: Conjunctiva and lids normal, wearing a mask. Neck: Supple, no elevated JVP or  carotid bruits. Lungs: Clear to auscultation, nonlabored breathing at rest. Cardiac: Regular rate and rhythm, no S3 or significant systolic murmur, no pericardial rub. Abdomen: Bowel sounds present. Extremities: No pitting edema, distal pulses 2+. Skin: Warm and dry. Musculoskeletal: No kyphosis. Neuropsychiatric: Alert and oriented x3, affect grossly appropriate.  ECG:  An ECG dated 05/26/2020 was personally reviewed today and demonstrated:  Sinus tachycardia with lead motion artifact.  Recent Labwork:  March 2022: Hemoglobin 15.1, platelets 252, BUN 10, creatinine 0.77, potassium 4.3, AST 19, ALT 21, hemoglobin A1c 6.6%, cholesterol 174, triglycerides 135, HDL 30, LDL 119, TSH 1.99  Other Studies Reviewed Today:  No prior cardiac testing for review today.  Assessment and  Plan:  1.  Transient sense of palpitations and shortness of breath with generally elevated heart rate at times per discussion above.  She has had no associated chest pain or syncope.  Symptoms were worse while she was on Hyzaar raising possibility of adverse drug reaction.  She does feel better on Toprol-XL.  ECG shows sinus rhythm in the 80s, no preexcitation pattern.  Family history includes atrial fibrillation, SVT, and bicuspid aortic valve.  Plan is to obtain an echocardiogram to ensure structurally normal heart, also place 72-hour Zio patch to better investigate heart rate variability.  For now no changes in current regimen.  2.  Essential hypertension, currently on Toprol-XL.  I reviewed her most recent blood pressure measurements at home which look reasonable.  Medication Adjustments/Labs and Tests Ordered: Current medicines are reviewed at length with the patient today.  Concerns regarding medicines are outlined above.   Tests Ordered: Orders Placed This Encounter  Procedures   LONG TERM MONITOR (3-14 DAYS)   EKG 12-Lead   ECHOCARDIOGRAM COMPLETE    Medication Changes: No orders of the defined types were  placed in this encounter.   Disposition:  Follow up  test results.  Signed, Jonelle Sidle, MD, [facility] 01/29/2022 3:28 PM    [facility] at [facility] [address], [place], [place] [address] Phone: [phone]; Fax: [phone]

## 2022-01-30 ENCOUNTER — Ambulatory Visit (HOSPITAL_COMMUNITY)
Admission: RE | Admit: 2022-01-30 | Discharge: 2022-01-30 | Disposition: A | Discharge status: Home / Self Care | Payer: 59 | Source: Ambulatory Visit | Attending: Cardiology | Admitting: Cardiology

## 2022-01-30 DIAGNOSIS — R0602 Shortness of breath: Secondary | ICD-10-CM | POA: Diagnosis not present

## 2022-01-30 LAB — ECHOCARDIOGRAM COMPLETE
AR max vel: 2.45 cm2
AV Area VTI: 2.43 cm2
AV Area mean vel: 2.13 cm2
AV Mean grad: 3 mmHg
AV Peak grad: 5.5 mmHg
Ao pk vel: 1.17 m/s
Area-P 1/2: 3.85 cm2
Calc EF: 53.9 %
MV VTI: 3.08 cm2
S' Lateral: 2.9 cm
Single Plane A2C EF: 53.2 %
Single Plane A4C EF: 55 %

## 2022-01-30 NOTE — Progress Notes (Incomplete)
*  PRELIMINARY RESULTS* ?Echocardiogram ?2D Echocardiogram has been performed. ? ?Cynthia Ochoa ?01/30/2022, 11:06 AM ?

## 2022-02-01 ENCOUNTER — Other Ambulatory Visit (HOSPITAL_COMMUNITY): Payer: Self-pay

## 2022-02-02 ENCOUNTER — Other Ambulatory Visit (HOSPITAL_COMMUNITY): Payer: Self-pay

## 2022-02-02 MED ORDER — METOPROLOL SUCCINATE ER 50 MG PO TB24
50.0000 mg | ORAL_TABLET | Freq: Every day | ORAL | 5 refills | Status: DC
  Filled 2022-02-02: qty 30, 30d supply, fill #0

## 2022-02-02 MED ORDER — DESVENLAFAXINE SUCCINATE ER 25 MG PO TB24
25.0000 mg | ORAL_TABLET | Freq: Every day | ORAL | 5 refills | Status: DC
  Filled 2022-02-02: qty 30, 30d supply, fill #0

## 2022-02-03 ENCOUNTER — Other Ambulatory Visit (HOSPITAL_COMMUNITY): Payer: Self-pay

## 2022-02-03 DIAGNOSIS — Z0000 Encounter for general adult medical examination without abnormal findings: Secondary | ICD-10-CM | POA: Diagnosis not present

## 2022-02-03 DIAGNOSIS — F411 Generalized anxiety disorder: Secondary | ICD-10-CM | POA: Diagnosis not present

## 2022-02-03 DIAGNOSIS — E1165 Type 2 diabetes mellitus with hyperglycemia: Secondary | ICD-10-CM | POA: Diagnosis not present

## 2022-02-03 DIAGNOSIS — E559 Vitamin D deficiency, unspecified: Secondary | ICD-10-CM | POA: Diagnosis not present

## 2022-02-03 DIAGNOSIS — R7989 Other specified abnormal findings of blood chemistry: Secondary | ICD-10-CM | POA: Diagnosis not present

## 2022-02-03 DIAGNOSIS — I10 Essential (primary) hypertension: Secondary | ICD-10-CM | POA: Diagnosis not present

## 2022-02-03 DIAGNOSIS — Z6841 Body Mass Index (BMI) 40.0 and over, adult: Secondary | ICD-10-CM | POA: Diagnosis not present

## 2022-02-03 DIAGNOSIS — E782 Mixed hyperlipidemia: Secondary | ICD-10-CM | POA: Diagnosis not present

## 2022-02-03 MED ORDER — CLONAZEPAM 0.5 MG PO TABS
0.5000 mg | ORAL_TABLET | Freq: Two times a day (BID) | ORAL | 0 refills | Status: DC
  Filled 2022-02-03: qty 60, 30d supply, fill #0

## 2022-02-03 MED ORDER — DESVENLAFAXINE SUCCINATE ER 25 MG PO TB24
25.0000 mg | ORAL_TABLET | Freq: Every day | ORAL | 2 refills | Status: DC
  Filled 2022-02-03 – 2022-02-23 (×2): qty 90, 90d supply, fill #0
  Filled 2022-05-17 – 2022-05-18 (×2): qty 90, 90d supply, fill #1
  Filled 2022-08-26: qty 90, 90d supply, fill #2

## 2022-02-03 MED ORDER — METOPROLOL SUCCINATE ER 50 MG PO TB24
50.0000 mg | ORAL_TABLET | Freq: Every day | ORAL | 5 refills | Status: DC
  Filled 2022-02-03: qty 30, 30d supply, fill #0
  Filled 2022-02-23: qty 90, 90d supply, fill #0
  Filled 2022-05-17 – 2022-05-18 (×2): qty 90, 90d supply, fill #1

## 2022-02-03 MED ORDER — SAXENDA 18 MG/3ML ~~LOC~~ SOPN
3.0000 mg | PEN_INJECTOR | Freq: Every day | SUBCUTANEOUS | 5 refills | Status: DC
  Filled 2022-02-03 – 2022-05-18 (×3): qty 15, 30d supply, fill #0
  Filled 2022-07-03: qty 15, 30d supply, fill #1

## 2022-02-04 ENCOUNTER — Other Ambulatory Visit (HOSPITAL_COMMUNITY): Payer: Self-pay

## 2022-02-09 ENCOUNTER — Encounter: Payer: Self-pay | Admitting: Cardiology

## 2022-02-09 DIAGNOSIS — R002 Palpitations: Secondary | ICD-10-CM | POA: Diagnosis not present

## 2022-02-11 ENCOUNTER — Encounter: Payer: Self-pay | Admitting: Cardiology

## 2022-02-22 ENCOUNTER — Other Ambulatory Visit (INDEPENDENT_AMBULATORY_CARE_PROVIDER_SITE_OTHER): Payer: Self-pay | Admitting: Internal Medicine

## 2022-02-22 ENCOUNTER — Encounter (INDEPENDENT_AMBULATORY_CARE_PROVIDER_SITE_OTHER): Payer: Self-pay | Admitting: Internal Medicine

## 2022-02-22 MED ORDER — HYDROCORTISONE ACETATE 25 MG RE SUPP
25.0000 mg | Freq: Two times a day (BID) | RECTAL | 0 refills | Status: DC
  Filled 2022-02-22: qty 28, 14d supply, fill #0

## 2022-02-22 MED ORDER — DILTIAZEM GEL 2 %
1.0000 "application " | Freq: Two times a day (BID) | CUTANEOUS | 2 refills | Status: DC
  Filled 2022-02-22 – 2022-02-23 (×2): qty 30, 15d supply, fill #0

## 2022-02-22 NOTE — Telephone Encounter (Signed)
Cynthia Ochoa is experiencing hematochezia since she had to strain because of constipation.  She is having anorectal discomfort as well. ?She may have developed hemorrhoids. ?We will treat her with Anusol HC suppository twice daily as needed for 2 weeks. ?If bleeding does not stop would consider flexible sigmoidoscopy. ?

## 2022-02-23 ENCOUNTER — Other Ambulatory Visit (HOSPITAL_COMMUNITY): Payer: Self-pay

## 2022-02-23 ENCOUNTER — Other Ambulatory Visit (HOSPITAL_COMMUNITY): Payer: 59

## 2022-03-16 ENCOUNTER — Encounter (INDEPENDENT_AMBULATORY_CARE_PROVIDER_SITE_OTHER): Payer: Self-pay | Admitting: *Deleted

## 2022-03-16 ENCOUNTER — Other Ambulatory Visit (INDEPENDENT_AMBULATORY_CARE_PROVIDER_SITE_OTHER): Payer: Self-pay

## 2022-03-16 ENCOUNTER — Telehealth (INDEPENDENT_AMBULATORY_CARE_PROVIDER_SITE_OTHER): Payer: Self-pay

## 2022-03-16 ENCOUNTER — Other Ambulatory Visit (HOSPITAL_COMMUNITY)
Admission: RE | Admit: 2022-03-16 | Discharge: 2022-03-16 | Disposition: A | Discharge status: Home / Self Care | Payer: 59 | Source: Ambulatory Visit | Attending: Internal Medicine | Admitting: Internal Medicine

## 2022-03-16 DIAGNOSIS — R109 Unspecified abdominal pain: Secondary | ICD-10-CM

## 2022-03-16 DIAGNOSIS — K625 Hemorrhage of anus and rectum: Secondary | ICD-10-CM

## 2022-03-16 DIAGNOSIS — Z01812 Encounter for preprocedural laboratory examination: Secondary | ICD-10-CM | POA: Insufficient documentation

## 2022-03-16 LAB — PREGNANCY, URINE: Preg Test, Ur: NEGATIVE

## 2022-03-16 NOTE — Telephone Encounter (Signed)
Cynthia Ochoa, CMA  ?

## 2022-03-19 ENCOUNTER — Ambulatory Visit (HOSPITAL_COMMUNITY): Payer: 59 | Admitting: Anesthesiology

## 2022-03-19 ENCOUNTER — Other Ambulatory Visit (HOSPITAL_COMMUNITY): Payer: Self-pay

## 2022-03-19 ENCOUNTER — Ambulatory Visit (HOSPITAL_COMMUNITY)
Admission: RE | Admit: 2022-03-19 | Discharge: 2022-03-19 | Disposition: A | Discharge status: Home / Self Care | Payer: 59 | Attending: Internal Medicine | Admitting: Internal Medicine

## 2022-03-19 ENCOUNTER — Encounter (HOSPITAL_COMMUNITY): Payer: Self-pay | Admitting: Internal Medicine

## 2022-03-19 ENCOUNTER — Ambulatory Visit (HOSPITAL_BASED_OUTPATIENT_CLINIC_OR_DEPARTMENT_OTHER): Payer: 59 | Admitting: Anesthesiology

## 2022-03-19 ENCOUNTER — Other Ambulatory Visit: Payer: Self-pay

## 2022-03-19 ENCOUNTER — Encounter (HOSPITAL_COMMUNITY)
Admission: RE | Disposition: A | Discharge status: Home / Self Care | Payer: Self-pay | Source: Home / Self Care | Attending: Internal Medicine

## 2022-03-19 DIAGNOSIS — K644 Residual hemorrhoidal skin tags: Secondary | ICD-10-CM | POA: Diagnosis not present

## 2022-03-19 DIAGNOSIS — K5731 Diverticulosis of large intestine without perforation or abscess with bleeding: Secondary | ICD-10-CM

## 2022-03-19 DIAGNOSIS — R109 Unspecified abdominal pain: Secondary | ICD-10-CM

## 2022-03-19 DIAGNOSIS — K5730 Diverticulosis of large intestine without perforation or abscess without bleeding: Secondary | ICD-10-CM | POA: Insufficient documentation

## 2022-03-19 DIAGNOSIS — I10 Essential (primary) hypertension: Secondary | ICD-10-CM | POA: Insufficient documentation

## 2022-03-19 DIAGNOSIS — K625 Hemorrhage of anus and rectum: Secondary | ICD-10-CM | POA: Diagnosis not present

## 2022-03-19 HISTORY — PX: COLONOSCOPY WITH PROPOFOL: SHX5780

## 2022-03-19 SURGERY — COLONOSCOPY WITH PROPOFOL
Anesthesia: General

## 2022-03-19 MED ORDER — PROPOFOL 500 MG/50ML IV EMUL
INTRAVENOUS | Status: DC | PRN
  Administered 2022-03-19: 150 ug/kg/min via INTRAVENOUS

## 2022-03-19 MED ORDER — PROPOFOL 10 MG/ML IV BOLUS
INTRAVENOUS | Status: DC | PRN
  Administered 2022-03-19: 100 mg via INTRAVENOUS

## 2022-03-19 MED ORDER — LACTATED RINGERS IV SOLN: INTRAVENOUS | Status: DC

## 2022-03-19 MED ORDER — STERILE WATER FOR IRRIGATION IR SOLN
Status: DC | PRN
  Administered 2022-03-19: 75 mL

## 2022-03-19 MED ORDER — LIDOCAINE HCL (CARDIAC) PF 100 MG/5ML IV SOSY
PREFILLED_SYRINGE | INTRAVENOUS | Status: DC | PRN
Start: 2022-03-19 — End: 2022-03-19
  Administered 2022-03-19: 50 mg via INTRAVENOUS

## 2022-03-19 MED ORDER — HYOSCYAMINE SULFATE 0.125 MG SL SUBL
0.1250 mg | SUBLINGUAL_TABLET | Freq: Three times a day (TID) | SUBLINGUAL | 0 refills | Status: DC | PRN
  Filled 2022-03-19 – 2022-03-23 (×2): qty 60, 20d supply, fill #0

## 2022-03-19 MED ORDER — HYDROCORTISONE ACETATE 25 MG RE SUPP
25.0000 mg | Freq: Two times a day (BID) | RECTAL | 0 refills | Status: DC
  Filled 2022-03-19 – 2022-03-23 (×2): qty 28, 14d supply, fill #0

## 2022-03-19 NOTE — Discharge Instructions (Signed)
Resume usual medications including fiber Gummies as before. ?Hyoscyamine sublingual 1 tablet up to 3 times a day.  Try taking it before your bowel movement and see if we can prevent pain following defecation. ?High-fiber diet. ?No driving for 24 hours. ?Next exam in 10 years. ?

## 2022-03-19 NOTE — Anesthesia Postprocedure Evaluation (Signed)
Anesthesia Post Note ? ?Patient: Cynthia Ochoa ? ?Procedure(s) Performed: COLONOSCOPY WITH PROPOFOL ? ?Patient location during evaluation: Endoscopy ?Anesthesia Type: General ?Level of consciousness: awake and alert and oriented ?Pain management: pain level controlled ?Vital Signs Assessment: post-procedure vital signs reviewed and stable ?Respiratory status: spontaneous breathing, nonlabored ventilation and respiratory function stable ?Cardiovascular status: blood pressure returned to baseline and stable ?Postop Assessment: no apparent nausea or vomiting ?Anesthetic complications: no ? ? ?No notable events documented. ? ? ?Last Vitals:  ?Vitals:  ? 03/19/22 1238 03/19/22 1432  ?BP: 116/77 114/71  ?Pulse: 73 80  ?Resp:  16  ?Temp: 36.7 ?C 36.6 ?C  ?SpO2: 97% 95%  ?  ?Last Pain:  ?Vitals:  ? 03/19/22 1432  ?TempSrc: Oral  ?PainSc: 0-No pain  ? ? ?  ?  ?  ?  ?  ?  ? ?Noble Bodie C [name] ? ? ? ? ?

## 2022-03-19 NOTE — Op Note (Signed)
[facility] ?Patient Name: Cynthia Ochoa ?Procedure Date: 03/19/2022 1:48 PM ?MRN: [id number] ?Date of Birth: 06/06/1976 ?Attending MD: Hildred Laser , MD ?CSN: [id number] ?Age: 46 ?Admit Type: Outpatient ?Procedure:                Colonoscopy ?Indications:              Rectal bleeding ?Providers:                Hildred Laser, MD, Charlsie Quest Theda Sers RN, RN, Kenney Houseman  ?                          Wilson ?Referring MD:             Pablo Lawrence, NP ?Medicines:                Propofol per Anesthesia ?Complications:            No immediate complications. ?Estimated Blood Loss:     Estimated blood loss: none. ?Procedure:                Pre-Anesthesia Assessment: ?                          - Prior to the procedure, a History and Physical  ?                          was performed, and patient medications and  ?                          allergies were reviewed. The patient's tolerance of  ?                          previous anesthesia was also reviewed. The risks  ?                          and benefits of the procedure and the sedation  ?                          options and risks were discussed with the patient.  ?                          All questions were answered, and informed consent  ?                          was obtained. Prior Anticoagulants: The patient has  ?                          taken no previous anticoagulant or antiplatelet  ?                          agents. ASA Grade Assessment: II - A patient with  ?                          mild systemic disease. After reviewing the risks  ?  and benefits, the patient was deemed in  ?                          satisfactory condition to undergo the procedure. ?                          After obtaining informed consent, the colonoscope  ?                          was passed under direct vision. Throughout the  ?                          procedure, the patient's blood pressure, pulse, and  ?                          oxygen saturations were  monitored continuously. The  ?                          [id number]-[id number] ([id number]) was introduced through the  ?                          anus and advanced to the the terminal ileum, with  ?                          identification of the appendiceal orifice and IC  ?                          valve. The colonoscopy was performed without  ?                          difficulty. The patient tolerated the procedure  ?                          well. The quality of the bowel preparation was good. ?Scope In: 2:08:36 PM ?Scope Out: 2:29:42 PM ?Scope Withdrawal Time: 0 hours 11 minutes 6 seconds  ?Total Procedure Duration: 0 hours 21 minutes 6 seconds  ?Findings: ?     Skin tags were found on perianal exam. ?     The terminal ileum appeared normal. ?     Scattered diverticula were found in the sigmoid colon, descending colon  ?     and transverse colon. ?     The exam was otherwise normal throughout the examined colon. ?     External hemorrhoids were found during retroflexion. The hemorrhoids  ?     were small. Samll scar above dentat line from previous banding. ?Impression:               - Perianal skin tags found on perianal exam. ?                          - The examined portion of the ileum was normal. ?                          - Diverticulosis in the sigmoid colon, in the  ?  descending colon and in the transverse colon. ?                          - External hemorrhoids. ?                          - SmallSmall scar proximal to dentate line  ?                          secondary to previous banding. ?                          - No specimens collected. ?Moderate Sedation: ?     Per Anesthesia Care ?Recommendation:           - Patient has a contact number available for  ?                          emergencies. The signs and symptoms of potential  ?                          delayed complications were discussed with the  ?                          patient. Return to normal activities tomorrow.  ?                           Written discharge instructions were provided to the  ?                          patient. ?                          - High fiber diet today. ?                          - Continue present medications. ?                          - Hyoscyamine sublingual 0.125 mg 3 times daily as  ?                          needed. ?                          - Repeat colonoscopy in 10 years for screening  ?                          purposes. ?Procedure Code(s):        --- Professional --- ?                          [phone], Colonoscopy, flexible; diagnostic, including  ?                          collection of specimen(s) by brushing or washing,  ?  when performed (separate procedure) ?Diagnosis Code(s):        --- Professional --- ?                          K64.4, Residual hemorrhoidal skin tags ?                          K62.5, Hemorrhage of anus and rectum ?                          K57.30, Diverticulosis of large intestine without  ?                          perforation or abscess without bleeding ?CPT copyright 2019 American Medical Association. All rights reserved. ?The codes documented in this report are preliminary and upon coder review may  ?be revised to meet current compliance requirements. ?Hildred Laser, MD ?Hildred Laser, MD ?03/19/2022 2:43:55 PM ?This report has been signed electronically. ?Number of Addenda: 0 ?

## 2022-03-19 NOTE — Anesthesia Preprocedure Evaluation (Addendum)
Anesthesia Evaluation  ?Patient identified by MRN, date of birth, ID band ?Patient awake ? ? ? ?Reviewed: ?Allergy & Precautions, NPO status , Patient's Chart, lab work & pertinent test results, reviewed documented beta blocker date and time  ? ?Airway ?Mallampati: II ? ?TM Distance: >3 FB ?Neck ROM: Full ? ? ? Dental ? ?(+) Dental Advisory Given, Teeth Intact ?  ?Pulmonary ?neg pulmonary ROS,  ?  ?Pulmonary exam normal ?breath sounds clear to auscultation ? ? ? ? ? ? Cardiovascular ?Exercise Tolerance: Good ?hypertension, Pt. on medications and Pt. on home beta blockers ?Normal cardiovascular exam ?Rhythm:Regular Rate:Normal ? ? ?  ?Neuro/Psych ?PSYCHIATRIC DISORDERS Anxiety negative neurological ROS ?   ? GI/Hepatic ?negative GI ROS, Neg liver ROS, Bowel prep,  ?Endo/Other  ?negative endocrine ROS ? Renal/GU ?negative Renal ROS  ?negative genitourinary ?  ?Musculoskeletal ?negative musculoskeletal ROS ?(+)  ? Abdominal ?  ?Peds ?negative pediatric ROS ?(+)  Hematology ?negative hematology ROS ?(+)   ?Anesthesia Other Findings ? ? Reproductive/Obstetrics ?negative OB ROS ? ?  ? ? ? ? ? ? ? ? ? ? ? ? ? ?  ?  ? ? ? ? ? ? ? ?Anesthesia Physical ?Anesthesia Plan ? ?ASA: 2 ? ?Anesthesia Plan: General  ? ?Post-op Pain Management: Minimal or no pain anticipated  ? ?Induction: Intravenous ? ?PONV Risk Score and Plan: Propofol infusion ? ?Airway Management Planned: Nasal Cannula and Natural Airway ? ?Additional Equipment:  ? ?Intra-op Plan:  ? ?Post-operative Plan:  ? ?Informed Consent: I have reviewed the patients History and Physical, chart, labs and discussed the procedure including the risks, benefits and alternatives for the proposed anesthesia with the patient or authorized representative who has indicated his/her understanding and acceptance.  ? ? ? ?Dental advisory given ? ?Plan Discussed with: CRNA and Surgeon ? ?Anesthesia Plan Comments:   ? ? ? ? ? ? ?Anesthesia Quick  Evaluation ? ?

## 2022-03-19 NOTE — Transfer of Care (Signed)
Immediate Anesthesia Transfer of Care Note ? ?Patient: Cynthia Ochoa ? ?Procedure(s) Performed: COLONOSCOPY WITH PROPOFOL ? ?Patient Location: Endoscopy Unit ? ?Anesthesia Type:General ? ?Level of Consciousness: drowsy ? ?Airway & Oxygen Therapy: Patient Spontanous Breathing ? ?Post-op Assessment: Report given to RN and Post -op Vital signs reviewed and stable ? ?Post vital signs: Reviewed and stable ? ?Last Vitals:  ?Vitals Value Taken Time  ?BP 114/71   ?Temp    ?Pulse 80   ?Resp 23   ?SpO2 95%   ? ? ?Last Pain:  ?Vitals:  ? 03/19/22 1407  ?TempSrc:   ?PainSc: 0-No pain  ?   ? ?Patients Stated Pain Goal: 6 (03/19/22 1238) ? ?Complications: No notable events documented. ?

## 2022-03-19 NOTE — H&P (Signed)
Cynthia Ochoa is an 46 y.o. female.   ?Chief Complaint: Patient is here for colonoscopy ?HPI: Patient is 46 year old Caucasian female who went on a keto diet over a month ago.  She developed severe constipation and in February 21, 2022 she had painful defecation and bleeding.  She has been treated for hemorrhoids as well as fissure and anal.  She still has anorectal pain after defecation lasting 5 minutes and she is still passing blood per rectum.  She has a history of colonic diverticulosis and diverticulitis.  Last episode was in July 2021.  She has been maintained on mesalamine(off label use) ?Family history is negative for colon cancer.  Family history is positive for Crohn disease in her mother. ? ?Past Medical History:  ?Diagnosis Date  ? Abnormal Pap smear   ? Diverticulitis   ? Essential hypertension   ? Fibroid   ? Hx of campylobacteriosis 2005  ? Hx of Salmonella gastroenteritis   ? IBS (irritable bowel syndrome)   ? ? ?Past Surgical History:  ?Procedure Laterality Date  ? CERVICAL CONIZATION W/BX N/A 09/07/2013  ? Procedure: CONIZATION CERVIX WITH BIOPSY/REMOVAL OF IUD AND INSERTION;  Surgeon: Cyril Mourning, MD;  Location: [facility];  Service: Gynecology;  Laterality: N/A;  ? COLONOSCOPY  2005 NUR  ? I/E HEMORRHOIDS, TIC AT HF, NL TI  ? COLONOSCOPY N/A 12/27/2015  ? Procedure: COLONOSCOPY;  Surgeon: Danie Binder, MD;  Location: [facility];  Service: Endoscopy;  Laterality: N/A;  0830  ? COLPOSCOPY    ? CYSTECTOMY    ? abdomen  ? GYNECOLOGIC CRYOSURGERY    ? LAPAROSCOPIC APPENDECTOMY N/A 09/14/2016  ? Procedure: APPENDECTOMY LAPAROSCOPIC;  Surgeon: Aviva Signs, MD;  Location: [facility];  Service: General;  Laterality: N/A;  ? VAGINAL DELIVERY  04/07/13  ? WISDOM TOOTH EXTRACTION    ? ? ?Family History  ?Problem Relation Age of Onset  ? Hypertension Mother   ? Hypertension Father   ? Melanoma Paternal Uncle   ? Prader-Willi syndrome Brother   ? Diabetes Brother   ? Hypertension Brother   ? COPD Maternal  Grandfather   ? COPD Paternal Grandmother   ? ?Social History:  reports that she has never smoked. She has never used smokeless tobacco. She reports that she does not drink alcohol and does not use drugs. ? ?Allergies:  ?Allergies  ?Allergen Reactions  ? Hyzaar [Losartan Potassium-Hctz] Shortness Of Breath  ? Ciprofloxacin Other (See Comments)  ?  Nerve pain. - IV only  ? Morphine And Related Other (See Comments)  ?  CHEST PRESSURE  ? Adhesive [Tape] Rash  ? ? ?Medications Prior to Admission  ?Medication Sig Dispense Refill  ? ciprofloxacin (CIPRO) 500 MG tablet Take 1 tablet (500 mg total) by mouth 2 (two) times daily. 20 tablet 0  ? clindamycin (CLEOCIN T) 1 % external solution APPLY A THIN LAYER TO THE AFFECTED AREA(S) BY TOPICAL ROUTE 2 TIMES PER DAY 60 mL 3  ? clindamycin (CLEOCIN T) 1 % lotion APPLY TO THE AFFECTED AREA(S) TWO TIMES DAILY 60 mL 3  ? clonazePAM (KLONOPIN) 0.5 MG tablet Take 1 tablet (0.5 mg total) by mouth 2 (two) times daily. (Patient taking differently: Take 0.5 mg by mouth 2 (two) times daily as needed for anxiety.) 60 tablet 0  ? desvenlafaxine (PRISTIQ) 25 MG 24 hr tablet Take 1 tablet (25 mg total) by mouth daily. 30 tablet 5  ? desvenlafaxine (PRISTIQ) 25 MG 24 hr tablet Take 1 tablet (  25 mg total) by mouth daily. (Patient taking differently: Take 25 mg by mouth every evening.) 90 tablet 2  ? diltiazem 2 % GEL Apply 1 application topically 2 (two) times daily. 30 g 2  ? doxycycline (VIBRAMYCIN) 100 MG capsule Take 1 capsule (100 mg total) by mouth daily. 30 capsule 3  ? FIBER SELECT GUMMIES PO Take 4 tablets by mouth daily.    ? hydrocortisone (ANUSOL-HC) 25 MG suppository Place 1 suppository (25 mg total) rectally 2 (two) times daily. 28 suppository 0  ? Liraglutide -Weight Management (SAXENDA) 18 MG/3ML SOPN Inject 3 mg into the skin daily. 3 mL 5  ? Liraglutide -Weight Management (SAXENDA) 18 MG/3ML SOPN Inject 3 mg (0.5 mLs) into the skin daily. 15 mL 5  ? Liraglutide -Weight  Management (SAXENDA) 18 MG/3ML SOPN Inject 3 mg into the skin daily. (Patient taking differently: Inject 3 mg into the skin every evening.) 15 mL 5  ? losartan (COZAAR) 25 MG tablet Take 1 tablet (25 mg total) by mouth daily. 30 tablet 5  ? losartan-hydrochlorothiazide (HYZAAR) 50-12.5 MG tablet Take one tablet by mouth daily. 30 tablet 5  ? mesalamine (APRISO) 0.375 g 24 hr capsule Take 4 capsules (1.5 g total) by mouth daily. 120 capsule 11  ? metoprolol succinate (TOPROL-XL) 50 MG 24 hr tablet Take 1 tablet (50 mg total) by mouth daily. (Patient taking differently: Take 50 mg by mouth every evening.) 30 tablet 5  ? metoprolol succinate (TOPROL-XL) 50 MG 24 hr tablet Take 1 tablet (50 mg total) by mouth daily. 30 tablet 5  ? metroNIDAZOLE (FLAGYL) 500 MG tablet Take 1 tablet (500 mg total) by mouth 2 (two) times daily. 20 tablet 0  ? ? ?No results found for this or any previous visit (from the past 48 hour(s)). ?No results found. ? ?Review of Systems ? ?Blood pressure 116/77, pulse 73, temperature 98.1 ?F (36.7 ?C), temperature source Oral, height '5\' 7"'$  (1.702 m), weight 113.4 kg, SpO2 97 %. ?Physical Exam ?HENT:  ?   Mouth/Throat:  ?   Mouth: Mucous membranes are moist.  ?   Pharynx: Oropharynx is clear.  ?Eyes:  ?   General: No scleral icterus. ?   Conjunctiva/sclera: Conjunctivae normal.  ?Cardiovascular:  ?   Rate and Rhythm: Normal rate and regular rhythm.  ?   Heart sounds: Normal heart sounds. No murmur heard. ?Pulmonary:  ?   Effort: Pulmonary effort is normal.  ?   Breath sounds: Normal breath sounds.  ?Abdominal:  ?   General: There is no distension.  ?   Palpations: Abdomen is soft. There is no mass.  ?   Tenderness: There is no abdominal tenderness.  ?Musculoskeletal:     ?   General: No swelling.  ?   Cervical back: Neck supple.  ?Lymphadenopathy:  ?   Cervical: No cervical adenopathy.  ?Skin: ?   General: Skin is warm and dry.  ?Neurological:  ?   Mental Status: She is alert.  ?   ? ?Assessment/Plan ? ?Rectal bleeding\ ?Diagnostic colonoscopy. ? ?Hildred Laser, MD ?03/19/2022, 2:02 PM ? ? ? ?

## 2022-03-22 ENCOUNTER — Other Ambulatory Visit (HOSPITAL_COMMUNITY): Payer: Self-pay

## 2022-03-23 ENCOUNTER — Other Ambulatory Visit (HOSPITAL_COMMUNITY): Payer: Self-pay

## 2022-03-23 MED ORDER — SAXENDA 18 MG/3ML ~~LOC~~ SOPN
3.0000 mg | PEN_INJECTOR | Freq: Every day | SUBCUTANEOUS | 5 refills | Status: DC
  Filled 2022-03-23: qty 15, 30d supply, fill #0
  Filled 2022-03-23: qty 3, 90d supply, fill #0

## 2022-03-24 ENCOUNTER — Other Ambulatory Visit (HOSPITAL_COMMUNITY): Payer: Self-pay

## 2022-03-24 ENCOUNTER — Encounter (HOSPITAL_COMMUNITY): Payer: Self-pay | Admitting: Internal Medicine

## 2022-04-06 ENCOUNTER — Other Ambulatory Visit (HOSPITAL_COMMUNITY): Payer: Self-pay

## 2022-04-06 MED ORDER — CLONAZEPAM 0.5 MG PO TABS
0.5000 mg | ORAL_TABLET | Freq: Two times a day (BID) | ORAL | 0 refills | Status: DC
Start: 2022-04-06 — End: 2022-08-26
  Filled 2022-04-06 – 2022-07-07 (×7): qty 60, 30d supply, fill #0

## 2022-04-14 ENCOUNTER — Other Ambulatory Visit (HOSPITAL_COMMUNITY): Payer: Self-pay

## 2022-04-21 ENCOUNTER — Encounter (INDEPENDENT_AMBULATORY_CARE_PROVIDER_SITE_OTHER): Payer: Self-pay | Admitting: Internal Medicine

## 2022-04-21 ENCOUNTER — Ambulatory Visit (INDEPENDENT_AMBULATORY_CARE_PROVIDER_SITE_OTHER): Payer: 59 | Admitting: Internal Medicine

## 2022-04-21 ENCOUNTER — Other Ambulatory Visit (HOSPITAL_COMMUNITY): Payer: Self-pay

## 2022-04-21 DIAGNOSIS — R238 Other skin changes: Secondary | ICD-10-CM | POA: Insufficient documentation

## 2022-04-21 DIAGNOSIS — K602 Anal fissure, unspecified: Secondary | ICD-10-CM | POA: Diagnosis not present

## 2022-04-21 MED ORDER — OXYCODONE HCL 5 MG PO TABS: 5.0000 mg | ORAL_TABLET | ORAL | 0 refills | Status: DC | PRN

## 2022-04-21 MED ORDER — LEVOFLOXACIN 500 MG PO TABS
500.0000 mg | ORAL_TABLET | Freq: Every day | ORAL | 0 refills | Status: DC
  Filled 2022-04-21: qty 10, 10d supply, fill #0

## 2022-04-21 MED ORDER — LEVOFLOXACIN 500 MG PO TABS
500.0000 mg | ORAL_TABLET | Freq: Every day | ORAL | 0 refills | Status: DC
Start: 2022-04-21 — End: 2022-04-21

## 2022-04-21 MED ORDER — LEVOFLOXACIN 500 MG PO TABS: 500.0000 mg | ORAL_TABLET | Freq: Every day | ORAL | 0 refills | Status: DC

## 2022-04-21 NOTE — Patient Instructions (Signed)
Physician will call with results of blood work when completed. Follow-up with dermatologist as soon as possible.

## 2022-04-21 NOTE — Progress Notes (Signed)
Presenting complaint;  History of recurrent diverticulitis, history of anal fissure. Patient complains of painful skin lesion over left patella.  Database and subjective:  Patient is 46 year old Caucasian female who is here for scheduled visit.  She was last seen 1 year ago.  She has a history of 4 episodes of diverticulitis with 2 hospitalizations in 1 ER visit.  She has been on oral mesalamine since August 2021(off label use) and has not had any episode of diverticulitis.  She has a prescription on hand for Cipro and metronidazole just in case she has another episode in which case she can start therapy as soon as possible. Last when she was evaluated for rectal bleeding and painful defecation.  She was diagnosed with anal fissure.  She was treated with 3 weeks of diltiazem gel with resolution of her symptoms.  She underwent colonoscopy on 03/19/2022 revealing sigmoid diverticulosis external hemorrhoids distal rectal scarring from prior hemorrhoidal banding and anal tags.  Patient states she is doing well from GI standpoint.  She has not had painful defecation or rectal bleeding anymore.  Her bowels move daily and are every other day.  She takes 10 g of fiber in in the form of Gummies.  She is not having any side effects.  She has new non-GI complaint.  She was unable to see her PCP Ms. Pablo Lawrence, NP.  She developed papular rash over left patella about 3 weeks ago.  She noted burning and itching.  She did not have any fever chills or widespread rash.  This lesion gradually resolved.  She noted another skin lesion close to the prior site 5 days ago.  She has noted increase in the size of the skin lesion and it is very painful.  She has doxycycline that she uses for acne.  She she decided to started 3 days ago.  She cannot tell any difference.  No history of rash elsewhere.  She denies fever or chills.  She says pain has been really bad that she was unable to sleep last night. She denies history of  insect bite.  She did go on a cruise last month.  She did swim in an ocean.  She returned from the cruise on 03/19/2021.  She does not remember any bites or cuts while she was swimming.  She denies aphthous ulcers arthralgias or myalgias. She has been on oral mesalamine since August 2021 and has not experienced any side effects.  Her mother was diagnosed with Crohn disease few years ago while in her 66s.  Family history is negative for lupus or other autoimmune diseases.   Current Medications: Outpatient Encounter Medications as of 04/21/2022  Medication Sig   ciprofloxacin (CIPRO) 500 MG tablet Take 1 tablet (500 mg total) by mouth 2 (two) times daily.   clindamycin (CLEOCIN T) 1 % external solution APPLY A THIN LAYER TO THE AFFECTED AREA(S) BY TOPICAL ROUTE 2 TIMES PER DAY   clindamycin (CLEOCIN T) 1 % lotion APPLY TO THE AFFECTED AREA(S) TWO TIMES DAILY   clonazePAM (KLONOPIN) 0.5 MG tablet Take 1 tablet (0.5 mg total) by mouth 2 (two) times daily.   desvenlafaxine (PRISTIQ) 25 MG 24 hr tablet Take 1 tablet (25 mg total) by mouth daily. (Patient taking differently: Take 25 mg by mouth every evening.)   diltiazem 2 % GEL Apply 1 application topically 2 (two) times daily.   doxycycline (VIBRAMYCIN) 100 MG capsule Take 1 capsule (100 mg total) by mouth daily.   Jewell  Take 4 tablets by mouth daily.   hydrocortisone (ANUSOL-HC) 25 MG suppository Place 1 suppository (25 mg total) rectally 2 (two) times daily.   hyoscyamine (LEVSIN SL) 0.125 MG SL tablet Place 1 tablet (0.125 mg total) under the tongue 3 (three) times daily as needed.   Liraglutide -Weight Management (SAXENDA) 18 MG/3ML SOPN Inject 3 mg into the skin daily. (Patient taking differently: Inject 3 mg into the skin every evening.)   mesalamine (APRISO) 0.375 g 24 hr capsule Take 4 capsules (1.5 g total) by mouth daily.   metoprolol succinate (TOPROL-XL) 50 MG 24 hr tablet Take 1 tablet (50 mg total) by mouth daily.    metroNIDAZOLE (FLAGYL) 500 MG tablet Take 1 tablet (500 mg total) by mouth 2 (two) times daily.   [DISCONTINUED] desvenlafaxine (PRISTIQ) 25 MG 24 hr tablet Take 1 tablet (25 mg total) by mouth daily.   [DISCONTINUED] Liraglutide -Weight Management (SAXENDA) 18 MG/3ML SOPN Inject 3 mg into the skin daily.   [DISCONTINUED] metoprolol succinate (TOPROL-XL) 50 MG 24 hr tablet Take 1 tablet (50 mg total) by mouth daily. (Patient taking differently: Take 50 mg by mouth every evening.)   [DISCONTINUED] Liraglutide -Weight Management (SAXENDA) 18 MG/3ML SOPN Inject 3 mg into the skin daily.   [DISCONTINUED] losartan (COZAAR) 25 MG tablet Take 1 tablet (25 mg total) by mouth daily.   [DISCONTINUED] losartan-hydrochlorothiazide (HYZAAR) 50-12.5 MG tablet Take one tablet by mouth daily. (Patient not taking: Reported on 04/21/2022)   No facility-administered encounter medications on file as of 04/21/2022.     Objective: Blood pressure 113/82, pulse 87, temperature 98 F (36.7 C), temperature source Oral, height '5\' 7"'$  (1.702 m), weight 265 lb (120.2 kg). Patient is alert and in no acute distress. Conjunctiva is pink. Sclera is nonicteric Oropharyngeal mucosa is normal. No neck masses or thyromegaly noted. Abdomen is full but soft and nontender with organomegaly or masses. No LE edema or clubbing noted.  She has about 15 x 15 mm very tender warm skin lesion over lower part of left patella.  It has fine nodularity and punctum but there is no drainage.  Below into the right of it is the site where she had earlier skin lesion marked by pale center and pale peripheral rim.  There is faint discoloration to skin around this lesion along with some edema. Knee is not swollen. She does not have rash or any other part of her body.  Labs/studies Results:  Lab data from 02/03/2022  WBC 8.3, H&H 15.4 and 45.1 and platelet count 277K Serum sodium 138, potassium 4.1, chloride 101, CO2 22, BUN 13 and creatinine 0.76  and glucose 74. Serum calcium 9.4 Bilirubin 0.3, AP 61, AST 16, ALT 18, total protein 6.8 and albumin 4.5.   Assessment:  #1.  History of recurrent diverticulitis.  She has had 4 episodes between November 2016 and July 2021.  She required hospitalization for 2 of these episodes.  She was begun on oral mesalamine(off label use) in August 2021 and she has not had another episode.  Hopefully it is making a difference.  She will continue oral mesalamine as long as she does not have another episode.  #2.  Recent evaluation for rectal bleeding secondary to anal fissure.  She responded to topical diltiazem gel.  She just needs to make sure she she does not have constipation.  #3.  Painful skin lesion at left patella.  This lesion is extremely tender warm and with subtle nodularity.  It does not appear  to be an abscess.  She had a similar but smaller lesion 3 weeks earlier.  She has been on doxycycline for 3 days and it did not make any difference.  She was on a cruise last month and did swim in ocean but no history of skin injury or bites.  Differential diagnosis includes an infection or an autoimmune etiology.  This lesion reminds me of erythema nodosum but she does not have inflammatory bowel disease.  This could also be drug related.  Mesalamine occasionally can cause skin lesions like this. Will empirically treat her with antibiotic and pain medication until she can see a dermatologist.  Plan:  Discontinue doxycycline. Patient will go to the lab for CBC with differential, sed rate and comprehensive chemistry panel.  I will contact patient with results of this blood work. Levaquin 500 mg by mouth daily for 10 days.  First dose today. Oxycodone 5 mg p.o. every 4 as needed.  Prescription given for 30 doses without refill. She will contact Dr. Wilhemina Bonito, her dermatologist office tomorrow and hopefully they can work her in. Office visit in 1 year.

## 2022-04-22 DIAGNOSIS — L0889 Other specified local infections of the skin and subcutaneous tissue: Secondary | ICD-10-CM | POA: Diagnosis not present

## 2022-04-22 DIAGNOSIS — L02416 Cutaneous abscess of left lower limb: Secondary | ICD-10-CM | POA: Diagnosis not present

## 2022-04-23 ENCOUNTER — Other Ambulatory Visit (INDEPENDENT_AMBULATORY_CARE_PROVIDER_SITE_OTHER): Payer: Self-pay | Admitting: Internal Medicine

## 2022-04-23 ENCOUNTER — Other Ambulatory Visit (HOSPITAL_COMMUNITY): Payer: Self-pay

## 2022-04-23 ENCOUNTER — Other Ambulatory Visit (HOSPITAL_COMMUNITY)
Admission: RE | Admit: 2022-04-23 | Discharge: 2022-04-23 | Disposition: A | Discharge status: Home / Self Care | Payer: 59 | Source: Ambulatory Visit | Attending: Internal Medicine | Admitting: Internal Medicine

## 2022-04-23 DIAGNOSIS — R238 Other skin changes: Secondary | ICD-10-CM | POA: Insufficient documentation

## 2022-04-23 LAB — CBC WITH DIFFERENTIAL/PLATELET
Abs Immature Granulocytes: 0.02 10*3/uL (ref 0.00–0.07)
Basophils Absolute: 0.1 10*3/uL (ref 0.0–0.1)
Basophils Relative: 1 %
Eosinophils Absolute: 0.3 10*3/uL (ref 0.0–0.5)
Eosinophils Relative: 4 %
HCT: 43.4 % (ref 36.0–46.0)
Hemoglobin: 14.8 g/dL (ref 12.0–15.0)
Immature Granulocytes: 0 %
Lymphocytes Relative: 31 %
Lymphs Abs: 2.7 10*3/uL (ref 0.7–4.0)
MCH: 29.7 pg (ref 26.0–34.0)
MCHC: 34.1 g/dL (ref 30.0–36.0)
MCV: 87.1 fL (ref 80.0–100.0)
Monocytes Absolute: 0.7 10*3/uL (ref 0.1–1.0)
Monocytes Relative: 9 %
Neutro Abs: 4.7 10*3/uL (ref 1.7–7.7)
Neutrophils Relative %: 55 %
Platelets: 238 10*3/uL (ref 150–400)
RBC: 4.98 MIL/uL (ref 3.87–5.11)
RDW: 13.2 % (ref 11.5–15.5)
WBC: 8.5 10*3/uL (ref 4.0–10.5)
nRBC: 0 % (ref 0.0–0.2)

## 2022-04-23 LAB — COMPREHENSIVE METABOLIC PANEL
ALT: 18 U/L (ref 0–44)
AST: 16 U/L (ref 15–41)
Albumin: 3.7 g/dL (ref 3.5–5.0)
Alkaline Phosphatase: 55 U/L (ref 38–126)
Anion gap: 4 — ABNORMAL LOW (ref 5–15)
BUN: 13 mg/dL (ref 6–20)
CO2: 27 mmol/L (ref 22–32)
Calcium: 9.2 mg/dL (ref 8.9–10.3)
Chloride: 107 mmol/L (ref 98–111)
Creatinine, Ser: 0.89 mg/dL (ref 0.44–1.00)
GFR, Estimated: >60 mL/min (ref >60)
Glucose, Bld: 95 mg/dL (ref 70–99)
Potassium: 4.1 mmol/L (ref 3.5–5.1)
Sodium: 138 mmol/L (ref 135–145)
Total Bilirubin: 0.4 mg/dL (ref 0.3–1.2)
Total Protein: 6.6 g/dL (ref 6.5–8.1)

## 2022-04-23 LAB — SEDIMENTATION RATE: Sed Rate: 0 mm/hr (ref 0–22)

## 2022-04-23 MED ORDER — MESALAMINE ER 0.375 G PO CP24
1.5000 g | ORAL_CAPSULE | Freq: Every day | ORAL | 0 refills | Status: DC
  Filled 2022-04-23: qty 120, 30d supply, fill #0

## 2022-04-23 NOTE — Telephone Encounter (Signed)
Seen 04/21/22

## 2022-04-23 NOTE — Telephone Encounter (Signed)
Last seen 04/21/2022.

## 2022-04-24 ENCOUNTER — Other Ambulatory Visit (HOSPITAL_COMMUNITY): Payer: Self-pay

## 2022-05-18 ENCOUNTER — Other Ambulatory Visit (HOSPITAL_COMMUNITY): Payer: Self-pay

## 2022-05-19 ENCOUNTER — Other Ambulatory Visit (HOSPITAL_COMMUNITY): Payer: Self-pay

## 2022-05-21 ENCOUNTER — Other Ambulatory Visit (HOSPITAL_COMMUNITY): Payer: Self-pay

## 2022-06-05 ENCOUNTER — Ambulatory Visit (HOSPITAL_COMMUNITY)
Admission: RE | Admit: 2022-06-05 | Discharge: 2022-06-05 | Disposition: A | Discharge status: Home / Self Care | Payer: 59 | Source: Ambulatory Visit | Attending: Adult Health Nurse Practitioner | Admitting: Adult Health Nurse Practitioner

## 2022-06-05 ENCOUNTER — Other Ambulatory Visit (HOSPITAL_COMMUNITY): Payer: Self-pay | Admitting: Adult Health Nurse Practitioner

## 2022-06-05 DIAGNOSIS — M533 Sacrococcygeal disorders, not elsewhere classified: Secondary | ICD-10-CM | POA: Insufficient documentation

## 2022-06-10 DIAGNOSIS — S3210XA Unspecified fracture of sacrum, initial encounter for closed fracture: Secondary | ICD-10-CM | POA: Diagnosis not present

## 2022-06-10 DIAGNOSIS — S322XXA Fracture of coccyx, initial encounter for closed fracture: Secondary | ICD-10-CM | POA: Diagnosis not present

## 2022-06-17 ENCOUNTER — Other Ambulatory Visit (HOSPITAL_COMMUNITY): Payer: Self-pay

## 2022-07-02 ENCOUNTER — Other Ambulatory Visit (HOSPITAL_COMMUNITY): Payer: Self-pay

## 2022-07-02 DIAGNOSIS — Z124 Encounter for screening for malignant neoplasm of cervix: Secondary | ICD-10-CM | POA: Diagnosis not present

## 2022-07-02 DIAGNOSIS — Z01419 Encounter for gynecological examination (general) (routine) without abnormal findings: Secondary | ICD-10-CM | POA: Diagnosis not present

## 2022-07-02 MED ORDER — SPIRONOLACTONE 50 MG PO TABS
ORAL_TABLET | ORAL | 11 refills | Status: DC
  Filled 2022-07-02: qty 30, 30d supply, fill #0
  Filled 2022-07-31: qty 30, 30d supply, fill #1
  Filled 2022-08-26: qty 30, 30d supply, fill #2
  Filled 2022-09-09 – 2022-09-25 (×2): qty 30, 30d supply, fill #3
  Filled 2022-10-21: qty 30, 30d supply, fill #4
  Filled 2022-11-07 – 2022-11-18 (×2): qty 30, 30d supply, fill #5
  Filled 2022-12-19: qty 30, 30d supply, fill #6
  Filled 2023-01-11: qty 30, 30d supply, fill #7
  Filled 2023-02-02: qty 30, 30d supply, fill #8
  Filled 2023-03-06: qty 30, 30d supply, fill #9
  Filled 2023-04-02: qty 30, 30d supply, fill #10
  Filled 2023-04-05: qty 60, 60d supply, fill #10
  Filled 2023-04-09: qty 30, 30d supply, fill #10
  Filled 2023-05-04 – 2023-05-08 (×2): qty 30, 30d supply, fill #11

## 2022-07-03 ENCOUNTER — Other Ambulatory Visit (HOSPITAL_COMMUNITY): Payer: Self-pay

## 2022-07-06 ENCOUNTER — Other Ambulatory Visit (HOSPITAL_COMMUNITY): Payer: Self-pay

## 2022-07-07 ENCOUNTER — Other Ambulatory Visit (HOSPITAL_COMMUNITY): Payer: Self-pay

## 2022-07-08 ENCOUNTER — Other Ambulatory Visit (HOSPITAL_COMMUNITY): Payer: Self-pay

## 2022-07-08 MED ORDER — NAPROXEN 500 MG PO TABS
500.0000 mg | ORAL_TABLET | Freq: Two times a day (BID) | ORAL | 2 refills | Status: DC
Start: 2022-06-10 — End: 2023-06-28
  Filled 2022-07-08 (×2): qty 60, 30d supply, fill #0
  Filled 2023-01-11: qty 60, 30d supply, fill #1

## 2022-07-08 MED ORDER — OMEPRAZOLE 40 MG PO CPDR
40.0000 mg | DELAYED_RELEASE_CAPSULE | Freq: Every day | ORAL | 3 refills | Status: DC
Start: 2022-06-10 — End: 2023-06-28
  Filled 2022-07-08 (×2): qty 30, 30d supply, fill #0

## 2022-07-09 ENCOUNTER — Other Ambulatory Visit (HOSPITAL_COMMUNITY): Payer: Self-pay

## 2022-07-20 DIAGNOSIS — L814 Other melanin hyperpigmentation: Secondary | ICD-10-CM | POA: Diagnosis not present

## 2022-07-20 DIAGNOSIS — D2271 Melanocytic nevi of right lower limb, including hip: Secondary | ICD-10-CM | POA: Diagnosis not present

## 2022-07-20 DIAGNOSIS — D225 Melanocytic nevi of trunk: Secondary | ICD-10-CM | POA: Diagnosis not present

## 2022-07-20 DIAGNOSIS — L821 Other seborrheic keratosis: Secondary | ICD-10-CM | POA: Diagnosis not present

## 2022-07-20 DIAGNOSIS — L820 Inflamed seborrheic keratosis: Secondary | ICD-10-CM | POA: Diagnosis not present

## 2022-08-01 ENCOUNTER — Other Ambulatory Visit (HOSPITAL_COMMUNITY): Payer: Self-pay

## 2022-08-03 ENCOUNTER — Other Ambulatory Visit (HOSPITAL_COMMUNITY): Payer: Self-pay

## 2022-08-09 ENCOUNTER — Telehealth: Payer: 59 | Admitting: Nurse Practitioner

## 2022-08-09 DIAGNOSIS — B9689 Other specified bacterial agents as the cause of diseases classified elsewhere: Secondary | ICD-10-CM | POA: Diagnosis not present

## 2022-08-09 DIAGNOSIS — J019 Acute sinusitis, unspecified: Secondary | ICD-10-CM

## 2022-08-09 MED ORDER — AMOXICILLIN-POT CLAVULANATE 875-125 MG PO TABS: 1.0000 | ORAL_TABLET | Freq: Two times a day (BID) | ORAL | 0 refills | Status: AC

## 2022-08-09 NOTE — Progress Notes (Signed)
I have spent 5 minutes in review of e-visit questionnaire, review and updating patient chart, medical decision making and response to patient.  ° °Uniqua Kihn W Tanyia Grabbe, NP ° °  °

## 2022-08-09 NOTE — Progress Notes (Signed)

## 2022-08-26 ENCOUNTER — Other Ambulatory Visit (HOSPITAL_COMMUNITY): Payer: Self-pay

## 2022-08-26 MED ORDER — METOPROLOL SUCCINATE ER 50 MG PO TB24
50.0000 mg | ORAL_TABLET | Freq: Every day | ORAL | 5 refills | Status: DC
  Filled 2022-08-26: qty 30, 30d supply, fill #0
  Filled 2022-09-25: qty 30, 30d supply, fill #1
  Filled 2022-10-21: qty 30, 30d supply, fill #2
  Filled 2022-11-07 – 2022-11-18 (×2): qty 30, 30d supply, fill #3
  Filled 2022-12-19: qty 30, 30d supply, fill #4
  Filled 2023-01-11: qty 30, 30d supply, fill #5

## 2022-08-26 MED ORDER — DOXYCYCLINE HYCLATE 100 MG PO CAPS
100.0000 mg | ORAL_CAPSULE | Freq: Every day | ORAL | 3 refills | Status: DC
  Filled 2022-08-26: qty 30, 30d supply, fill #0
  Filled 2022-09-25: qty 30, 30d supply, fill #1
  Filled 2022-11-18: qty 30, 30d supply, fill #2
  Filled 2022-12-19: qty 30, 30d supply, fill #3

## 2022-08-26 MED ORDER — CLONAZEPAM 0.5 MG PO TABS
ORAL_TABLET | ORAL | 0 refills | Status: DC
  Filled 2022-08-26: qty 60, 30d supply, fill #0

## 2022-09-02 ENCOUNTER — Other Ambulatory Visit (HOSPITAL_COMMUNITY): Payer: Self-pay | Admitting: Obstetrics and Gynecology

## 2022-09-02 DIAGNOSIS — Z1231 Encounter for screening mammogram for malignant neoplasm of breast: Secondary | ICD-10-CM

## 2022-09-07 ENCOUNTER — Ambulatory Visit (HOSPITAL_COMMUNITY)
Admission: RE | Admit: 2022-09-07 | Discharge: 2022-09-07 | Disposition: A | Discharge status: Home / Self Care | Payer: 59 | Source: Ambulatory Visit | Attending: Obstetrics and Gynecology | Admitting: Obstetrics and Gynecology

## 2022-09-07 DIAGNOSIS — Z1231 Encounter for screening mammogram for malignant neoplasm of breast: Secondary | ICD-10-CM | POA: Insufficient documentation

## 2022-09-09 ENCOUNTER — Other Ambulatory Visit (HOSPITAL_COMMUNITY): Payer: Self-pay

## 2022-09-25 ENCOUNTER — Other Ambulatory Visit (HOSPITAL_COMMUNITY): Payer: Self-pay

## 2022-10-07 ENCOUNTER — Other Ambulatory Visit (HOSPITAL_COMMUNITY): Payer: Self-pay

## 2022-10-21 ENCOUNTER — Other Ambulatory Visit (INDEPENDENT_AMBULATORY_CARE_PROVIDER_SITE_OTHER): Payer: Self-pay | Admitting: Internal Medicine

## 2022-10-21 ENCOUNTER — Other Ambulatory Visit (HOSPITAL_COMMUNITY): Payer: Self-pay

## 2022-10-21 MED ORDER — MESALAMINE ER 0.375 G PO CP24
1.5000 g | ORAL_CAPSULE | Freq: Every day | ORAL | 0 refills | Status: DC
Start: 2022-10-21 — End: 2022-11-07
  Filled 2022-10-21 – 2022-10-26 (×2): qty 120, 30d supply, fill #0

## 2022-10-22 ENCOUNTER — Other Ambulatory Visit (HOSPITAL_COMMUNITY): Payer: Self-pay

## 2022-10-23 ENCOUNTER — Other Ambulatory Visit (HOSPITAL_COMMUNITY): Payer: Self-pay

## 2022-10-23 MED ORDER — CLONAZEPAM 0.5 MG PO TABS
0.5000 mg | ORAL_TABLET | Freq: Two times a day (BID) | ORAL | 0 refills | Status: DC
  Filled 2022-10-23: qty 60, 30d supply, fill #0

## 2022-10-26 ENCOUNTER — Other Ambulatory Visit (HOSPITAL_COMMUNITY): Payer: Self-pay

## 2022-11-07 ENCOUNTER — Other Ambulatory Visit (HOSPITAL_COMMUNITY): Payer: Self-pay

## 2022-11-07 ENCOUNTER — Other Ambulatory Visit (INDEPENDENT_AMBULATORY_CARE_PROVIDER_SITE_OTHER): Payer: Self-pay | Admitting: Gastroenterology

## 2022-11-09 ENCOUNTER — Other Ambulatory Visit (HOSPITAL_COMMUNITY): Payer: Self-pay

## 2022-11-09 ENCOUNTER — Other Ambulatory Visit: Payer: Self-pay

## 2022-11-09 MED ORDER — DESVENLAFAXINE SUCCINATE ER 25 MG PO TB24
25.0000 mg | ORAL_TABLET | Freq: Every day | ORAL | 2 refills | Status: DC
  Filled 2022-11-09: qty 90, 90d supply, fill #0
  Filled 2023-02-02: qty 90, 90d supply, fill #1

## 2022-11-09 MED ORDER — MESALAMINE ER 0.375 G PO CP24
1.5000 g | ORAL_CAPSULE | Freq: Every day | ORAL | 3 refills | Status: DC
  Filled 2022-11-09 – 2022-12-01 (×6): qty 120, 30d supply, fill #0
  Filled 2023-01-11 – 2023-01-12 (×2): qty 120, 30d supply, fill #1
  Filled 2023-02-02: qty 120, 30d supply, fill #2
  Filled 2023-03-06: qty 120, 30d supply, fill #3

## 2022-11-18 ENCOUNTER — Other Ambulatory Visit: Payer: Self-pay

## 2022-11-18 ENCOUNTER — Other Ambulatory Visit (HOSPITAL_COMMUNITY): Payer: Self-pay

## 2022-11-20 ENCOUNTER — Other Ambulatory Visit: Payer: Self-pay

## 2022-11-20 ENCOUNTER — Other Ambulatory Visit (HOSPITAL_COMMUNITY): Payer: Self-pay

## 2022-11-24 ENCOUNTER — Other Ambulatory Visit: Payer: Self-pay

## 2022-11-24 ENCOUNTER — Other Ambulatory Visit (HOSPITAL_COMMUNITY): Payer: Self-pay

## 2022-12-01 ENCOUNTER — Other Ambulatory Visit (HOSPITAL_COMMUNITY): Payer: Self-pay

## 2022-12-19 ENCOUNTER — Other Ambulatory Visit (HOSPITAL_COMMUNITY): Payer: Self-pay

## 2022-12-21 ENCOUNTER — Other Ambulatory Visit: Payer: Self-pay

## 2022-12-21 ENCOUNTER — Other Ambulatory Visit (HOSPITAL_COMMUNITY): Payer: Self-pay

## 2022-12-21 MED ORDER — CLONAZEPAM 0.5 MG PO TABS
0.5000 mg | ORAL_TABLET | Freq: Two times a day (BID) | ORAL | 0 refills | Status: DC
  Filled 2022-12-21: qty 60, 30d supply, fill #0

## 2023-01-12 ENCOUNTER — Other Ambulatory Visit: Payer: Self-pay

## 2023-02-02 ENCOUNTER — Other Ambulatory Visit (HOSPITAL_COMMUNITY): Payer: Self-pay

## 2023-02-03 ENCOUNTER — Other Ambulatory Visit (HOSPITAL_COMMUNITY): Payer: Self-pay

## 2023-02-03 ENCOUNTER — Other Ambulatory Visit: Payer: Self-pay

## 2023-02-03 MED ORDER — DOXYCYCLINE HYCLATE 100 MG PO CAPS
100.0000 mg | ORAL_CAPSULE | Freq: Every day | ORAL | 3 refills | Status: DC
  Filled 2023-02-03: qty 30, 30d supply, fill #0
  Filled 2023-02-26: qty 30, 30d supply, fill #1
  Filled 2023-04-02: qty 30, 30d supply, fill #2
  Filled 2023-04-26: qty 30, 30d supply, fill #3

## 2023-02-04 ENCOUNTER — Other Ambulatory Visit: Payer: Self-pay

## 2023-02-08 ENCOUNTER — Other Ambulatory Visit (HOSPITAL_COMMUNITY): Payer: Self-pay

## 2023-02-08 MED ORDER — MOUNJARO 2.5 MG/0.5ML ~~LOC~~ SOAJ
2.5000 mg | SUBCUTANEOUS | 0 refills | Status: DC
  Filled 2023-02-08: qty 2, 28d supply, fill #0

## 2023-02-08 MED ORDER — DESVENLAFAXINE SUCCINATE ER 50 MG PO TB24
50.0000 mg | ORAL_TABLET | Freq: Every day | ORAL | 3 refills | Status: DC
  Filled 2023-02-08: qty 30, 30d supply, fill #0
  Filled 2023-03-06: qty 30, 30d supply, fill #1
  Filled 2023-04-15: qty 30, 30d supply, fill #2

## 2023-02-09 ENCOUNTER — Other Ambulatory Visit: Payer: Self-pay

## 2023-02-09 ENCOUNTER — Other Ambulatory Visit (HOSPITAL_COMMUNITY): Payer: Self-pay

## 2023-02-09 MED ORDER — CLONAZEPAM 0.5 MG PO TABS
0.5000 mg | ORAL_TABLET | Freq: Two times a day (BID) | ORAL | 0 refills | Status: DC
  Filled 2023-02-09: qty 60, 30d supply, fill #0

## 2023-02-11 ENCOUNTER — Encounter (INDEPENDENT_AMBULATORY_CARE_PROVIDER_SITE_OTHER): Payer: Self-pay | Admitting: *Deleted

## 2023-02-13 ENCOUNTER — Other Ambulatory Visit (HOSPITAL_COMMUNITY): Payer: Self-pay

## 2023-02-15 ENCOUNTER — Other Ambulatory Visit: Payer: Self-pay

## 2023-02-15 ENCOUNTER — Other Ambulatory Visit (HOSPITAL_COMMUNITY): Payer: Self-pay

## 2023-02-15 MED ORDER — METOPROLOL SUCCINATE ER 50 MG PO TB24
50.0000 mg | ORAL_TABLET | Freq: Every day | ORAL | 5 refills | Status: DC
  Filled 2023-02-15: qty 30, 30d supply, fill #0
  Filled 2023-03-06 – 2023-03-16 (×3): qty 30, 30d supply, fill #1
  Filled 2023-04-02 – 2023-04-07 (×2): qty 30, 30d supply, fill #2
  Filled 2023-05-04: qty 30, 30d supply, fill #3
  Filled 2023-06-01: qty 30, 30d supply, fill #4
  Filled 2023-06-26: qty 30, 30d supply, fill #5

## 2023-02-16 ENCOUNTER — Telehealth: Payer: Commercial Managed Care - PPO | Admitting: Physician Assistant

## 2023-02-16 DIAGNOSIS — J208 Acute bronchitis due to other specified organisms: Secondary | ICD-10-CM | POA: Diagnosis not present

## 2023-02-16 DIAGNOSIS — B9689 Other specified bacterial agents as the cause of diseases classified elsewhere: Secondary | ICD-10-CM

## 2023-02-16 MED ORDER — AZITHROMYCIN 250 MG PO TABS: ORAL_TABLET | ORAL | 0 refills | Status: AC

## 2023-02-16 MED ORDER — BENZONATATE 100 MG PO CAPS: 100.0000 mg | ORAL_CAPSULE | Freq: Three times a day (TID) | ORAL | 0 refills | Status: DC | PRN

## 2023-02-16 NOTE — Progress Notes (Signed)
E-Visit for Cough  We are sorry that you are not feeling well.  Here is how we plan to help!  Based on your presentation I believe you most likely have A cough due to bacteria.  When patients have a fever and a productive cough with a change in color or increased sputum production, we are concerned about bacterial bronchitis.  If left untreated it can progress to pneumonia.  If your symptoms do not improve with your treatment plan it is important that you contact your provider.   I have prescribed Azithromyin 250 mg: two tablets now and then one tablet daily for 4 additonal days. Stop your daily Doxycycline (acne) while on this medication.    In addition you may use A prescription cough medication called Tessalon Perles 100mg . You may take 1-2 capsules every 8 hours as needed for your cough.  From your responses in the eVisit questionnaire you describe inflammation in the upper respiratory tract which is causing a significant cough.  This is commonly called Bronchitis and has four common causes:   Allergies Viral Infections Acid Reflux Bacterial Infection Allergies, viruses and acid reflux are treated by controlling symptoms or eliminating the cause. An example might be a cough caused by taking certain blood pressure medications. You stop the cough by changing the medication. Another example might be a cough caused by acid reflux. Controlling the reflux helps control the cough.  USE OF BRONCHODILATOR ("RESCUE") INHALERS: There is a risk from using your bronchodilator too frequently.  The risk is that over-reliance on a medication which only relaxes the muscles surrounding the breathing tubes can reduce the effectiveness of medications prescribed to reduce swelling and congestion of the tubes themselves.  Although you feel brief relief from the bronchodilator inhaler, your asthma may actually be worsening with the tubes becoming more swollen and filled with mucus.  This can delay other crucial  treatments, such as oral steroid medications. If you need to use a bronchodilator inhaler daily, several times per day, you should discuss this with your provider.  There are probably better treatments that could be used to keep your asthma under control.     HOME CARE Only take medications as instructed by your medical team. Complete the entire course of an antibiotic. Drink plenty of fluids and get plenty of rest. Avoid close contacts especially the very young and the elderly Cover your mouth if you cough or cough into your sleeve. Always remember to wash your hands A steam or ultrasonic humidifier can help congestion.   GET HELP RIGHT AWAY IF: You develop worsening fever. You become short of breath You cough up blood. Your symptoms persist after you have completed your treatment plan MAKE SURE YOU  Understand these instructions. Will watch your condition. Will get help right away if you are not doing well or get worse.    Thank you for choosing an e-visit.  Your e-visit answers were reviewed by a board certified advanced clinical practitioner to complete your personal care plan. Depending upon the condition, your plan could have included both over the counter or prescription medications.  Please review your pharmacy choice. Make sure the pharmacy is open so you can pick up prescription now. If there is a problem, you may contact your provider through [employer] and have the prescription routed to another pharmacy.  Your safety is important to [place]. If you have drug allergies check your prescription carefully.   For the next 24 hours you can use MyChart to  ask questions about today's visit, request a non-urgent call back, or ask for a work or school excuse. You will get an email in the next two days asking about your experience. I hope that your e-visit has been valuable and will speed your recovery.

## 2023-02-16 NOTE — Progress Notes (Signed)
I have spent 5 minutes in review of e-visit questionnaire, review and updating patient chart, medical decision making and response to patient.   Mahreen Schewe Cody Baillie Mohammad, PA-C    

## 2023-02-26 ENCOUNTER — Other Ambulatory Visit (HOSPITAL_COMMUNITY): Payer: Self-pay

## 2023-02-27 ENCOUNTER — Other Ambulatory Visit (HOSPITAL_COMMUNITY): Payer: Self-pay

## 2023-03-01 ENCOUNTER — Other Ambulatory Visit (HOSPITAL_COMMUNITY): Payer: Self-pay

## 2023-03-01 MED ORDER — MOUNJARO 2.5 MG/0.5ML ~~LOC~~ SOAJ
2.5000 mg | SUBCUTANEOUS | 0 refills | Status: DC
  Filled 2023-03-01: qty 2, 28d supply, fill #0

## 2023-03-01 MED ORDER — MOUNJARO 5 MG/0.5ML ~~LOC~~ SOAJ
5.0000 mg | SUBCUTANEOUS | 0 refills | Status: DC
  Filled 2023-03-01: qty 2, 28d supply, fill #0

## 2023-03-01 MED ORDER — MOUNJARO 5 MG/0.5ML ~~LOC~~ SOAJ
5.0000 mg | SUBCUTANEOUS | 0 refills | Status: DC
  Filled 2023-03-01 – 2023-04-08 (×4): qty 2, 28d supply, fill #0

## 2023-03-02 ENCOUNTER — Other Ambulatory Visit (HOSPITAL_COMMUNITY): Payer: Self-pay

## 2023-03-06 ENCOUNTER — Other Ambulatory Visit (HOSPITAL_COMMUNITY): Payer: Self-pay

## 2023-03-08 ENCOUNTER — Other Ambulatory Visit: Payer: Self-pay

## 2023-03-08 ENCOUNTER — Other Ambulatory Visit (HOSPITAL_COMMUNITY): Payer: Self-pay

## 2023-03-08 MED ORDER — CLONAZEPAM 0.5 MG PO TABS
0.5000 mg | ORAL_TABLET | Freq: Two times a day (BID) | ORAL | 0 refills | Status: DC
  Filled 2023-03-08: qty 60, 30d supply, fill #0

## 2023-03-10 ENCOUNTER — Other Ambulatory Visit: Payer: Self-pay

## 2023-03-10 ENCOUNTER — Other Ambulatory Visit (HOSPITAL_COMMUNITY): Payer: Self-pay

## 2023-03-10 MED ORDER — BUSPIRONE HCL 10 MG PO TABS
10.0000 mg | ORAL_TABLET | Freq: Two times a day (BID) | ORAL | 2 refills | Status: DC
  Filled 2023-03-10 (×2): qty 60, 30d supply, fill #0
  Filled 2023-04-02 – 2023-04-04 (×2): qty 60, 30d supply, fill #1
  Filled 2023-05-04: qty 60, 30d supply, fill #2

## 2023-03-16 ENCOUNTER — Other Ambulatory Visit: Payer: Self-pay

## 2023-03-24 ENCOUNTER — Other Ambulatory Visit (HOSPITAL_COMMUNITY): Payer: Self-pay

## 2023-03-24 ENCOUNTER — Other Ambulatory Visit: Payer: Self-pay

## 2023-03-24 MED ORDER — DESVENLAFAXINE SUCCINATE ER 100 MG PO TB24
100.0000 mg | ORAL_TABLET | Freq: Every day | ORAL | 5 refills | Status: DC
  Filled 2023-03-24 (×2): qty 30, 30d supply, fill #0
  Filled 2023-04-02 – 2023-04-18 (×3): qty 30, 30d supply, fill #1
  Filled 2023-05-04 – 2023-05-18 (×3): qty 30, 30d supply, fill #2
  Filled 2023-06-01 – 2023-06-24 (×2): qty 30, 30d supply, fill #3
  Filled 2023-07-22: qty 30, 30d supply, fill #4
  Filled 2023-08-11 – 2023-08-17 (×2): qty 30, 30d supply, fill #5

## 2023-03-24 MED ORDER — MOUNJARO 7.5 MG/0.5ML ~~LOC~~ SOAJ
7.5000 mg | SUBCUTANEOUS | 5 refills | Status: DC
  Filled 2023-03-24 – 2023-04-26 (×6): qty 2, 28d supply, fill #0

## 2023-04-01 ENCOUNTER — Other Ambulatory Visit (HOSPITAL_COMMUNITY): Payer: Self-pay

## 2023-04-01 ENCOUNTER — Other Ambulatory Visit: Payer: Self-pay

## 2023-04-02 ENCOUNTER — Other Ambulatory Visit (HOSPITAL_BASED_OUTPATIENT_CLINIC_OR_DEPARTMENT_OTHER): Payer: Self-pay

## 2023-04-02 ENCOUNTER — Other Ambulatory Visit (HOSPITAL_COMMUNITY): Payer: Self-pay

## 2023-04-02 ENCOUNTER — Other Ambulatory Visit: Payer: Self-pay

## 2023-04-02 ENCOUNTER — Other Ambulatory Visit (INDEPENDENT_AMBULATORY_CARE_PROVIDER_SITE_OTHER): Payer: Self-pay | Admitting: Gastroenterology

## 2023-04-02 MED ORDER — CLONAZEPAM 0.5 MG PO TABS
0.5000 mg | ORAL_TABLET | Freq: Two times a day (BID) | ORAL | 0 refills | Status: DC
  Filled 2023-04-02 – 2023-04-04 (×2): qty 60, 30d supply, fill #0

## 2023-04-02 MED ORDER — MESALAMINE ER 0.375 G PO CP24
1.5000 g | ORAL_CAPSULE | Freq: Every day | ORAL | 3 refills | Status: DC
  Filled 2023-04-02: qty 120, 30d supply, fill #0
  Filled 2023-05-04: qty 120, 30d supply, fill #1
  Filled 2023-06-01: qty 120, 30d supply, fill #2
  Filled 2023-06-24: qty 120, 30d supply, fill #3

## 2023-04-02 NOTE — Telephone Encounter (Signed)
Last seen 04/21/22 by dr Karilyn Cota. Has an appt 06/28/23 with you

## 2023-04-05 ENCOUNTER — Other Ambulatory Visit: Payer: Self-pay

## 2023-04-06 ENCOUNTER — Other Ambulatory Visit (HOSPITAL_COMMUNITY): Payer: Self-pay

## 2023-04-08 ENCOUNTER — Other Ambulatory Visit (HOSPITAL_COMMUNITY): Payer: Self-pay

## 2023-04-09 ENCOUNTER — Other Ambulatory Visit (HOSPITAL_COMMUNITY): Payer: Self-pay

## 2023-04-12 ENCOUNTER — Other Ambulatory Visit: Payer: Self-pay

## 2023-04-13 ENCOUNTER — Other Ambulatory Visit: Payer: Self-pay

## 2023-04-13 ENCOUNTER — Other Ambulatory Visit (HOSPITAL_COMMUNITY): Payer: Self-pay

## 2023-04-14 ENCOUNTER — Other Ambulatory Visit: Payer: Self-pay

## 2023-04-16 ENCOUNTER — Other Ambulatory Visit: Payer: Self-pay

## 2023-04-16 ENCOUNTER — Other Ambulatory Visit (HOSPITAL_COMMUNITY): Payer: Self-pay

## 2023-04-16 DIAGNOSIS — Z30433 Encounter for removal and reinsertion of intrauterine contraceptive device: Secondary | ICD-10-CM | POA: Diagnosis not present

## 2023-04-19 ENCOUNTER — Other Ambulatory Visit: Payer: Self-pay

## 2023-04-20 ENCOUNTER — Other Ambulatory Visit: Payer: Self-pay

## 2023-04-22 ENCOUNTER — Ambulatory Visit (INDEPENDENT_AMBULATORY_CARE_PROVIDER_SITE_OTHER): Payer: 59 | Admitting: Gastroenterology

## 2023-04-23 ENCOUNTER — Other Ambulatory Visit: Payer: Self-pay

## 2023-04-26 ENCOUNTER — Other Ambulatory Visit: Payer: Self-pay

## 2023-04-26 ENCOUNTER — Other Ambulatory Visit (HOSPITAL_COMMUNITY): Payer: Self-pay

## 2023-04-27 ENCOUNTER — Other Ambulatory Visit (HOSPITAL_COMMUNITY): Payer: Self-pay

## 2023-04-27 MED ORDER — MOUNJARO 10 MG/0.5ML ~~LOC~~ SOAJ
10.0000 mg | SUBCUTANEOUS | 3 refills | Status: DC
  Filled 2023-04-27 – 2023-05-26 (×5): qty 2, 28d supply, fill #0
  Filled 2023-06-24: qty 2, 28d supply, fill #1
  Filled 2023-07-22: qty 2, 28d supply, fill #2

## 2023-05-01 ENCOUNTER — Other Ambulatory Visit (HOSPITAL_COMMUNITY): Payer: Self-pay

## 2023-05-03 ENCOUNTER — Other Ambulatory Visit (HOSPITAL_COMMUNITY): Payer: Self-pay

## 2023-05-04 ENCOUNTER — Other Ambulatory Visit (HOSPITAL_COMMUNITY): Payer: Self-pay

## 2023-05-04 ENCOUNTER — Other Ambulatory Visit: Payer: Self-pay

## 2023-05-05 ENCOUNTER — Other Ambulatory Visit: Payer: Self-pay

## 2023-05-05 ENCOUNTER — Other Ambulatory Visit (HOSPITAL_COMMUNITY): Payer: Self-pay

## 2023-05-05 MED ORDER — CLONAZEPAM 0.5 MG PO TABS
0.5000 mg | ORAL_TABLET | Freq: Two times a day (BID) | ORAL | 0 refills | Status: DC
  Filled 2023-05-05 – 2023-05-11 (×3): qty 60, 30d supply, fill #0

## 2023-05-06 ENCOUNTER — Other Ambulatory Visit: Payer: Self-pay

## 2023-05-08 ENCOUNTER — Other Ambulatory Visit (HOSPITAL_COMMUNITY): Payer: Self-pay

## 2023-05-10 ENCOUNTER — Other Ambulatory Visit (HOSPITAL_COMMUNITY): Payer: Self-pay

## 2023-05-11 ENCOUNTER — Other Ambulatory Visit (HOSPITAL_COMMUNITY): Payer: Self-pay

## 2023-05-12 ENCOUNTER — Other Ambulatory Visit: Payer: Self-pay

## 2023-05-13 ENCOUNTER — Other Ambulatory Visit: Payer: Self-pay

## 2023-05-14 ENCOUNTER — Other Ambulatory Visit: Payer: Self-pay

## 2023-05-14 ENCOUNTER — Other Ambulatory Visit (HOSPITAL_COMMUNITY): Payer: Self-pay

## 2023-05-18 ENCOUNTER — Other Ambulatory Visit: Payer: Self-pay

## 2023-05-19 ENCOUNTER — Other Ambulatory Visit: Payer: Self-pay

## 2023-05-19 ENCOUNTER — Other Ambulatory Visit (HOSPITAL_COMMUNITY): Payer: Self-pay

## 2023-05-21 ENCOUNTER — Other Ambulatory Visit: Payer: Self-pay

## 2023-05-24 ENCOUNTER — Other Ambulatory Visit (HOSPITAL_COMMUNITY): Payer: Self-pay

## 2023-05-26 ENCOUNTER — Other Ambulatory Visit (HOSPITAL_COMMUNITY): Payer: Self-pay

## 2023-05-26 ENCOUNTER — Other Ambulatory Visit: Payer: Self-pay

## 2023-05-26 MED ORDER — BUSPIRONE HCL 10 MG PO TABS
10.0000 mg | ORAL_TABLET | Freq: Two times a day (BID) | ORAL | 0 refills | Status: DC
  Filled 2023-05-26 – 2023-06-01 (×2): qty 60, 30d supply, fill #0

## 2023-05-31 DIAGNOSIS — Z30431 Encounter for routine checking of intrauterine contraceptive device: Secondary | ICD-10-CM | POA: Diagnosis not present

## 2023-06-01 ENCOUNTER — Other Ambulatory Visit (HOSPITAL_COMMUNITY): Payer: Self-pay

## 2023-06-01 ENCOUNTER — Other Ambulatory Visit: Payer: Self-pay

## 2023-06-01 MED ORDER — DOXYCYCLINE HYCLATE 100 MG PO CAPS
100.0000 mg | ORAL_CAPSULE | Freq: Every day | ORAL | 1 refills | Status: DC
  Filled 2023-06-01: qty 30, 30d supply, fill #0
  Filled 2023-06-28: qty 30, 30d supply, fill #1

## 2023-06-01 MED ORDER — SPIRONOLACTONE 50 MG PO TABS
50.0000 mg | ORAL_TABLET | Freq: Every day | ORAL | 1 refills | Status: DC
  Filled 2023-06-01: qty 30, 30d supply, fill #0
  Filled 2023-06-26: qty 30, 30d supply, fill #1

## 2023-06-25 ENCOUNTER — Other Ambulatory Visit: Payer: Self-pay

## 2023-06-25 ENCOUNTER — Other Ambulatory Visit (HOSPITAL_COMMUNITY): Payer: Self-pay

## 2023-06-26 ENCOUNTER — Other Ambulatory Visit (HOSPITAL_COMMUNITY): Payer: Self-pay

## 2023-06-28 ENCOUNTER — Other Ambulatory Visit (HOSPITAL_COMMUNITY): Payer: Self-pay

## 2023-06-28 ENCOUNTER — Other Ambulatory Visit: Payer: Self-pay

## 2023-06-28 ENCOUNTER — Encounter (INDEPENDENT_AMBULATORY_CARE_PROVIDER_SITE_OTHER): Payer: Self-pay | Admitting: Gastroenterology

## 2023-06-28 ENCOUNTER — Ambulatory Visit (INDEPENDENT_AMBULATORY_CARE_PROVIDER_SITE_OTHER): Payer: Commercial Managed Care - PPO | Admitting: Gastroenterology

## 2023-06-28 VITALS — BP 99/74 | HR 86 | Temp 97.6°F | Ht 67.0 in | Wt 239.7 lb

## 2023-06-28 DIAGNOSIS — K5792 Diverticulitis of intestine, part unspecified, without perforation or abscess without bleeding: Secondary | ICD-10-CM

## 2023-06-28 MED ORDER — BUSPIRONE HCL 10 MG PO TABS
10.0000 mg | ORAL_TABLET | Freq: Two times a day (BID) | ORAL | 0 refills | Status: DC
  Filled 2023-06-28: qty 60, 30d supply, fill #0

## 2023-06-28 NOTE — Patient Instructions (Signed)
Continue Apriso 1.5 g qday. Avoid NSAIDs or high dose aspirin

## 2023-06-28 NOTE — Progress Notes (Signed)
Cynthia Ochoa, M.D. Gastroenterology & Hepatology [facility] Gastroenterology [address] [place], [place] [address]  Primary Care Physician: Roe Rutherford, NP [address] [place] [address]  I will communicate my assessment and recommendations to the referring MD via EMR.  Problems: Recurrent diverticulitis.  History of Present Illness: Cynthia Ochoa is a 47 y.o. female with past medical history of diverticulitis, hypertension, IBS, who presents for follow up of recurrent diverticulitis.  The patient was last seen on 04/21/2022 (Dr. Karilyn Cota). At that time, the patient was continued on mesalamine for recurrent diverticulitis.  She was also given Levaquin for a nodule found in her patella.  Last episode of diverticulitis was 5 years ago. She does not take nay NSAIDs.  Patient reports feeling well and denies having any complaints.The patient denies having any nausea, vomiting, fever, chills, hematochezia, melena, hematemesis, abdominal distention, abdominal pain, diarrhea, jaundice, pruritus or weight loss.  She is currently Apriso 1.5 g qday.   Last labs were performed on 02/08/2023.  CBC showed a WBC 8.3, hemoglobin 14.9, platelets 285, CMP was normal with creatinine 0.78, BUN 8, sodium 140, potassium 4.2, total bilirubin 0.4, alkaline phosphatase 70, AST 16, ALT 22.  Last EGD: Last Colonoscopy: 03/19/22 - Perianal skin tags found on perianal exam. - The examined portion of the ileum was normal. - Diverticulosis in the sigmoid colon, in the descending colon and in the transverse colon. - External hemorrhoids. - SmallSmall scar proximal to dentate line secondary to previous banding.  Recommended repeat colonoscopy in 10 years  Past Medical History: Past Medical History:  Diagnosis Date   Abnormal Pap smear    Diverticulitis    Essential hypertension    Fibroid    Hx of campylobacteriosis 2005   Hx of Salmonella gastroenteritis     IBS (irritable bowel syndrome)     Past Surgical History: Past Surgical History:  Procedure Laterality Date   CERVICAL CONIZATION W/BX N/A 09/07/2013   Procedure: CONIZATION CERVIX WITH BIOPSY/REMOVAL OF IUD AND INSERTION;  Surgeon: Jeani Hawking, MD;  Location: [facility];  Service: Gynecology;  Laterality: N/A;   COLONOSCOPY  2005 NUR   I/E HEMORRHOIDS, TIC AT HF, NL TI   COLONOSCOPY N/A 12/27/2015   Procedure: COLONOSCOPY;  Surgeon: West Bali, MD;  Location: [facility];  Service: Endoscopy;  Laterality: N/A;  0830   COLONOSCOPY WITH PROPOFOL N/A 03/19/2022   Procedure: COLONOSCOPY WITH PROPOFOL;  Surgeon: Malissa Hippo, MD;  Location: [facility];  Service: Endoscopy;  Laterality: N/A;  145   COLPOSCOPY     CYSTECTOMY     abdomen   GYNECOLOGIC CRYOSURGERY     LAPAROSCOPIC APPENDECTOMY N/A 09/14/2016   Procedure: APPENDECTOMY LAPAROSCOPIC;  Surgeon: Franky Macho, MD;  Location: [facility];  Service: General;  Laterality: N/A;   VAGINAL DELIVERY  04/07/13   WISDOM TOOTH EXTRACTION      Family History: Family History  Problem Relation Age of Onset   Hypertension Mother    Hypertension Father    Melanoma Paternal Uncle    Prader-Willi syndrome Brother    Diabetes Brother    Hypertension Brother    COPD Maternal Grandfather    COPD Paternal Grandmother     Social History: Social History   Tobacco Use  Smoking Status Never   Passive exposure: Never  Smokeless Tobacco Never   Social History   Substance and Sexual Activity  Alcohol Use No   Social  History   Substance and Sexual Activity  Drug Use No    Allergies: Allergies  Allergen Reactions   Hyzaar [Losartan Potassium-Hctz] Shortness Of Breath   Ciprofloxacin Other (See Comments)    Nerve pain. - IV only   Morphine And Codeine Other (See Comments)    CHEST PRESSURE   Adhesive [Tape] Rash    Medications: Current Outpatient Medications  Medication Sig Dispense Refill   busPIRone (BUSPAR) 10 MG  tablet Take 1 tablet (10 mg total) by mouth 2 (two) times daily. 60 tablet 0   clindamycin (CLEOCIN T) 1 % external solution APPLY A THIN LAYER TO THE AFFECTED AREA(S) BY TOPICAL ROUTE 2 TIMES PER DAY 60 mL 3   clindamycin (CLEOCIN T) 1 % lotion APPLY TO THE AFFECTED AREA(S) TWO TIMES DAILY 60 mL 3   clonazePAM (KLONOPIN) 0.5 MG tablet Take 1 tablet (0.5 mg total) by mouth 2 (two) times daily. 60 tablet 0   desvenlafaxine (PRISTIQ) 100 MG 24 hr tablet Take 1 tablet (100 mg total) by mouth daily. 30 tablet 5   doxycycline (MORGIDOX) 100 MG capsule Take 1 capsule (100 mg total) by mouth daily. 30 capsule 1   FIBER SELECT GUMMIES PO Take 4 tablets by mouth daily.     mesalamine (APRISO) 0.375 g 24 hr capsule Take 4 capsules (1.5 g total) by mouth daily. 120 capsule 3   metoprolol succinate (TOPROL-XL) 50 MG 24 hr tablet Take 1 tablet (50 mg total) by mouth daily. 30 tablet 5   spironolactone (ALDACTONE) 50 MG tablet Take 1 tablet (50 mg total) by mouth daily. 30 tablet 1   tirzepatide (MOUNJARO) 10 MG/0.5ML Pen Inject 10 mg into the skin once a week. 2 mL 3   No current facility-administered medications for this visit.    Review of Systems: GENERAL: negative for malaise, night sweats HEENT: No changes in hearing or vision, no nose bleeds or other nasal problems. NECK: Negative for lumps, goiter, pain and significant neck swelling RESPIRATORY: Negative for cough, wheezing CARDIOVASCULAR: Negative for chest pain, leg swelling, palpitations, orthopnea GI: SEE HPI MUSCULOSKELETAL: Negative for joint pain or swelling, back pain, and muscle pain. SKIN: Negative for lesions, rash PSYCH: Negative for sleep disturbance, mood disorder and recent psychosocial stressors. HEMATOLOGY Negative for prolonged bleeding, bruising easily, and swollen nodes. ENDOCRINE: Negative for cold or heat intolerance, polyuria, polydipsia and goiter. NEURO: negative for tremor, gait imbalance, syncope and seizures. The  remainder of the review of systems is noncontributory.   Physical Exam: BP 99/74 (BP Location: Left Arm, Patient Position: Sitting, Cuff Size: Large)   Pulse 86   Temp 97.6 F (36.4 C) (Oral)   Ht 5\' 7"  (1.702 m)   Wt 239 lb 11.2 oz (108.7 kg)   BMI 37.54 kg/m  GENERAL: The patient is AO x3, in no acute distress. HEENT: Head is normocephalic and atraumatic. EOMI are intact. Mouth is well hydrated and without lesions. NECK: Supple. No masses LUNGS: Clear to auscultation. No presence of rhonchi/wheezing/rales. Adequate chest expansion HEART: RRR, normal s1 and s2. ABDOMEN: Soft, nontender, no guarding, no peritoneal signs, and nondistended. BS +. No masses. EXTREMITIES: Without any cyanosis, clubbing, rash, lesions or edema. NEUROLOGIC: AOx3, no focal motor deficit. SKIN: no jaundice, no rashes  Imaging/Labs: as above  I personally reviewed and interpreted the available labs, imaging and endoscopic files.  Impression and Plan: PERCIE MOUNTFORD is a 47 y.o. female with past medical history of diverticulitis, hypertension, IBS, who presents for follow up  of recurrent diverticulitis.  Patient has been doing well and has been asymptomatic since she has been taking low-dose mesalamine.  Most recent blood workup was unremarkable.  Will continue her on same dose of mesalamine.  She should abstain from using any NSAIDs.  -Continue Apriso 1.5 g qday. -Avoid NSAIDs or high dose aspirin  All questions were answered.      Cynthia Blazing, MD Gastroenterology and Hepatology [facility] Gastroenterology

## 2023-06-30 DIAGNOSIS — L738 Other specified follicular disorders: Secondary | ICD-10-CM | POA: Diagnosis not present

## 2023-06-30 DIAGNOSIS — R238 Other skin changes: Secondary | ICD-10-CM | POA: Diagnosis not present

## 2023-07-02 ENCOUNTER — Other Ambulatory Visit: Payer: Self-pay

## 2023-07-05 ENCOUNTER — Other Ambulatory Visit (HOSPITAL_COMMUNITY): Payer: Self-pay

## 2023-07-05 MED ORDER — AMOXICILLIN-POT CLAVULANATE 875-125 MG PO TABS
1.0000 | ORAL_TABLET | Freq: Two times a day (BID) | ORAL | 0 refills | Status: DC
  Filled 2023-07-05: qty 20, 10d supply, fill #0

## 2023-07-12 ENCOUNTER — Other Ambulatory Visit (HOSPITAL_COMMUNITY): Payer: Self-pay

## 2023-07-12 DIAGNOSIS — B019 Varicella without complication: Secondary | ICD-10-CM | POA: Diagnosis not present

## 2023-07-12 MED ORDER — VALACYCLOVIR HCL 1 G PO TABS
1000.0000 mg | ORAL_TABLET | Freq: Three times a day (TID) | ORAL | 0 refills | Status: DC
  Filled 2023-07-12: qty 15, 5d supply, fill #0

## 2023-07-13 ENCOUNTER — Other Ambulatory Visit: Payer: Self-pay

## 2023-07-22 ENCOUNTER — Other Ambulatory Visit (HOSPITAL_COMMUNITY): Payer: Self-pay

## 2023-07-22 DIAGNOSIS — Z6836 Body mass index (BMI) 36.0-36.9, adult: Secondary | ICD-10-CM | POA: Diagnosis not present

## 2023-07-22 DIAGNOSIS — Z01419 Encounter for gynecological examination (general) (routine) without abnormal findings: Secondary | ICD-10-CM | POA: Diagnosis not present

## 2023-07-22 DIAGNOSIS — L680 Hirsutism: Secondary | ICD-10-CM | POA: Diagnosis not present

## 2023-07-23 ENCOUNTER — Other Ambulatory Visit: Payer: Self-pay

## 2023-07-23 ENCOUNTER — Other Ambulatory Visit (HOSPITAL_COMMUNITY): Payer: Self-pay

## 2023-07-23 DIAGNOSIS — H5213 Myopia, bilateral: Secondary | ICD-10-CM | POA: Diagnosis not present

## 2023-07-23 MED ORDER — SPIRONOLACTONE 100 MG PO TABS
100.0000 mg | ORAL_TABLET | Freq: Every day | ORAL | 11 refills | Status: DC
  Filled 2023-07-23: qty 30, 30d supply, fill #0
  Filled 2023-08-11 – 2023-08-17 (×2): qty 30, 30d supply, fill #1
  Filled 2023-10-07: qty 30, 30d supply, fill #2
  Filled 2023-11-04: qty 30, 30d supply, fill #3

## 2023-07-23 MED ORDER — XIIDRA 5 % OP SOLN
1.0000 [drp] | Freq: Two times a day (BID) | OPHTHALMIC | 4 refills | Status: DC
  Filled 2023-07-23 (×2): qty 180, 90d supply, fill #0
  Filled 2023-08-11 – 2023-10-14 (×2): qty 180, 90d supply, fill #1

## 2023-07-26 ENCOUNTER — Other Ambulatory Visit (INDEPENDENT_AMBULATORY_CARE_PROVIDER_SITE_OTHER): Payer: Self-pay | Admitting: Gastroenterology

## 2023-07-26 ENCOUNTER — Other Ambulatory Visit (HOSPITAL_COMMUNITY): Payer: Self-pay

## 2023-07-27 ENCOUNTER — Other Ambulatory Visit: Payer: Self-pay

## 2023-07-27 ENCOUNTER — Other Ambulatory Visit (HOSPITAL_COMMUNITY): Payer: Self-pay

## 2023-07-27 MED ORDER — METOPROLOL SUCCINATE ER 50 MG PO TB24
50.0000 mg | ORAL_TABLET | Freq: Every day | ORAL | 5 refills | Status: AC
  Filled 2023-07-27: qty 30, 30d supply, fill #0
  Filled 2023-08-17 – 2023-08-24 (×2): qty 30, 30d supply, fill #1
  Filled 2023-09-16 – 2023-09-22 (×3): qty 30, 30d supply, fill #2
  Filled 2023-10-20: qty 30, 30d supply, fill #3
  Filled 2023-11-16 – 2023-11-17 (×2): qty 30, 30d supply, fill #4

## 2023-07-27 MED ORDER — MESALAMINE ER 0.375 G PO CP24
1.5000 g | ORAL_CAPSULE | Freq: Every day | ORAL | 3 refills | Status: DC
  Filled 2023-07-27: qty 120, 30d supply, fill #0
  Filled 2023-08-27: qty 120, 30d supply, fill #1
  Filled 2023-09-16 – 2023-09-22 (×3): qty 120, 30d supply, fill #2
  Filled 2023-11-04: qty 120, 30d supply, fill #3

## 2023-07-27 MED ORDER — BUSPIRONE HCL 10 MG PO TABS
10.0000 mg | ORAL_TABLET | Freq: Two times a day (BID) | ORAL | 0 refills | Status: DC
  Filled 2023-07-27: qty 60, 30d supply, fill #0

## 2023-07-27 MED ORDER — MOUNJARO 12.5 MG/0.5ML ~~LOC~~ SOAJ
12.5000 mg | SUBCUTANEOUS | 0 refills | Status: DC
  Filled 2023-07-27: qty 5, 70d supply, fill #0
  Filled 2023-08-11: qty 2, 28d supply, fill #0
  Filled 2023-09-11: qty 2, 28d supply, fill #1
  Filled 2023-10-07: qty 2, 28d supply, fill #2

## 2023-07-27 MED ORDER — DOXYCYCLINE HYCLATE 100 MG PO CAPS
100.0000 mg | ORAL_CAPSULE | Freq: Every day | ORAL | 1 refills | Status: DC
  Filled 2023-07-27: qty 30, 30d supply, fill #0
  Filled 2023-08-27: qty 30, 30d supply, fill #1

## 2023-07-27 NOTE — Telephone Encounter (Signed)
 Seen 06/28/23

## 2023-07-28 ENCOUNTER — Other Ambulatory Visit (HOSPITAL_COMMUNITY): Payer: Self-pay

## 2023-08-11 ENCOUNTER — Other Ambulatory Visit: Payer: Self-pay

## 2023-08-11 ENCOUNTER — Other Ambulatory Visit (HOSPITAL_COMMUNITY): Payer: Self-pay

## 2023-08-16 ENCOUNTER — Other Ambulatory Visit (HOSPITAL_COMMUNITY): Payer: Self-pay

## 2023-08-17 ENCOUNTER — Other Ambulatory Visit (HOSPITAL_COMMUNITY): Payer: Self-pay

## 2023-08-17 MED ORDER — BUSPIRONE HCL 10 MG PO TABS
10.0000 mg | ORAL_TABLET | Freq: Two times a day (BID) | ORAL | 0 refills | Status: DC
  Filled 2023-08-27: qty 60, 30d supply, fill #0

## 2023-08-18 ENCOUNTER — Other Ambulatory Visit: Payer: Self-pay

## 2023-08-18 ENCOUNTER — Other Ambulatory Visit (HOSPITAL_COMMUNITY): Payer: Self-pay

## 2023-08-19 ENCOUNTER — Other Ambulatory Visit (HOSPITAL_COMMUNITY): Payer: Self-pay

## 2023-08-19 ENCOUNTER — Other Ambulatory Visit: Payer: Self-pay

## 2023-08-24 DIAGNOSIS — D2239 Melanocytic nevi of other parts of face: Secondary | ICD-10-CM | POA: Diagnosis not present

## 2023-08-24 DIAGNOSIS — D2271 Melanocytic nevi of right lower limb, including hip: Secondary | ICD-10-CM | POA: Diagnosis not present

## 2023-08-24 DIAGNOSIS — D2272 Melanocytic nevi of left lower limb, including hip: Secondary | ICD-10-CM | POA: Diagnosis not present

## 2023-08-24 DIAGNOSIS — L905 Scar conditions and fibrosis of skin: Secondary | ICD-10-CM | POA: Diagnosis not present

## 2023-08-24 DIAGNOSIS — D225 Melanocytic nevi of trunk: Secondary | ICD-10-CM | POA: Diagnosis not present

## 2023-08-24 DIAGNOSIS — L821 Other seborrheic keratosis: Secondary | ICD-10-CM | POA: Diagnosis not present

## 2023-08-24 DIAGNOSIS — D2261 Melanocytic nevi of right upper limb, including shoulder: Secondary | ICD-10-CM | POA: Diagnosis not present

## 2023-08-24 DIAGNOSIS — L814 Other melanin hyperpigmentation: Secondary | ICD-10-CM | POA: Diagnosis not present

## 2023-08-24 DIAGNOSIS — D2262 Melanocytic nevi of left upper limb, including shoulder: Secondary | ICD-10-CM | POA: Diagnosis not present

## 2023-08-25 ENCOUNTER — Other Ambulatory Visit (HOSPITAL_COMMUNITY): Payer: Self-pay | Admitting: Obstetrics and Gynecology

## 2023-08-25 DIAGNOSIS — Z1231 Encounter for screening mammogram for malignant neoplasm of breast: Secondary | ICD-10-CM

## 2023-08-27 ENCOUNTER — Other Ambulatory Visit (HOSPITAL_COMMUNITY): Payer: Self-pay

## 2023-09-03 DIAGNOSIS — H5213 Myopia, bilateral: Secondary | ICD-10-CM | POA: Diagnosis not present

## 2023-09-03 DIAGNOSIS — H25013 Cortical age-related cataract, bilateral: Secondary | ICD-10-CM | POA: Diagnosis not present

## 2023-09-09 ENCOUNTER — Ambulatory Visit (HOSPITAL_COMMUNITY)
Admission: RE | Admit: 2023-09-09 | Discharge: 2023-09-09 | Disposition: A | Discharge status: Home / Self Care | Payer: Commercial Managed Care - PPO | Source: Ambulatory Visit | Attending: Obstetrics and Gynecology | Admitting: Obstetrics and Gynecology

## 2023-09-09 ENCOUNTER — Inpatient Hospital Stay (HOSPITAL_COMMUNITY): Admission: RE | Admit: 2023-09-09 | Payer: Commercial Managed Care - PPO | Source: Ambulatory Visit

## 2023-09-09 ENCOUNTER — Encounter (HOSPITAL_COMMUNITY): Payer: Self-pay

## 2023-09-09 DIAGNOSIS — Z1231 Encounter for screening mammogram for malignant neoplasm of breast: Secondary | ICD-10-CM | POA: Diagnosis not present

## 2023-09-11 ENCOUNTER — Other Ambulatory Visit (HOSPITAL_COMMUNITY): Payer: Self-pay

## 2023-09-11 ENCOUNTER — Other Ambulatory Visit: Payer: Self-pay

## 2023-09-13 ENCOUNTER — Other Ambulatory Visit: Payer: Self-pay

## 2023-09-13 ENCOUNTER — Other Ambulatory Visit (HOSPITAL_COMMUNITY): Payer: Self-pay

## 2023-09-13 MED ORDER — BUSPIRONE HCL 10 MG PO TABS
10.0000 mg | ORAL_TABLET | Freq: Two times a day (BID) | ORAL | 0 refills | Status: DC
  Filled 2023-09-13 – 2023-09-22 (×4): qty 60, 30d supply, fill #0

## 2023-09-16 ENCOUNTER — Other Ambulatory Visit (HOSPITAL_COMMUNITY): Payer: Self-pay

## 2023-09-16 ENCOUNTER — Other Ambulatory Visit: Payer: Self-pay

## 2023-09-16 MED ORDER — DOXYCYCLINE HYCLATE 100 MG PO CAPS
100.0000 mg | ORAL_CAPSULE | Freq: Every day | ORAL | 1 refills | Status: AC
  Filled 2023-09-22 (×2): qty 30, 30d supply, fill #0
  Filled 2023-10-30: qty 30, 30d supply, fill #1

## 2023-09-17 ENCOUNTER — Other Ambulatory Visit (HOSPITAL_COMMUNITY): Payer: Self-pay

## 2023-09-17 ENCOUNTER — Other Ambulatory Visit: Payer: Self-pay

## 2023-09-17 MED ORDER — DESVENLAFAXINE SUCCINATE ER 100 MG PO TB24
100.0000 mg | ORAL_TABLET | Freq: Every day | ORAL | 5 refills | Status: AC
  Filled 2023-09-17 – 2023-09-22 (×2): qty 30, 30d supply, fill #0
  Filled 2023-10-07 – 2023-10-17 (×3): qty 30, 30d supply, fill #1
  Filled 2023-11-16: qty 30, 30d supply, fill #2

## 2023-09-17 MED ORDER — CLONAZEPAM 0.5 MG PO TABS
0.5000 mg | ORAL_TABLET | Freq: Two times a day (BID) | ORAL | 0 refills | Status: DC
  Filled 2023-09-17 – 2023-09-22 (×2): qty 60, 30d supply, fill #0

## 2023-09-21 ENCOUNTER — Other Ambulatory Visit (HOSPITAL_COMMUNITY): Payer: Self-pay

## 2023-09-22 ENCOUNTER — Other Ambulatory Visit: Payer: Self-pay

## 2023-09-22 ENCOUNTER — Other Ambulatory Visit (HOSPITAL_COMMUNITY): Payer: Self-pay

## 2023-10-07 ENCOUNTER — Other Ambulatory Visit: Payer: Self-pay

## 2023-10-07 ENCOUNTER — Other Ambulatory Visit (HOSPITAL_COMMUNITY): Payer: Self-pay

## 2023-10-07 MED ORDER — MOUNJARO 12.5 MG/0.5ML ~~LOC~~ SOAJ
12.5000 mg | SUBCUTANEOUS | 0 refills | Status: DC
  Filled 2023-10-07: qty 4, 56d supply, fill #0

## 2023-10-08 ENCOUNTER — Other Ambulatory Visit (HOSPITAL_COMMUNITY): Payer: Self-pay

## 2023-10-08 DIAGNOSIS — H04123 Dry eye syndrome of bilateral lacrimal glands: Secondary | ICD-10-CM | POA: Diagnosis not present

## 2023-10-11 ENCOUNTER — Other Ambulatory Visit (HOSPITAL_COMMUNITY): Payer: Self-pay

## 2023-10-11 MED ORDER — MOUNJARO 12.5 MG/0.5ML ~~LOC~~ SOAJ
12.5000 mg | SUBCUTANEOUS | 0 refills | Status: AC
  Filled 2023-10-11 – 2023-10-14 (×2): qty 4, 56d supply, fill #0
  Filled 2023-10-17: qty 2, 28d supply, fill #0
  Filled 2023-10-30 – 2023-11-23 (×13): qty 4, 56d supply, fill #0

## 2023-10-12 ENCOUNTER — Other Ambulatory Visit (HOSPITAL_COMMUNITY): Payer: Self-pay

## 2023-10-15 ENCOUNTER — Other Ambulatory Visit: Payer: Self-pay

## 2023-10-15 ENCOUNTER — Other Ambulatory Visit (HOSPITAL_COMMUNITY): Payer: Self-pay

## 2023-10-17 ENCOUNTER — Other Ambulatory Visit (HOSPITAL_COMMUNITY): Payer: Self-pay

## 2023-10-18 ENCOUNTER — Other Ambulatory Visit: Payer: Self-pay

## 2023-10-18 ENCOUNTER — Other Ambulatory Visit (HOSPITAL_COMMUNITY): Payer: Self-pay

## 2023-10-18 MED ORDER — BUSPIRONE HCL 10 MG PO TABS
10.0000 mg | ORAL_TABLET | Freq: Two times a day (BID) | ORAL | 0 refills | Status: DC
  Filled 2023-10-18 – 2023-10-19 (×2): qty 60, 30d supply, fill #0

## 2023-10-19 ENCOUNTER — Other Ambulatory Visit (HOSPITAL_COMMUNITY): Payer: Self-pay

## 2023-10-20 ENCOUNTER — Other Ambulatory Visit (HOSPITAL_COMMUNITY): Payer: Self-pay

## 2023-10-20 ENCOUNTER — Telehealth: Payer: Commercial Managed Care - PPO | Admitting: Physician Assistant

## 2023-10-20 DIAGNOSIS — R3989 Other symptoms and signs involving the genitourinary system: Secondary | ICD-10-CM | POA: Diagnosis not present

## 2023-10-20 MED ORDER — CEPHALEXIN 500 MG PO CAPS: 500.0000 mg | ORAL_CAPSULE | Freq: Two times a day (BID) | ORAL | 0 refills | Status: AC

## 2023-10-20 NOTE — Progress Notes (Signed)
I have spent 5 minutes in review of e-visit questionnaire, review and updating patient chart, medical decision making and response to patient.   Mia Milan Cody Jacklynn Dehaas, PA-C    

## 2023-10-20 NOTE — Progress Notes (Signed)

## 2023-10-25 DIAGNOSIS — H2513 Age-related nuclear cataract, bilateral: Secondary | ICD-10-CM | POA: Diagnosis not present

## 2023-10-25 DIAGNOSIS — H04123 Dry eye syndrome of bilateral lacrimal glands: Secondary | ICD-10-CM | POA: Diagnosis not present

## 2023-11-01 ENCOUNTER — Other Ambulatory Visit: Payer: Self-pay

## 2023-11-02 ENCOUNTER — Other Ambulatory Visit: Payer: Self-pay

## 2023-11-04 ENCOUNTER — Other Ambulatory Visit (HOSPITAL_COMMUNITY): Payer: Self-pay

## 2023-11-04 ENCOUNTER — Other Ambulatory Visit: Payer: Self-pay

## 2023-11-04 MED ORDER — BUSPIRONE HCL 10 MG PO TABS
10.0000 mg | ORAL_TABLET | Freq: Two times a day (BID) | ORAL | 0 refills | Status: AC
  Filled 2023-11-04 – 2023-11-20 (×2): qty 60, 30d supply, fill #0

## 2023-11-05 ENCOUNTER — Other Ambulatory Visit (HOSPITAL_COMMUNITY): Payer: Self-pay

## 2023-11-06 ENCOUNTER — Other Ambulatory Visit: Payer: Self-pay

## 2023-11-09 ENCOUNTER — Other Ambulatory Visit: Payer: Self-pay

## 2023-11-10 ENCOUNTER — Other Ambulatory Visit (HOSPITAL_COMMUNITY): Payer: Self-pay

## 2023-11-11 ENCOUNTER — Other Ambulatory Visit: Payer: Self-pay

## 2023-11-12 ENCOUNTER — Other Ambulatory Visit: Payer: Self-pay

## 2023-11-13 ENCOUNTER — Other Ambulatory Visit (HOSPITAL_COMMUNITY): Payer: Self-pay

## 2023-11-15 ENCOUNTER — Other Ambulatory Visit (HOSPITAL_COMMUNITY): Payer: Self-pay

## 2023-11-15 ENCOUNTER — Other Ambulatory Visit (HOSPITAL_BASED_OUTPATIENT_CLINIC_OR_DEPARTMENT_OTHER): Payer: Self-pay

## 2023-11-15 MED ORDER — CLONAZEPAM 0.5 MG PO TABS
0.5000 mg | ORAL_TABLET | Freq: Two times a day (BID) | ORAL | 0 refills | Status: AC
  Filled 2023-11-15: qty 60, 30d supply, fill #0

## 2023-11-16 ENCOUNTER — Other Ambulatory Visit: Payer: Self-pay

## 2023-11-17 ENCOUNTER — Other Ambulatory Visit (HOSPITAL_COMMUNITY): Payer: Self-pay

## 2023-11-18 ENCOUNTER — Other Ambulatory Visit: Payer: Self-pay

## 2023-11-20 ENCOUNTER — Other Ambulatory Visit (HOSPITAL_COMMUNITY): Payer: Self-pay

## 2023-11-22 ENCOUNTER — Other Ambulatory Visit: Payer: Self-pay

## 2023-11-23 ENCOUNTER — Other Ambulatory Visit (HOSPITAL_COMMUNITY): Payer: Self-pay

## 2023-11-23 ENCOUNTER — Other Ambulatory Visit: Payer: Self-pay

## 2023-11-29 ENCOUNTER — Encounter (INDEPENDENT_AMBULATORY_CARE_PROVIDER_SITE_OTHER): Payer: Self-pay | Admitting: Gastroenterology

## 2023-11-29 ENCOUNTER — Other Ambulatory Visit (HOSPITAL_COMMUNITY): Payer: Self-pay

## 2023-11-29 ENCOUNTER — Other Ambulatory Visit (INDEPENDENT_AMBULATORY_CARE_PROVIDER_SITE_OTHER): Payer: Self-pay | Admitting: Gastroenterology

## 2023-11-29 DIAGNOSIS — K5720 Diverticulitis of large intestine with perforation and abscess without bleeding: Secondary | ICD-10-CM

## 2023-11-29 MED ORDER — MESALAMINE ER 0.375 G PO CP24: 1500.0000 mg | ORAL_CAPSULE | Freq: Every day | ORAL | 5 refills | Status: DC

## 2024-01-13 ENCOUNTER — Other Ambulatory Visit (HOSPITAL_COMMUNITY): Payer: Self-pay

## 2024-07-17 ENCOUNTER — Other Ambulatory Visit (HOSPITAL_COMMUNITY): Payer: Self-pay | Admitting: Obstetrics and Gynecology

## 2024-07-17 DIAGNOSIS — Z1231 Encounter for screening mammogram for malignant neoplasm of breast: Secondary | ICD-10-CM

## 2024-09-06 ENCOUNTER — Encounter (INDEPENDENT_AMBULATORY_CARE_PROVIDER_SITE_OTHER): Payer: Self-pay | Admitting: Gastroenterology

## 2024-09-11 ENCOUNTER — Ambulatory Visit (HOSPITAL_COMMUNITY)
Admission: RE | Admit: 2024-09-11 | Discharge: 2024-09-11 | Disposition: A | Discharge status: Home / Self Care | Source: Ambulatory Visit | Attending: Obstetrics and Gynecology | Admitting: Obstetrics and Gynecology

## 2024-09-11 ENCOUNTER — Encounter (HOSPITAL_COMMUNITY)

## 2024-09-11 DIAGNOSIS — Z1231 Encounter for screening mammogram for malignant neoplasm of breast: Secondary | ICD-10-CM

## 2024-10-04 ENCOUNTER — Other Ambulatory Visit: Payer: Self-pay

## 2024-10-04 ENCOUNTER — Encounter (HOSPITAL_COMMUNITY): Payer: Self-pay | Admitting: Obstetrics and Gynecology

## 2024-10-04 NOTE — Progress Notes (Signed)
 SDW call  Patient was given pre-op instructions over the phone. Patient verbalized understanding of instructions provided. Denies any SOB< fever or cough     PCP - Charmaine Heller, NP Cardiologist -  Pulmonary:    PPM/ICD - denies Device Orders - na Rep Notified - na   Chest x-ray - na EKG -  na Stress Test - ECHO - 01/30/2022 Cardiac Cath -   Sleep Study/sleep apnea/CPAP: denies  Type II diabetic. A1C 5.1 on 08/29/2024. Patient denies having diabetes. Does not check sugars Fasting Blood sugar range: na How often check sugars:na  GLP1: Mounjaro , states last dose 09/22/2024   Blood Thinner Instructions: denies Aspirin Instructions:denies   ERAS Protcol - NPO  Anesthesia review: No  Your procedure is scheduled on Friday October 06, 2024  Report to [facility] Main Entrance A at  0530 A.M., then check in with the Admitting office.  Call this number if you have problems the morning of surgery:  [phone]   If you have any questions prior to your surgery date call [phone]: Open Monday-Friday 8am-4pm If you experience any cold or flu symptoms such as cough, fever, chills, shortness of breath, etc. between now and your scheduled surgery, please notify us  at the above number    Remember:  Do not eat or drink after midnight the night before your surgery  Take these medicines the morning of surgery with A SIP OF WATER :  None  As of today, STOP taking any Aspirin (unless otherwise instructed by your surgeon) Aleve , Naproxen , Ibuprofen , Motrin , Advil , Goody's, BC's, all herbal medications, fish oil, and all vitamins.

## 2024-10-05 NOTE — Anesthesia Preprocedure Evaluation (Addendum)
 Anesthesia Evaluation  Patient identified by MRN, date of birth, ID band Patient awake    Reviewed: Allergy & Precautions, NPO status , Patient's Chart, lab work & pertinent test results  History of Anesthesia Complications Negative for: history of anesthetic complications  Airway Mallampati: I  TM Distance: >3 FB Neck ROM: Full    Dental no notable dental hx. (+) Teeth Intact, Dental Advisory Given   Pulmonary neg pulmonary ROS   Pulmonary exam normal breath sounds clear to auscultation       Cardiovascular hypertension, Pt. on medications and Pt. on home beta blockers (-) angina (-) Past MI Normal cardiovascular exam Rhythm:Regular Rate:Normal  2023 Nl Echo Ef 50-55%   Neuro/Psych  PSYCHIATRIC DISORDERS Anxiety        GI/Hepatic Neg liver ROS,,,  Endo/Other  diabetes  Pt on Monjaro for wt loss last 10/31 Hgb A1c 5.1  Renal/GU      Musculoskeletal   Abdominal   Peds  Hematology Lab Results      Component                Value               Date                      WBC                      7.8                 10/06/2024                HGB                      13.7                10/06/2024                HCT                      39.3                10/06/2024                MCV                      86.0                10/06/2024                PLT                      224                 10/06/2024              Anesthesia Other Findings   Reproductive/Obstetrics                              Anesthesia Physical Anesthesia Plan  ASA: 2  Anesthesia Plan: General   Post-op Pain Management: Tylenol  PO (pre-op)*, Precedex and Toradol  IV (intra-op)*   Induction: Intravenous  PONV Risk Score and Plan: 4 or greater and Ondansetron , Dexamethasone , Treatment may vary due to age or medical condition, Midazolam , Propofol  infusion and TIVA  Airway Management Planned: LMA  Additional  Equipment: None  Intra-op Plan:  Post-operative Plan: Extubation in OR  Informed Consent: I have reviewed the patients History and Physical, chart, labs and discussed the procedure including the risks, benefits and alternatives for the proposed anesthesia with the patient or authorized representative who has indicated his/her understanding and acceptance.     Dental advisory given  Plan Discussed with: CRNA and Surgeon  Anesthesia Plan Comments: (LMA TIVA)         Anesthesia Quick Evaluation

## 2024-10-06 ENCOUNTER — Encounter (HOSPITAL_COMMUNITY): Admission: RE | Disposition: A | Payer: Self-pay | Source: Home / Self Care | Attending: Obstetrics and Gynecology

## 2024-10-06 ENCOUNTER — Ambulatory Visit (HOSPITAL_COMMUNITY)
Admission: RE | Admit: 2024-10-06 | Discharge: 2024-10-06 | Disposition: A | Discharge status: Home / Self Care | Attending: Obstetrics and Gynecology | Admitting: Obstetrics and Gynecology

## 2024-10-06 ENCOUNTER — Ambulatory Visit (HOSPITAL_COMMUNITY): Admitting: Certified Registered Nurse Anesthetist

## 2024-10-06 ENCOUNTER — Ambulatory Visit (HOSPITAL_BASED_OUTPATIENT_CLINIC_OR_DEPARTMENT_OTHER): Admitting: Certified Registered Nurse Anesthetist

## 2024-10-06 ENCOUNTER — Other Ambulatory Visit: Payer: Self-pay

## 2024-10-06 DIAGNOSIS — E119 Type 2 diabetes mellitus without complications: Secondary | ICD-10-CM | POA: Insufficient documentation

## 2024-10-06 DIAGNOSIS — N750 Cyst of Bartholin's gland: Secondary | ICD-10-CM | POA: Diagnosis not present

## 2024-10-06 DIAGNOSIS — Z79899 Other long term (current) drug therapy: Secondary | ICD-10-CM | POA: Insufficient documentation

## 2024-10-06 DIAGNOSIS — I10 Essential (primary) hypertension: Secondary | ICD-10-CM | POA: Insufficient documentation

## 2024-10-06 DIAGNOSIS — Z7985 Long-term (current) use of injectable non-insulin antidiabetic drugs: Secondary | ICD-10-CM | POA: Diagnosis not present

## 2024-10-06 HISTORY — DX: Anxiety disorder, unspecified: F41.9

## 2024-10-06 HISTORY — PX: BARTHOLIN GLAND CYST EXCISION: SHX565

## 2024-10-06 HISTORY — DX: Type 2 diabetes mellitus without complications: E11.9

## 2024-10-06 LAB — CBC
HCT: 39.3 % (ref 36.0–46.0)
Hemoglobin: 13.7 g/dL (ref 12.0–15.0)
MCH: 30 pg (ref 26.0–34.0)
MCHC: 34.9 g/dL (ref 30.0–36.0)
MCV: 86 fL (ref 80.0–100.0)
Platelets: 224 K/uL (ref 150–400)
RBC: 4.57 MIL/uL (ref 3.87–5.11)
RDW: 13.4 % (ref 11.5–15.5)
WBC: 7.8 K/uL (ref 4.0–10.5)
nRBC: 0 % (ref 0.0–0.2)

## 2024-10-06 LAB — BASIC METABOLIC PANEL WITH GFR
Anion gap: 10 (ref 5–15)
BUN: 12 mg/dL (ref 6–20)
CO2: 22 mmol/L (ref 22–32)
Calcium: 8.7 mg/dL — ABNORMAL LOW (ref 8.9–10.3)
Chloride: 108 mmol/L (ref 98–111)
Creatinine, Ser: 0.81 mg/dL (ref 0.44–1.00)
GFR, Estimated: >60 mL/min (ref >60)
Glucose, Bld: 97 mg/dL (ref 70–99)
Potassium: 3.5 mmol/L (ref 3.5–5.1)
Sodium: 140 mmol/L (ref 135–145)

## 2024-10-06 LAB — TYPE AND SCREEN
ABO/RH(D): A POS
Antibody Screen: NEGATIVE

## 2024-10-06 LAB — GLUCOSE, CAPILLARY: Glucose-Capillary: 100 mg/dL — ABNORMAL HIGH (ref 70–99)

## 2024-10-06 LAB — POCT PREGNANCY, URINE: Preg Test, Ur: NEGATIVE

## 2024-10-06 SURGERY — EXCISION, BARTHOLIN'S GLAND
Anesthesia: General | Site: Perineum | Laterality: Right

## 2024-10-06 MED ORDER — EPHEDRINE 5 MG/ML INJ
INTRAVENOUS | Status: AC
  Filled 2024-10-06: qty 5

## 2024-10-06 MED ORDER — ONDANSETRON HCL 4 MG/2ML IJ SOLN
INTRAMUSCULAR | Status: AC
  Filled 2024-10-06: qty 2

## 2024-10-06 MED ORDER — MIDAZOLAM HCL 2 MG/2ML IJ SOLN
INTRAMUSCULAR | Status: AC
  Filled 2024-10-06: qty 2

## 2024-10-06 MED ORDER — ACETAMINOPHEN 500 MG PO TABS
1000.0000 mg | ORAL_TABLET | Freq: Once | ORAL | Status: AC
  Administered 2024-10-06: 1000 mg via ORAL
  Filled 2024-10-06: qty 2

## 2024-10-06 MED ORDER — MIDAZOLAM HCL (PF) 2 MG/2ML IJ SOLN
INTRAMUSCULAR | Status: DC | PRN
  Administered 2024-10-06: 2 mg via INTRAVENOUS

## 2024-10-06 MED ORDER — 0.9 % SODIUM CHLORIDE (POUR BTL) OPTIME
TOPICAL | Status: DC | PRN
  Administered 2024-10-06: 1000 mL

## 2024-10-06 MED ORDER — OXYCODONE HCL 5 MG/5ML PO SOLN: 5.0000 mg | Freq: Once | ORAL | Status: AC | PRN

## 2024-10-06 MED ORDER — BUPIVACAINE HCL (PF) 0.25 % IJ SOLN
INTRAMUSCULAR | Status: AC
  Filled 2024-10-06: qty 30

## 2024-10-06 MED ORDER — PROPOFOL 10 MG/ML IV BOLUS
INTRAVENOUS | Status: AC
  Filled 2024-10-06: qty 20

## 2024-10-06 MED ORDER — OXYCODONE HCL 5 MG PO TABS: 5.0000 mg | ORAL_TABLET | ORAL | Status: DC | PRN

## 2024-10-06 MED ORDER — OXYCODONE HCL 5 MG PO TABS
5.0000 mg | ORAL_TABLET | Freq: Once | ORAL | Status: AC | PRN
  Administered 2024-10-06: 5 mg via ORAL

## 2024-10-06 MED ORDER — POVIDONE-IODINE 10 % EX SWAB
2.0000 | Freq: Once | CUTANEOUS | Status: AC
  Administered 2024-10-06: 2 via TOPICAL

## 2024-10-06 MED ORDER — INSULIN ASPART 100 UNIT/ML IJ SOLN: 0.0000 [IU] | INTRAMUSCULAR | Status: DC | PRN

## 2024-10-06 MED ORDER — ONDANSETRON HCL 4 MG/2ML IJ SOLN
INTRAMUSCULAR | Status: DC | PRN
  Administered 2024-10-06: 4 mg via INTRAVENOUS

## 2024-10-06 MED ORDER — PROPOFOL 500 MG/50ML IV EMUL
INTRAVENOUS | Status: DC | PRN
  Administered 2024-10-06: 150 ug/kg/min via INTRAVENOUS

## 2024-10-06 MED ORDER — LACTATED RINGERS IV SOLN: INTRAVENOUS | Status: DC

## 2024-10-06 MED ORDER — OXYCODONE HCL 5 MG PO TABS: 5.0000 mg | ORAL_TABLET | ORAL | 0 refills | Status: AC | PRN

## 2024-10-06 MED ORDER — DEXAMETHASONE SOD PHOSPHATE PF 10 MG/ML IJ SOLN
INTRAMUSCULAR | Status: DC | PRN
  Administered 2024-10-06: 10 mg via INTRAVENOUS

## 2024-10-06 MED ORDER — KETOROLAC TROMETHAMINE 30 MG/ML IJ SOLN: 30.0000 mg | Freq: Once | INTRAMUSCULAR | Status: DC | PRN

## 2024-10-06 MED ORDER — OXYCODONE HCL 5 MG PO TABS
ORAL_TABLET | ORAL | Status: AC
  Filled 2024-10-06: qty 1

## 2024-10-06 MED ORDER — ORAL CARE MOUTH RINSE: 15.0000 mL | Freq: Once | OROMUCOSAL | Status: AC

## 2024-10-06 MED ORDER — LIDOCAINE 2% (20 MG/ML) 5 ML SYRINGE
INTRAMUSCULAR | Status: AC
  Filled 2024-10-06: qty 5

## 2024-10-06 MED ORDER — KETOROLAC TROMETHAMINE 30 MG/ML IJ SOLN
INTRAMUSCULAR | Status: AC
Start: 2024-10-06 — End: 2024-10-06
  Filled 2024-10-06: qty 1

## 2024-10-06 MED ORDER — KETOROLAC TROMETHAMINE 30 MG/ML IJ SOLN
INTRAMUSCULAR | Status: DC | PRN
  Administered 2024-10-06: 30 mg via INTRAVENOUS

## 2024-10-06 MED ORDER — MUPIROCIN 2 % EX OINT
TOPICAL_OINTMENT | CUTANEOUS | Status: DC | PRN
  Administered 2024-10-06: 1 via TOPICAL

## 2024-10-06 MED ORDER — DEXMEDETOMIDINE HCL IN NACL 80 MCG/20ML IV SOLN
INTRAVENOUS | Status: AC
Start: 2024-10-06 — End: 2024-10-06
  Filled 2024-10-06: qty 20

## 2024-10-06 MED ORDER — KETOROLAC TROMETHAMINE 30 MG/ML IJ SOLN
INTRAMUSCULAR | Status: AC
  Filled 2024-10-06: qty 1

## 2024-10-06 MED ORDER — MUPIROCIN 2 % EX OINT
TOPICAL_OINTMENT | CUTANEOUS | Status: AC
  Filled 2024-10-06: qty 22

## 2024-10-06 MED ORDER — PHENYLEPHRINE 80 MCG/ML (10ML) SYRINGE FOR IV PUSH (FOR BLOOD PRESSURE SUPPORT)
PREFILLED_SYRINGE | INTRAVENOUS | Status: AC
  Filled 2024-10-06: qty 10

## 2024-10-06 MED ORDER — PROPOFOL 10 MG/ML IV BOLUS
INTRAVENOUS | Status: AC
Start: 2024-10-06 — End: 2024-10-06
  Filled 2024-10-06: qty 20

## 2024-10-06 MED ORDER — LIDOCAINE 2% (20 MG/ML) 5 ML SYRINGE
INTRAMUSCULAR | Status: DC | PRN
  Administered 2024-10-06: 80 mg via INTRAVENOUS

## 2024-10-06 MED ORDER — HYDROMORPHONE HCL 1 MG/ML IJ SOLN
0.2500 mg | INTRAMUSCULAR | Status: DC | PRN
  Administered 2024-10-06 (×2): 0.5 mg via INTRAVENOUS

## 2024-10-06 MED ORDER — FENTANYL CITRATE (PF) 250 MCG/5ML IJ SOLN
INTRAMUSCULAR | Status: DC | PRN
Start: 2024-10-06 — End: 2024-10-06
  Administered 2024-10-06 (×4): 25 ug via INTRAVENOUS

## 2024-10-06 MED ORDER — FENTANYL CITRATE (PF) 100 MCG/2ML IJ SOLN
INTRAMUSCULAR | Status: AC
  Filled 2024-10-06: qty 2

## 2024-10-06 MED ORDER — DEXMEDETOMIDINE HCL IN NACL 80 MCG/20ML IV SOLN
INTRAVENOUS | Status: DC | PRN
  Administered 2024-10-06: 8 ug via INTRAVENOUS

## 2024-10-06 MED ORDER — CHLORHEXIDINE GLUCONATE 0.12 % MT SOLN
15.0000 mL | Freq: Once | OROMUCOSAL | Status: AC
  Administered 2024-10-06: 15 mL via OROMUCOSAL
  Filled 2024-10-06: qty 15

## 2024-10-06 MED ORDER — HYDROMORPHONE HCL 1 MG/ML IJ SOLN
INTRAMUSCULAR | Status: AC
  Filled 2024-10-06: qty 1

## 2024-10-06 MED ORDER — BUPIVACAINE HCL (PF) 0.25 % IJ SOLN
INTRAMUSCULAR | Status: DC | PRN
  Administered 2024-10-06: 5 mL

## 2024-10-06 MED ORDER — ONDANSETRON HCL 4 MG/2ML IJ SOLN: 4.0000 mg | Freq: Once | INTRAMUSCULAR | Status: DC | PRN

## 2024-10-06 MED ORDER — PROPOFOL 10 MG/ML IV BOLUS
INTRAVENOUS | Status: DC | PRN
  Administered 2024-10-06: 150 mg via INTRAVENOUS
  Administered 2024-10-06: 50 mg via INTRAVENOUS

## 2024-10-06 MED ORDER — SODIUM CHLORIDE 0.9 % IV SOLN
2.0000 g | INTRAVENOUS | Status: AC
  Administered 2024-10-06: 2 g via INTRAVENOUS
  Filled 2024-10-06: qty 2

## 2024-10-06 SURGICAL SUPPLY — 18 items
BLADE SURG 15 STRL LF DISP TIS (BLADE) ×2 IMPLANT
CNTNR URN SCR LID CUP LEK RST (MISCELLANEOUS) IMPLANT
COVER MAYO STAND STRL (DRAPES) ×2 IMPLANT
ELECTRODE REM PT RTRN 9FT ADLT (ELECTROSURGICAL) IMPLANT
GLOVE BIO SURGEON STRL SZ 6.5 (GLOVE) ×2 IMPLANT
GLOVE BIOGEL PI IND STRL 7.0 (GLOVE) ×2 IMPLANT
GOWN STRL REUS W/ TWL LRG LVL3 (GOWN DISPOSABLE) ×4 IMPLANT
PACK VAGINAL MINOR WOMEN LF (CUSTOM PROCEDURE TRAY) ×2 IMPLANT
PAD OB MATERNITY 11 LF (PERSONAL CARE ITEMS) ×2 IMPLANT
PENCIL BUTTON HOLSTER BLD 10FT (ELECTRODE) IMPLANT
SOLN 0.9% NACL POUR BTL 1000ML (IV SOLUTION) ×2 IMPLANT
SUT VIC AB 0 CT1 27XBRD ANBCTR (SUTURE) IMPLANT
SUT VIC AB 2-0 SH 27XBRD (SUTURE) IMPLANT
SUT VIC AB 3-0 PS2 18XBRD (SUTURE) ×6 IMPLANT
SUT VICRYL 0 UR6 27IN ABS (SUTURE) IMPLANT
TOWEL GREEN STERILE FF (TOWEL DISPOSABLE) ×4 IMPLANT
TUBE CONNECTING 12X1/4 (SUCTIONS) IMPLANT
YANKAUER SUCT BULB TIP NO VENT (SUCTIONS) IMPLANT

## 2024-10-06 NOTE — H&P (Signed)
 48 year old with chronic Bartholin's cyst. Has been treated with antibiotics.  Past Medical History:  Diagnosis Date   Abnormal Pap smear    Anxiety    Diabetes mellitus without complication (HCC)    Diverticulitis    Essential hypertension    Fibroid    Hx of campylobacteriosis 2005   Hx of Salmonella gastroenteritis    IBS (irritable bowel syndrome)    Past Surgical History:  Procedure Laterality Date   CARPAL TUNNEL RELEASE Left    CERVICAL CONIZATION W/BX N/A 09/07/2013   Procedure: CONIZATION CERVIX WITH BIOPSY/REMOVAL OF IUD AND INSERTION;  Surgeon: Rosaline LITTIE Cobble, MD;  Location: [facility];  Service: Gynecology;  Laterality: N/A;   COLONOSCOPY  2005 NUR   I/E HEMORRHOIDS, TIC AT HF, NL TI   COLONOSCOPY N/A 12/27/2015   Procedure: COLONOSCOPY;  Surgeon: Margo LITTIE Haddock, MD;  Location: [facility];  Service: Endoscopy;  Laterality: N/A;  0830   COLONOSCOPY WITH PROPOFOL  N/A 03/19/2022   Procedure: COLONOSCOPY WITH PROPOFOL ;  Surgeon: Golda Claudis PENNER, MD;  Location: [facility];  Service: Endoscopy;  Laterality: N/A;  145   COLPOSCOPY     CYSTECTOMY     abdomen   GYNECOLOGIC CRYOSURGERY     LAPAROSCOPIC APPENDECTOMY N/A 09/14/2016   Procedure: APPENDECTOMY LAPAROSCOPIC;  Surgeon: Oneil Budge, MD;  Location: [facility];  Service: General;  Laterality: N/A;   VAGINAL DELIVERY  04/07/2013   WISDOM TOOTH EXTRACTION     Hyzaar  [losartan  potassium-hctz], Ciprofloxacin , and Morphine  and codeine  Family History  Problem Relation Age of Onset   Hypertension Mother    Hypertension Father    Melanoma Paternal Uncle    Prader-Willi syndrome Brother    Diabetes Brother    Hypertension Brother    COPD Maternal Grandfather    COPD Paternal Grandmother    Social History   Socioeconomic History   Marital status: Married    Spouse name: Not on file   Number of children: Not on file   Years of education: 16   Highest education level: Not on file  Occupational History    Occupation: nurse  Tobacco Use   Smoking status: Never    Passive exposure: Never   Smokeless tobacco: Never  Vaping Use   Vaping status: Never Used  Substance and Sexual Activity   Alcohol use: No   Drug use: No   Sexual activity: Yes    Birth control/protection: I.U.D.  Other Topics Concern   Not on file  Social History Narrative   Not on file   Social Drivers of Health   Financial Resource Strain: Low Risk  (02/06/2024)   Received from [facility]   Overall Financial Resource Strain (CARDIA)    Difficulty of Paying Living Expenses: Not hard at all  Food Insecurity: No Food Insecurity (02/06/2024)   Received from [facility]   Hunger Vital Sign    Within the past 12 months, you worried that your food would run out before you got the money to buy more.: Never true    Within the past 12 months, the food you bought just didn't last and you didn't have money to get more.: Never true  Transportation Needs: No Transportation Needs (02/06/2024)   Received from [facility] - Transportation    Lack of Transportation (Medical): No    Lack of Transportation (Non-Medical): No  Physical Activity: Insufficiently Active (02/06/2024)   Received from [facility]   Exercise Vital Sign  On average, how many days per week do you engage in moderate to strenuous exercise (like a brisk walk)?: 2 days    On average, how many minutes do you engage in exercise at this level?: 30 min  Stress: No Stress Concern Present (02/06/2024)   Received from [facility] - Occupational Stress Questionnaire    Feeling of Stress : Only a little  Social Connections: Moderately Integrated (02/06/2024)   Received from [facility]   Social Network    How would you rate your social network (family, work, friends)?: Adequate participation with social networks   BP (!) (P) 146/91   Pulse (P) 70   Temp (P) 98 F (36.7 C) (Oral)   Resp (P) 18   Ht (P)  5' 7 (1.702 m)   Wt (P) 94.8 kg   LMP  (LMP Unknown)   SpO2 (P) 98%   BMI (P) 32.73 kg/m  Results for orders placed or performed during the hospital encounter of 10/06/24 (from the past 24 hours)  Glucose, capillary     Status: Abnormal   Collection Time: 10/06/24  5:55 AM  Result Value Ref Range   Glucose-Capillary 100 (H) 70 - 99 mg/dL  Type and screen     Status: None (Preliminary result)   Collection Time: 10/06/24  6:23 AM  Result Value Ref Range   ABO/RH(D) PENDING    Antibody Screen PENDING    Sample Expiration      10/09/2024,2359 Performed at [facility], [address], [place], [place] [address]   CBC     Status: None   Collection Time: 10/06/24  6:31 AM  Result Value Ref Range   WBC 7.8 4.0 - 10.5 K/uL   RBC 4.57 3.87 - 5.11 MIL/uL   Hemoglobin 13.7 12.0 - 15.0 g/dL   HCT 60.6 63.9 - 53.9 %   MCV 86.0 80.0 - 100.0 fL   MCH 30.0 26.0 - 34.0 pg   MCHC 34.9 30.0 - 36.0 g/dL   RDW 86.5 88.4 - 84.4 %   Platelets 224 150 - 400 K/uL   nRBC 0.0 0.0 - 0.2 %   General alert and oriented Lung CTAB Car RRR  Pelvic  EGBUS  Enlarged right Bartholin's cyst   IMPRESSION: Right Bartholin's cyst  PLAN: Removal of right Bartholin's cyst and gland Consent is signed

## 2024-10-06 NOTE — Anesthesia Postprocedure Evaluation (Signed)
 Anesthesia Post Note  Patient: Cynthia Ochoa  Procedure(s) Performed: EXCISION RIGHT BARTHOLIN'S CYST (Right: Perineum)     Patient location during evaluation: PACU Anesthesia Type: General Level of consciousness: awake and alert Pain management: pain level controlled Vital Signs Assessment: post-procedure vital signs reviewed and stable Respiratory status: spontaneous breathing, nonlabored ventilation, respiratory function stable and patient connected to nasal cannula oxygen Cardiovascular status: blood pressure returned to baseline and stable Postop Assessment: no apparent nausea or vomiting Anesthetic complications: no   No notable events documented.  Last Vitals:  Vitals:   10/06/24 0915 10/06/24 0930  BP: 116/76 110/77  Pulse: 63 67  Resp: 14 14  Temp:  36.5 C  SpO2: 98% 98%    Last Pain:  Vitals:   10/06/24 0930  TempSrc:   PainSc: 8                  Garnette DELENA Gab

## 2024-10-06 NOTE — Anesthesia Procedure Notes (Signed)
 Procedure Name: Intubation Date/Time: 10/06/2024 7:31 AM  Performed by: Delores Dus, CRNAPre-anesthesia Checklist: Patient identified, Emergency Drugs available, Suction available and Patient being monitored Patient Re-evaluated:Patient Re-evaluated prior to induction Oxygen Delivery Method: Circle system utilized Preoxygenation: Pre-oxygenation with 100% oxygen Induction Type: IV induction Ventilation: Mask ventilation without difficulty LMA Size: 4.0 Tube type: Oral Number of attempts: 2 Airway Equipment and Method: Stylet and Oral airway Placement Confirmation: ETT inserted through vocal cords under direct vision, positive ETCO2 and breath sounds checked- equal and bilateral Tube secured with: Tape Dental Injury: Teeth and Oropharynx as per pre-operative assessment

## 2024-10-06 NOTE — Brief Op Note (Signed)
 10/06/2024  8:14 AM  PATIENT:  Cynthia Ochoa  48 y.o. female  PRE-OPERATIVE DIAGNOSIS:  Cyst of right bartholin's gland  POST-OPERATIVE DIAGNOSIS:  Cyst of right bartholin's gland  PROCEDURE:  Procedure(s): EXCISION RIGHT BARTHOLIN'S CYST (Right)  SURGEON:  Surgeons and Role:    DEWAINE Mat Browning, MD - Primary  PHYSICIAN ASSISTANT:   ASSISTANTS: none   ANESTHESIA:   local, IV sedation, and MAC  EBL:  10 mL   BLOOD ADMINISTERED:none  DRAINS: none   LOCAL MEDICATIONS USED:  LIDOCAINE    SPECIMEN:  Source of Specimen:  right bartholin's cyst   DISPOSITION OF SPECIMEN:  PATHOLOGY  COUNTS:  YES  TOURNIQUET:  * No tourniquets in log *  DICTATION: .Other Dictation: Dictation Number dictated  PLAN OF CARE: Discharge to home after PACU  PATIENT DISPOSITION:  PACU - hemodynamically stable.   Delay start of Pharmacological VTE agent (>24hrs) due to surgical blood loss or risk of bleeding: not applicable

## 2024-10-06 NOTE — Transfer of Care (Signed)
 Immediate Anesthesia Transfer of Care Note  Patient: Cynthia Ochoa  Procedure(s) Performed: EXCISION RIGHT BARTHOLIN'S CYST (Right: Perineum)  Patient Location: PACU  Anesthesia Type:General  Level of Consciousness: awake, alert , and oriented  Airway & Oxygen Therapy: Patient Spontanous Breathing  Post-op Assessment: Report given to RN and Post -op Vital signs reviewed and stable  Post vital signs: Reviewed and stable  Last Vitals:  Vitals Value Taken Time  BP 108/71 10/06/24 08:19  Temp    Pulse 79 10/06/24 08:22  Resp 19 10/06/24 08:22  SpO2 99 % 10/06/24 08:22  Vitals shown include unfiled device data.  Last Pain:  Vitals:   10/06/24 0613  TempSrc:   PainSc: 0-No pain         Complications: No notable events documented.

## 2024-10-06 NOTE — Op Note (Signed)
 [name], Cynthia Ochoa MEDICAL RECORD NO: [id number] ACCOUNT NO: [id number] DATE OF BIRTH: 1976/11/12 FACILITY: [facility] LOCATION: [facility] PHYSICIAN: Jessilyn Catino L. Mat, MD  Operative Report   DATE OF PROCEDURE: 10/06/2024  PREOPERATIVE DIAGNOSIS:  Right Bartholin's gland cyst.  POSTOPERATIVE DIAGNOSIS:  Right Bartholin's gland cyst.  PROCEDURE:  Excision of right Bartholin cyst with gland.  SURGEON:  Mathieu Schloemer L. Mat, MD  ANESTHESIA:  MAC with local.  COMPLICATIONS:  None.  DRAINS:  None.  ESTIMATED BLOOD LOSS:  Less than 50 mL.  PATHOLOGY:  Right Bartholin's cyst wall sent to pathology.  DESCRIPTION OF PROCEDURE:  The patient was taken to the operating room after informed consent was obtained and she had been appropriately marked.  She was then prepped and draped in the usual sterile fashion after anesthesia was administered and a  timeout was performed.  An exam under anesthesia revealed an enlarged right Bartholin cyst. Local was infiltrated kind of over the bulging cyst wall on the inner aspect of the labia. A small linear incision was made with a scalpel and we then identified  the cyst wall bulging up through the incision.  I then grasped the cyst wall with an Allis clamp and we then used progressive sharp and blunt dissection and dissected the cyst wall free.  We also had to use the Bovie in order to maintain hemostasis. Once  the cyst wall was completely freed, it was sent to Pathology.  We then closed the wound in a series of 4 layers starting with 0 Vicryl in a series of running stitch and this was done in subsequent layers and then the third and fourth layer were closed  using 3-0 Vicryl.  The final layer was closed with a 3-0 Vicryl in subcuticular stitch. Hemostasis was very good. Bactroban was then applied to the wound.  Ice pack will be applied.  The patient was awoken from anesthesia and taken to the recovery room  in stable condition after all laps, sponge and  instrument counts were correct x2.   NIK D: 10/06/2024 8:18:37 am T: 10/06/2024 8:34:00 am  JOB: [id number]/ [id number]

## 2024-10-07 ENCOUNTER — Encounter (HOSPITAL_COMMUNITY): Payer: Self-pay | Admitting: Obstetrics and Gynecology

## 2024-10-09 LAB — SURGICAL PATHOLOGY

## 2024-10-23 ENCOUNTER — Other Ambulatory Visit (HOSPITAL_COMMUNITY): Payer: Self-pay

## 2024-10-23 ENCOUNTER — Other Ambulatory Visit: Payer: Self-pay

## 2024-10-23 MED ORDER — SPIRONOLACTONE 50 MG PO TABS
50.0000 mg | ORAL_TABLET | Freq: Every day | ORAL | 5 refills | Status: AC
  Filled 2024-10-23: qty 30, 30d supply, fill #0
  Filled 2024-11-14 – 2024-11-17 (×2): qty 30, 30d supply, fill #1
  Filled 2024-12-19: qty 90, 90d supply, fill #2

## 2024-10-23 MED ORDER — BUSPIRONE HCL 10 MG PO TABS
10.0000 mg | ORAL_TABLET | Freq: Two times a day (BID) | ORAL | 0 refills | Status: DC
  Filled 2024-10-23: qty 180, 90d supply, fill #0

## 2024-10-23 MED ORDER — DESVENLAFAXINE SUCCINATE ER 100 MG PO TB24
100.0000 mg | ORAL_TABLET | Freq: Every day | ORAL | 0 refills | Status: DC
  Filled 2024-10-23: qty 90, 90d supply, fill #0

## 2024-10-24 ENCOUNTER — Other Ambulatory Visit: Payer: Self-pay

## 2024-10-24 ENCOUNTER — Other Ambulatory Visit (HOSPITAL_COMMUNITY): Payer: Self-pay

## 2024-10-24 MED ORDER — CLONAZEPAM 0.5 MG PO TABS
0.5000 mg | ORAL_TABLET | Freq: Two times a day (BID) | ORAL | 2 refills | Status: AC
  Filled 2024-10-24: qty 60, 30d supply, fill #0

## 2024-10-24 MED ORDER — MOUNJARO 12.5 MG/0.5ML ~~LOC~~ SOAJ
12.5000 mg | SUBCUTANEOUS | 0 refills | Status: DC
  Filled 2024-10-24: qty 2, 28d supply, fill #0
  Filled 2024-11-14 – 2024-11-17 (×2): qty 2, 28d supply, fill #1

## 2024-10-24 MED ORDER — METOPROLOL SUCCINATE ER 50 MG PO TB24
50.0000 mg | ORAL_TABLET | Freq: Every day | ORAL | 4 refills | Status: AC
  Filled 2024-10-24: qty 30, 30d supply, fill #0
  Filled 2024-11-14 – 2024-11-17 (×2): qty 30, 30d supply, fill #1
  Filled 2024-12-21: qty 90, 90d supply, fill #2

## 2024-10-31 ENCOUNTER — Other Ambulatory Visit (INDEPENDENT_AMBULATORY_CARE_PROVIDER_SITE_OTHER): Payer: Self-pay | Admitting: Gastroenterology

## 2024-10-31 DIAGNOSIS — K5720 Diverticulitis of large intestine with perforation and abscess without bleeding: Secondary | ICD-10-CM

## 2024-11-13 ENCOUNTER — Other Ambulatory Visit (HOSPITAL_COMMUNITY): Payer: Self-pay

## 2024-11-14 ENCOUNTER — Other Ambulatory Visit (HOSPITAL_COMMUNITY): Payer: Self-pay

## 2024-11-15 ENCOUNTER — Other Ambulatory Visit (HOSPITAL_COMMUNITY): Payer: Self-pay

## 2024-11-15 MED ORDER — DESVENLAFAXINE SUCCINATE ER 100 MG PO TB24
100.0000 mg | ORAL_TABLET | Freq: Every day | ORAL | 0 refills | Status: AC
  Filled 2024-12-21: qty 90, 90d supply, fill #0

## 2024-11-15 MED ORDER — BUSPIRONE HCL 10 MG PO TABS
10.0000 mg | ORAL_TABLET | Freq: Two times a day (BID) | ORAL | 0 refills | Status: AC
  Filled 2024-11-15 – 2024-12-21 (×3): qty 180, 90d supply, fill #0

## 2024-11-17 ENCOUNTER — Other Ambulatory Visit: Payer: Self-pay

## 2024-12-05 ENCOUNTER — Other Ambulatory Visit (HOSPITAL_COMMUNITY): Payer: Self-pay

## 2024-12-19 ENCOUNTER — Other Ambulatory Visit (HOSPITAL_COMMUNITY): Payer: Self-pay

## 2024-12-20 ENCOUNTER — Other Ambulatory Visit (HOSPITAL_COMMUNITY): Payer: Self-pay

## 2024-12-20 ENCOUNTER — Other Ambulatory Visit: Payer: Self-pay

## 2024-12-20 MED ORDER — MOUNJARO 12.5 MG/0.5ML ~~LOC~~ SOAJ
12.5000 mg | SUBCUTANEOUS | 0 refills | Status: AC
  Filled 2024-12-20: qty 2, 28d supply, fill #0
  Filled 2024-12-21: qty 2, 28d supply, fill #1

## 2024-12-21 ENCOUNTER — Telehealth: Admitting: Emergency Medicine

## 2024-12-21 ENCOUNTER — Other Ambulatory Visit: Payer: Self-pay

## 2024-12-21 DIAGNOSIS — J019 Acute sinusitis, unspecified: Secondary | ICD-10-CM

## 2024-12-21 MED ORDER — AMOXICILLIN-POT CLAVULANATE 875-125 MG PO TABS: 1.0000 | ORAL_TABLET | Freq: Two times a day (BID) | ORAL | 0 refills | Status: AC

## 2024-12-21 NOTE — Progress Notes (Signed)
 E-Visit for Sinus Problems  We are sorry that you are not feeling well.  Here is how we plan to help!  Based on what you have shared with me it looks like you have sinusitis.  Sinusitis is inflammation and infection in the sinus cavities of the head.  Based on your presentation I believe you most likely have Acute Bacterial Sinusitis.  This is an infection caused by bacteria and is treated with antibiotics. I have prescribed Augmentin  875mg /125mg  one tablet twice daily with food, for 7 days. You may use an oral decongestant such as Mucinex D or if you have glaucoma or high blood pressure use plain Mucinex. Saline nasal spray help and can safely be used as often as needed for congestion.  If you develop worsening sinus pain, fever or notice severe headache and vision changes, or if symptoms are not better after completion of antibiotic, please schedule an appointment with a health care provider.    Sinus infections are not as easily transmitted as other respiratory infection, however we still recommend that you avoid close contact with loved ones, especially the very young and elderly.  Remember to wash your hands thoroughly throughout the day as this is the number one way to prevent the spread of infection!  Home Care: Only take medications as instructed by your medical team. Complete the entire course of an antibiotic. Do not take these medications with alcohol. A steam or ultrasonic humidifier can help congestion.  You can place a towel over your head and breathe in the steam from hot water coming from a faucet. Avoid close contacts especially the very young and the elderly. Cover your mouth when you cough or sneeze. Always remember to wash your hands.  Get Help Right Away If: You develop worsening fever or sinus pain. You develop a severe head ache or visual changes. Your symptoms persist after you have completed your treatment plan.  Make sure you Understand these instructions. Will  watch your condition. Will get help right away if you are not doing well or get worse.  Your e-visit answers were reviewed by a board certified advanced clinical practitioner to complete your personal care plan.  Depending on the condition, your plan could have included both over the counter or prescription medications.  If there is a problem please reply  once you have received a response from your provider.  Your safety is important to us .  If you have drug allergies check your prescription carefully.    You can use MyChart to ask questions about today's visit, request a non-urgent call back, or ask for a work or school excuse for 24 hours related to this e-Visit. If it has been greater than 24 hours you will need to follow up with your provider, or enter a new e-Visit to address those concerns.  You will get an e-mail in the next two days asking about your experience.  I hope that your e-visit has been valuable and will speed your recovery. Thank you for using e-visits.  I have spent 5 minutes in review of e-visit questionnaire, review and updating patient chart, medical decision making and response to patient.   Lamar Schlossman, PA-C
